# Patient Record
Sex: Male | Born: 1965 | Race: Black or African American | Hispanic: No | Marital: Married | Smoking: Former smoker
Health system: Southern US, Community
[De-identification: ages and names within clinical notes are randomized; demographics above are authoritative.]

## PROBLEM LIST (undated history)

## (undated) DIAGNOSIS — K219 Gastro-esophageal reflux disease without esophagitis: Secondary | ICD-10-CM

## (undated) DIAGNOSIS — Z5189 Encounter for other specified aftercare: Secondary | ICD-10-CM

## (undated) DIAGNOSIS — M1990 Unspecified osteoarthritis, unspecified site: Secondary | ICD-10-CM

## (undated) DIAGNOSIS — I10 Essential (primary) hypertension: Secondary | ICD-10-CM

## (undated) DIAGNOSIS — E119 Type 2 diabetes mellitus without complications: Secondary | ICD-10-CM

## (undated) DIAGNOSIS — K279 Peptic ulcer, site unspecified, unspecified as acute or chronic, without hemorrhage or perforation: Secondary | ICD-10-CM

## (undated) DIAGNOSIS — IMO0001 Reserved for inherently not codable concepts without codable children: Secondary | ICD-10-CM

## (undated) HISTORY — PX: CERVICAL FUSION: SHX112

## (undated) HISTORY — DX: Unspecified osteoarthritis, unspecified site: M19.90

## (undated) HISTORY — PX: HAMMER TOE SURGERY: SHX385

## (undated) HISTORY — PX: SPINE SURGERY: SHX786

## (undated) HISTORY — PX: ANTERIOR CERVICAL DECOMP/DISCECTOMY FUSION: SHX1161

## (undated) HISTORY — PX: BUNIONECTOMY: SHX129

## (undated) HISTORY — PX: CARPAL TUNNEL RELEASE: SHX101

## (undated) HISTORY — DX: Peptic ulcer, site unspecified, unspecified as acute or chronic, without hemorrhage or perforation: K27.9

---

## 2004-09-02 ENCOUNTER — Ambulatory Visit: Payer: Self-pay | Admitting: Endocrinology

## 2006-08-03 ENCOUNTER — Ambulatory Visit: Payer: Self-pay | Admitting: Physician Assistant

## 2006-10-13 ENCOUNTER — Ambulatory Visit: Payer: Self-pay | Admitting: Unknown Physician Specialty

## 2007-02-22 ENCOUNTER — Ambulatory Visit: Payer: Self-pay | Admitting: Physician Assistant

## 2007-12-27 ENCOUNTER — Ambulatory Visit: Payer: Self-pay | Admitting: Physician Assistant

## 2010-11-23 ENCOUNTER — Emergency Department: Payer: Self-pay | Admitting: Emergency Medicine

## 2010-11-26 ENCOUNTER — Emergency Department: Payer: Self-pay | Admitting: Internal Medicine

## 2011-11-18 ENCOUNTER — Emergency Department: Payer: Self-pay | Admitting: Emergency Medicine

## 2011-11-18 LAB — BASIC METABOLIC PANEL
BUN: 15 mg/dL (ref 7–18)
Calcium, Total: 8.5 mg/dL (ref 8.5–10.1)
EGFR (African American): >60
EGFR (Non-African Amer.): >60
Glucose: 105 mg/dL — ABNORMAL HIGH (ref 65–99)
Osmolality: 286 (ref 275–301)
Potassium: 3.8 mmol/L (ref 3.5–5.1)
Sodium: 143 mmol/L (ref 136–145)

## 2011-11-18 LAB — CBC
MCH: 32.9 pg (ref 26.0–34.0)
MCHC: 35.3 g/dL (ref 32.0–36.0)
Platelet: 254 10*3/uL (ref 150–440)
RDW: 12.8 % (ref 11.5–14.5)

## 2012-01-11 ENCOUNTER — Other Ambulatory Visit: Payer: Self-pay | Admitting: Neurological Surgery

## 2012-01-11 DIAGNOSIS — M25511 Pain in right shoulder: Secondary | ICD-10-CM

## 2012-01-11 DIAGNOSIS — M542 Cervicalgia: Secondary | ICD-10-CM

## 2012-01-17 ENCOUNTER — Other Ambulatory Visit: Payer: Self-pay

## 2012-01-17 ENCOUNTER — Inpatient Hospital Stay
Admission: RE | Admit: 2012-01-17 | Discharge: 2012-01-17 | Payer: Self-pay | Source: Ambulatory Visit | Attending: Neurological Surgery | Admitting: Neurological Surgery

## 2012-01-17 NOTE — Discharge Instructions (Signed)
Myelogram Discharge Instructions  1. Go home and rest quietly for the next 24 hours.  It is important to lie flat for the next 24 hours.  Get up only to go to the restroom.  You may lie in the bed or on a couch on your back, your stomach, your left side or your right side.  You may have one pillow under your head.  You may have pillows between your knees while you are on your side or under your knees while you are on your back.  2. DO NOT drive today.  Recline the seat as far back as it will go, while still wearing your seat belt, on the way home.  3. You may get up to go to the bathroom as needed.  You may sit up for 10 minutes to eat.  You may resume your normal diet and medications unless otherwise indicated.  Drink lots of extra fluids today and tomorrow.  4. The incidence of headache, nausea, or vomiting is about 5% (one in 20 patients).  If you develop a headache, lie flat and drink plenty of fluids until the headache goes away.  Caffeinated beverages may be helpful.  If you develop severe nausea and vomiting or a headache that does not go away with flat bed rest, call [phone].  5. You may resume normal activities after your 24 hours of bed rest is over; however, do not exert yourself strongly or do any heavy lifting tomorrow.  6. Call your physician for a follow-up appointment.  The results of your myelogram will be sent directly to your physician by the following day.  7. If you have any questions or if complications develop after you arrive home, please call [phone].  Discharge instructions have been explained to the patient.  The patient, or the person responsible for the patient, fully understands these instructions.

## 2012-01-28 ENCOUNTER — Ambulatory Visit
Admission: RE | Admit: 2012-01-28 | Discharge: 2012-01-28 | Disposition: A | Discharge status: Home / Self Care | Payer: BC Managed Care – PPO | Source: Ambulatory Visit | Attending: Neurological Surgery | Admitting: Neurological Surgery

## 2012-01-28 VITALS — BP 133/71 | HR 61 | Ht 71.0 in | Wt 190.0 lb

## 2012-01-28 DIAGNOSIS — M25511 Pain in right shoulder: Secondary | ICD-10-CM

## 2012-01-28 DIAGNOSIS — M542 Cervicalgia: Secondary | ICD-10-CM

## 2012-01-28 MED ORDER — MEPERIDINE HCL 100 MG/ML IJ SOLN
75.0000 mg | Freq: Once | INTRAMUSCULAR | Status: AC
  Administered 2012-01-28: 75 mg via INTRAMUSCULAR

## 2012-01-28 MED ORDER — ONDANSETRON HCL 4 MG/2ML IJ SOLN
4.0000 mg | Freq: Once | INTRAMUSCULAR | Status: AC
  Administered 2012-01-28: 4 mg via INTRAMUSCULAR

## 2012-01-28 MED ORDER — DIAZEPAM 5 MG PO TABS
10.0000 mg | ORAL_TABLET | Freq: Once | ORAL | Status: AC
  Administered 2012-01-28: 10 mg via ORAL

## 2012-01-28 MED ORDER — IOHEXOL 300 MG/ML  SOLN: 10.0000 mL | Freq: Once | INTRAMUSCULAR | Status: AC | PRN

## 2012-01-28 NOTE — Discharge Instructions (Signed)

## 2012-01-28 NOTE — Progress Notes (Signed)
Patient resting quietly without complaint with wife at bedside in nursing station after myelogram.  Tolerating snack well.  jkl

## 2012-02-11 ENCOUNTER — Other Ambulatory Visit: Payer: Self-pay | Admitting: Neurological Surgery

## 2012-03-02 ENCOUNTER — Encounter (HOSPITAL_COMMUNITY): Payer: Self-pay | Admitting: Pharmacy Technician

## 2012-03-08 ENCOUNTER — Inpatient Hospital Stay (HOSPITAL_COMMUNITY): Admission: RE | Admit: 2012-03-08 | Discharge: 2012-03-08 | Payer: BC Managed Care – PPO | Source: Ambulatory Visit

## 2012-03-08 ENCOUNTER — Encounter (HOSPITAL_COMMUNITY): Payer: Self-pay

## 2012-03-08 HISTORY — DX: Encounter for other specified aftercare: Z51.89

## 2012-03-08 HISTORY — DX: Gastro-esophageal reflux disease without esophagitis: K21.9

## 2012-03-08 HISTORY — DX: Reserved for inherently not codable concepts without codable children: IMO0001

## 2012-03-08 NOTE — Pre-Procedure Instructions (Signed)
20 Urias Sheek  03/08/2012   Your procedure is scheduled on: 03-16-2012 @7 :30 AM Report to [facility] at5:30 AM AM.  Call this number if you have problems the morning of surgery: [phone]   Remember:   Do not eat food:After Midnight.  May have clear liquids: up to 4 Hours before arrival.until 1:30 AM  Clear liquids include soda, tea, black coffee, apple or grape juice, broth.  Take these medicines the morning of surgery with A SIP OF WATER:   Do not wear jewelry, make-up or nail polish.  Do not wear lotions, powders, or perfumes. You may wear deodorant.  Do not shave 48 hours prior to surgery. Men may shave face and neck.  Do not bring valuables to the hospital.  Contacts, dentures or bridgework may not be worn into surgery.  Leave suitcase in the car. After surgery it may be brought to your room.  For patients admitted to the hospital, checkout time is 11:00 AM the day of discharge.      Special Instructions: CHG Shower Use Special Wash: 1/2 bottle night before surgery and 1/2 bottle morning of surgery.   Please read over the following fact sheets that you were given: Pain Booklet, Coughing and Deep Breathing, MRSA Information and Surgical Site Infection Prevention

## 2012-03-08 NOTE — Progress Notes (Signed)
EKG,CXR requested from [facility], Stress test,ECHO requested from Dr. Eric Form  Phone [phone]

## 2012-03-09 ENCOUNTER — Encounter (HOSPITAL_COMMUNITY)
Admission: RE | Admit: 2012-03-09 | Discharge: 2012-03-09 | Disposition: A | Discharge status: Home / Self Care | Payer: BC Managed Care – PPO | Source: Ambulatory Visit | Attending: Neurological Surgery | Admitting: Neurological Surgery

## 2012-03-09 LAB — BASIC METABOLIC PANEL
Calcium: 9.5 mg/dL (ref 8.4–10.5)
GFR calc Af Amer: >90 mL/min (ref >90)
GFR calc non Af Amer: >90 mL/min (ref >90)
Potassium: 4.2 mEq/L (ref 3.5–5.1)
Sodium: 141 mEq/L (ref 135–145)

## 2012-03-09 LAB — CBC
Hemoglobin: 15.2 g/dL (ref 13.0–17.0)
MCH: 32.2 pg (ref 26.0–34.0)
Platelets: 249 10*3/uL (ref 150–400)
RBC: 4.72 MIL/uL (ref 4.22–5.81)
WBC: 5.6 10*3/uL (ref 4.0–10.5)

## 2012-03-09 LAB — SURGICAL PCR SCREEN: Staphylococcus aureus: NEGATIVE

## 2012-03-09 LAB — PROTIME-INR
INR: 1 (ref 0.00–1.49)
Prothrombin Time: 13.4 seconds (ref 11.6–15.2)

## 2012-03-10 NOTE — Consult Note (Signed)
Anesthesia:  Patient is a 46 year old male scheduled for C6-7 posterior cervical fusion on 03/16/12.  History includes former smoker, GERD, blood transfusion, prior cervical fusion.    Labs noted.    EKG from 03/09/12 showed NSR, right BBB.  He has been evaluated by Cardiologist Dr. Corky Downs in [place] in the past, last visit 12/28/11.  According to 04/28/11 stress test comments, he had a known right BBB at that time.  He had a exercise stress test on 04/28/11 that was negative for angina, arrhythmia, and ischemic ST changes.  Echo on 04/14/11 showed LV/RV dimensions, wall thickness, and global function WNL, LA/RA normal, no significant valvular disease.  Plan to proceed.  Shonna Chock, PA-C

## 2012-03-15 MED ORDER — CEFAZOLIN SODIUM-DEXTROSE 2-3 GM-% IV SOLR
2.0000 g | INTRAVENOUS | Status: AC
  Administered 2012-03-16: 2 g via INTRAVENOUS
  Filled 2012-03-15: qty 50

## 2012-03-15 MED ORDER — DEXAMETHASONE SODIUM PHOSPHATE 10 MG/ML IJ SOLN
10.0000 mg | INTRAMUSCULAR | Status: AC
  Administered 2012-03-16: 10 mg via INTRAVENOUS
  Filled 2012-03-15: qty 1

## 2012-03-15 MED ORDER — CEFAZOLIN SODIUM 1-5 GM-% IV SOLN: 1.0000 g | INTRAVENOUS | Status: DC

## 2012-03-16 ENCOUNTER — Ambulatory Visit (HOSPITAL_COMMUNITY): Payer: BC Managed Care – PPO | Admitting: Vascular Surgery

## 2012-03-16 ENCOUNTER — Ambulatory Visit (HOSPITAL_COMMUNITY): Payer: BC Managed Care – PPO

## 2012-03-16 ENCOUNTER — Encounter (HOSPITAL_COMMUNITY)
Admission: RE | Disposition: A | Discharge status: Home / Self Care | Payer: Self-pay | Source: Ambulatory Visit | Attending: Neurological Surgery

## 2012-03-16 ENCOUNTER — Ambulatory Visit (HOSPITAL_COMMUNITY)
Admission: RE | Admit: 2012-03-16 | Discharge: 2012-03-17 | Disposition: A | Discharge status: Home / Self Care | Payer: BC Managed Care – PPO | Source: Ambulatory Visit | Attending: Neurological Surgery | Admitting: Neurological Surgery

## 2012-03-16 ENCOUNTER — Encounter (HOSPITAL_COMMUNITY): Payer: Self-pay | Admitting: *Deleted

## 2012-03-16 ENCOUNTER — Encounter (HOSPITAL_COMMUNITY): Payer: Self-pay | Admitting: Vascular Surgery

## 2012-03-16 DIAGNOSIS — Z01810 Encounter for preprocedural cardiovascular examination: Secondary | ICD-10-CM | POA: Insufficient documentation

## 2012-03-16 DIAGNOSIS — Z01818 Encounter for other preprocedural examination: Secondary | ICD-10-CM | POA: Insufficient documentation

## 2012-03-16 DIAGNOSIS — T84498A Other mechanical complication of other internal orthopedic devices, implants and grafts, initial encounter: Secondary | ICD-10-CM | POA: Insufficient documentation

## 2012-03-16 DIAGNOSIS — K219 Gastro-esophageal reflux disease without esophagitis: Secondary | ICD-10-CM | POA: Insufficient documentation

## 2012-03-16 DIAGNOSIS — Y838 Other surgical procedures as the cause of abnormal reaction of the patient, or of later complication, without mention of misadventure at the time of the procedure: Secondary | ICD-10-CM | POA: Insufficient documentation

## 2012-03-16 HISTORY — PX: POSTERIOR CERVICAL FUSION/FORAMINOTOMY: SHX5038

## 2012-03-16 SURGERY — POSTERIOR CERVICAL FUSION/FORAMINOTOMY LEVEL 1
Anesthesia: General | Site: Neck | Wound class: Clean

## 2012-03-16 MED ORDER — ROCURONIUM BROMIDE 100 MG/10ML IV SOLN
INTRAVENOUS | Status: DC | PRN
  Administered 2012-03-16: 50 mg via INTRAVENOUS
  Administered 2012-03-16: 10 mg via INTRAVENOUS

## 2012-03-16 MED ORDER — HYDROMORPHONE HCL PF 1 MG/ML IJ SOLN
INTRAMUSCULAR | Status: AC
  Filled 2012-03-16: qty 1

## 2012-03-16 MED ORDER — ACETAMINOPHEN 650 MG RE SUPP: 650.0000 mg | RECTAL | Status: DC | PRN

## 2012-03-16 MED ORDER — MENTHOL 3 MG MT LOZG: 1.0000 | LOZENGE | OROMUCOSAL | Status: DC | PRN

## 2012-03-16 MED ORDER — CEFAZOLIN SODIUM 1-5 GM-% IV SOLN
1.0000 g | Freq: Three times a day (TID) | INTRAVENOUS | Status: AC
  Administered 2012-03-16 (×2): 1 g via INTRAVENOUS
  Filled 2012-03-16 (×2): qty 50

## 2012-03-16 MED ORDER — CYCLOBENZAPRINE HCL 10 MG PO TABS
10.0000 mg | ORAL_TABLET | Freq: Three times a day (TID) | ORAL | Status: DC | PRN
  Administered 2012-03-16 – 2012-03-17 (×3): 10 mg via ORAL
  Filled 2012-03-16 (×5): qty 1

## 2012-03-16 MED ORDER — PROPOFOL 10 MG/ML IV EMUL
INTRAVENOUS | Status: DC | PRN
  Administered 2012-03-16: 150 mg via INTRAVENOUS
  Administered 2012-03-16: 50 mg via INTRAVENOUS

## 2012-03-16 MED ORDER — SODIUM CHLORIDE 0.9 % IJ SOLN
3.0000 mL | Freq: Two times a day (BID) | INTRAMUSCULAR | Status: DC
  Administered 2012-03-16 – 2012-03-17 (×2): 3 mL via INTRAVENOUS

## 2012-03-16 MED ORDER — SODIUM CHLORIDE 0.9 % IR SOLN
Status: DC | PRN
  Administered 2012-03-16: 09:00:00

## 2012-03-16 MED ORDER — MEPERIDINE HCL 25 MG/ML IJ SOLN: 6.2500 mg | INTRAMUSCULAR | Status: DC | PRN

## 2012-03-16 MED ORDER — PROMETHAZINE HCL 25 MG/ML IJ SOLN: 6.2500 mg | INTRAMUSCULAR | Status: DC | PRN

## 2012-03-16 MED ORDER — FENTANYL CITRATE 0.05 MG/ML IJ SOLN
INTRAMUSCULAR | Status: DC | PRN
  Administered 2012-03-16 (×2): 100 ug via INTRAVENOUS
  Administered 2012-03-16 (×3): 50 ug via INTRAVENOUS
  Administered 2012-03-16: 100 ug via INTRAVENOUS

## 2012-03-16 MED ORDER — MIDAZOLAM HCL 2 MG/2ML IJ SOLN
INTRAMUSCULAR | Status: AC
  Filled 2012-03-16: qty 2

## 2012-03-16 MED ORDER — SODIUM CHLORIDE 0.9 % IJ SOLN: 3.0000 mL | INTRAMUSCULAR | Status: DC | PRN

## 2012-03-16 MED ORDER — DEXAMETHASONE SODIUM PHOSPHATE 4 MG/ML IJ SOLN
4.0000 mg | Freq: Four times a day (QID) | INTRAMUSCULAR | Status: DC
  Administered 2012-03-16 – 2012-03-17 (×2): 4 mg via INTRAVENOUS
  Filled 2012-03-16 (×6): qty 1

## 2012-03-16 MED ORDER — HEMOSTATIC AGENTS (NO CHARGE) OPTIME
TOPICAL | Status: DC | PRN
  Administered 2012-03-16: 1 via TOPICAL

## 2012-03-16 MED ORDER — LACTATED RINGERS IV SOLN
INTRAVENOUS | Status: DC | PRN
  Administered 2012-03-16 (×2): via INTRAVENOUS

## 2012-03-16 MED ORDER — MIDAZOLAM HCL 2 MG/2ML IJ SOLN
0.5000 mg | Freq: Once | INTRAMUSCULAR | Status: AC | PRN
  Administered 2012-03-16: 1 mg via INTRAVENOUS

## 2012-03-16 MED ORDER — CELECOXIB 200 MG PO CAPS
200.0000 mg | ORAL_CAPSULE | Freq: Two times a day (BID) | ORAL | Status: DC
  Administered 2012-03-16 – 2012-03-17 (×3): 200 mg via ORAL
  Filled 2012-03-16 (×4): qty 1

## 2012-03-16 MED ORDER — NEOSTIGMINE METHYLSULFATE 1 MG/ML IJ SOLN
INTRAMUSCULAR | Status: DC | PRN
  Administered 2012-03-16: 4 mg via INTRAVENOUS

## 2012-03-16 MED ORDER — HYDROCODONE-ACETAMINOPHEN 5-325 MG PO TABS
1.0000 | ORAL_TABLET | ORAL | Status: DC | PRN
  Administered 2012-03-16 – 2012-03-17 (×4): 2 via ORAL
  Filled 2012-03-16 (×4): qty 2

## 2012-03-16 MED ORDER — ONDANSETRON HCL 4 MG/2ML IJ SOLN: 4.0000 mg | INTRAMUSCULAR | Status: DC | PRN

## 2012-03-16 MED ORDER — HYDROMORPHONE HCL PF 1 MG/ML IJ SOLN
0.5000 mg | INTRAMUSCULAR | Status: DC | PRN
  Administered 2012-03-16 (×3): 0.5 mg via INTRAVENOUS

## 2012-03-16 MED ORDER — ZOLPIDEM TARTRATE 5 MG PO TABS: 10.0000 mg | ORAL_TABLET | Freq: Every evening | ORAL | Status: DC | PRN

## 2012-03-16 MED ORDER — ACETAMINOPHEN 325 MG PO TABS
650.0000 mg | ORAL_TABLET | ORAL | Status: DC | PRN
  Filled 2012-03-16: qty 2

## 2012-03-16 MED ORDER — SODIUM CHLORIDE 0.9 % IV SOLN: 250.0000 mL | INTRAVENOUS | Status: DC

## 2012-03-16 MED ORDER — ONDANSETRON HCL 4 MG/2ML IJ SOLN
INTRAMUSCULAR | Status: DC | PRN
  Administered 2012-03-16: 4 mg via INTRAVENOUS

## 2012-03-16 MED ORDER — LIDOCAINE HCL (CARDIAC) 20 MG/ML IV SOLN
INTRAVENOUS | Status: DC | PRN
  Administered 2012-03-16: 40 mg via INTRAVENOUS

## 2012-03-16 MED ORDER — MIDAZOLAM HCL 5 MG/5ML IJ SOLN
INTRAMUSCULAR | Status: DC | PRN
  Administered 2012-03-16: 2 mg via INTRAVENOUS

## 2012-03-16 MED ORDER — HYDROMORPHONE HCL PF 1 MG/ML IJ SOLN
0.5000 mg | INTRAMUSCULAR | Status: DC | PRN
  Administered 2012-03-16 (×3): 1 mg via INTRAVENOUS
  Filled 2012-03-16 (×3): qty 1

## 2012-03-16 MED ORDER — BUPIVACAINE HCL (PF) 0.25 % IJ SOLN
INTRAMUSCULAR | Status: DC | PRN
  Administered 2012-03-16: 5 mL

## 2012-03-16 MED ORDER — GLYCOPYRROLATE 0.2 MG/ML IJ SOLN
INTRAMUSCULAR | Status: DC | PRN
  Administered 2012-03-16: .8 mg via INTRAVENOUS

## 2012-03-16 MED ORDER — POTASSIUM CHLORIDE IN NACL 20-0.9 MEQ/L-% IV SOLN
INTRAVENOUS | Status: DC
  Filled 2012-03-16 (×4): qty 1000

## 2012-03-16 MED ORDER — BACITRACIN 50000 UNITS IM SOLR
INTRAMUSCULAR | Status: AC
  Filled 2012-03-16: qty 1

## 2012-03-16 MED ORDER — DEXAMETHASONE 4 MG PO TABS
4.0000 mg | ORAL_TABLET | Freq: Four times a day (QID) | ORAL | Status: DC
  Administered 2012-03-16 – 2012-03-17 (×3): 4 mg via ORAL
  Filled 2012-03-16 (×5): qty 1

## 2012-03-16 MED ORDER — HYDROMORPHONE HCL PF 1 MG/ML IJ SOLN
0.2500 mg | INTRAMUSCULAR | Status: DC | PRN
  Administered 2012-03-16 (×4): 0.5 mg via INTRAVENOUS

## 2012-03-16 MED ORDER — THROMBIN 5000 UNITS EX KIT
PACK | CUTANEOUS | Status: DC | PRN
  Administered 2012-03-16 (×2): 5000 [IU] via TOPICAL

## 2012-03-16 MED ORDER — SENNA 8.6 MG PO TABS
1.0000 | ORAL_TABLET | Freq: Two times a day (BID) | ORAL | Status: DC
  Administered 2012-03-16 – 2012-03-17 (×3): 8.6 mg via ORAL
  Filled 2012-03-16 (×4): qty 1

## 2012-03-16 MED ORDER — PHENOL 1.4 % MT LIQD: 1.0000 | OROMUCOSAL | Status: DC | PRN

## 2012-03-16 MED ORDER — SODIUM CHLORIDE 0.9 % IV SOLN
INTRAVENOUS | Status: AC
  Filled 2012-03-16: qty 500

## 2012-03-16 MED ORDER — 0.9 % SODIUM CHLORIDE (POUR BTL) OPTIME
TOPICAL | Status: DC | PRN
  Administered 2012-03-16: 1000 mL

## 2012-03-16 SURGICAL SUPPLY — 55 items
BAG DECANTER FOR FLEXI CONT (MISCELLANEOUS) ×2 IMPLANT
BENZOIN TINCTURE PRP APPL 2/3 (GAUZE/BANDAGES/DRESSINGS) ×2 IMPLANT
BIT DRILL MOUNTAINER FXED 12MM (DRILL) ×1 IMPLANT
BLADE SURG ROTATE 9660 (MISCELLANEOUS) IMPLANT
BUR MATCHSTICK NEURO 3.0 LAGG (BURR) IMPLANT
CANISTER SUCTION 2500CC (MISCELLANEOUS) ×2 IMPLANT
CLOTH BEACON ORANGE TIMEOUT ST (SAFETY) ×2 IMPLANT
CONT SPEC 4OZ CLIKSEAL STRL BL (MISCELLANEOUS) ×2 IMPLANT
DRAPE C-ARM 42X72 X-RAY (DRAPES) ×4 IMPLANT
DRAPE LAPAROTOMY 100X72 PEDS (DRAPES) ×2 IMPLANT
DRAPE POUCH INSTRU U-SHP 10X18 (DRAPES) ×2 IMPLANT
DRESSING TELFA 8X3 (GAUZE/BANDAGES/DRESSINGS) ×2 IMPLANT
DRILL MOUNTAINEER FIXED 12MM (DRILL) ×2
DRSG OPSITE 4X5.5 SM (GAUZE/BANDAGES/DRESSINGS) ×2 IMPLANT
DURAPREP 26ML APPLICATOR (WOUND CARE) ×2 IMPLANT
ELECT REM PT RETURN 9FT ADLT (ELECTROSURGICAL) ×2
ELECTRODE REM PT RTRN 9FT ADLT (ELECTROSURGICAL) ×1 IMPLANT
EVACUATOR 1/8 PVC DRAIN (DRAIN) IMPLANT
GAUZE SPONGE 4X4 16PLY XRAY LF (GAUZE/BANDAGES/DRESSINGS) IMPLANT
GLOVE BIO SURGEON STRL SZ8 (GLOVE) ×2 IMPLANT
GLOVE BIOGEL PI IND STRL 7.0 (GLOVE) ×1 IMPLANT
GLOVE BIOGEL PI IND STRL 7.5 (GLOVE) ×1 IMPLANT
GLOVE BIOGEL PI INDICATOR 7.0 (GLOVE) ×1
GLOVE BIOGEL PI INDICATOR 7.5 (GLOVE) ×1
GLOVE SURG SS PI 7.0 STRL IVOR (GLOVE) ×4 IMPLANT
GLOVE SURG SS PI 7.5 STRL IVOR (GLOVE) ×4 IMPLANT
GOWN BRE IMP SLV AUR LG STRL (GOWN DISPOSABLE) IMPLANT
GOWN BRE IMP SLV AUR XL STRL (GOWN DISPOSABLE) IMPLANT
GOWN STRL REIN 2XL LVL4 (GOWN DISPOSABLE) IMPLANT
HEMOSTAT POWDER KIT SURGIFOAM (HEMOSTASIS) IMPLANT
KIT BASIN OR (CUSTOM PROCEDURE TRAY) ×2 IMPLANT
KIT ROOM TURNOVER OR (KITS) ×2 IMPLANT
MARKER SKIN DUAL TIP RULER LAB (MISCELLANEOUS) ×2 IMPLANT
NEEDLE HYPO 25X1 1.5 SAFETY (NEEDLE) ×2 IMPLANT
NEEDLE SPNL 20GX3.5 QUINCKE YW (NEEDLE) ×2 IMPLANT
NS IRRIG 1000ML POUR BTL (IV SOLUTION) ×2 IMPLANT
PACK LAMINECTOMY NEURO (CUSTOM PROCEDURE TRAY) ×2 IMPLANT
PAD ARMBOARD 7.5X6 YLW CONV (MISCELLANEOUS) ×2 IMPLANT
PIN MAYFIELD SKULL DISP (PIN) ×2 IMPLANT
PUTTY BONE DBX 5CC MIX (Putty) ×2 IMPLANT
ROD MOUNTAINEER 3.5X60 (Rod) ×2 IMPLANT
SCREW F A 3.5X14 (Screw) ×8 IMPLANT
SCREW INNER (Screw) ×8 IMPLANT
SPONGE GAUZE 4X4 12PLY (GAUZE/BANDAGES/DRESSINGS) IMPLANT
SPONGE LAP 4X18 X RAY DECT (DISPOSABLE) IMPLANT
SPONGE SURGIFOAM ABS GEL SZ50 (HEMOSTASIS) ×2 IMPLANT
STRIP CLOSURE SKIN 1/2X4 (GAUZE/BANDAGES/DRESSINGS) ×2 IMPLANT
SUT VIC AB 0 CT1 18XCR BRD8 (SUTURE) ×1 IMPLANT
SUT VIC AB 0 CT1 8-18 (SUTURE) ×1
SUT VIC AB 2-0 CP2 18 (SUTURE) ×2 IMPLANT
SUT VIC AB 3-0 SH 8-18 (SUTURE) ×2 IMPLANT
SYR 20ML ECCENTRIC (SYRINGE) ×2 IMPLANT
TOWEL OR 17X24 6PK STRL BLUE (TOWEL DISPOSABLE) IMPLANT
TOWEL OR 17X26 10 PK STRL BLUE (TOWEL DISPOSABLE) IMPLANT
WATER STERILE IRR 1000ML POUR (IV SOLUTION) ×2 IMPLANT

## 2012-03-16 NOTE — Anesthesia Preprocedure Evaluation (Addendum)
Anesthesia Evaluation  Patient identified by MRN, date of birth, ID band Patient awake    Reviewed: Allergy & Precautions, H&P , NPO status , Patient's Chart, lab work & pertinent test results  History of Anesthesia Complications Negative for: history of anesthetic complications  Airway Mallampati: II TM Distance: >3 FB Neck ROM: Limited    Dental  (+) Dental Advisory Given and Edentulous Upper   Pulmonary former smoker breath sounds clear to auscultation  Pulmonary exam normal       Cardiovascular negative cardio ROS  Rhythm:Regular Rate:Normal  Hx of RBBB- good EF, Hx of Neg stress   Neuro/Psych negative psych ROS   GI/Hepatic Neg liver ROS, GERD-  Controlled and Medicated,  Endo/Other  negative endocrine ROS  Renal/GU negative Renal ROS  negative genitourinary   Musculoskeletal negative musculoskeletal ROS (+)   Abdominal   Peds  Hematology negative hematology ROS (+)   Anesthesia Other Findings   Reproductive/Obstetrics                          Anesthesia Physical Anesthesia Plan  ASA: II  Anesthesia Plan: General   Post-op Pain Management:    Induction: Intravenous  Airway Management Planned: Oral ETT  Additional Equipment:   Intra-op Plan:   Post-operative Plan: Extubation in OR  Informed Consent:   Dental advisory given  Plan Discussed with: CRNA, Anesthesiologist and Surgeon  Anesthesia Plan Comments: (Plan routine monitors, GETA)       Anesthesia Quick Evaluation

## 2012-03-16 NOTE — H&P (Signed)
Subjective:   Patient is a 46 y.o. male admitted for PCF. The patient first presented to me with complaints of neck and arm pain. Onset of symptoms was many months ago. He is status post ACDF elsewhere. The pain is described as aching and occurs most of the time. The pain is rated moderate to severe, and is located at the base of te neck and shoulders and radiates to the arms. The symptoms have been progressive. Symptoms are exacerbated by neck movement, and are relieved by meds.  Previous work up includes CT which revealed a pseudoarthrosis at the previous ACDF site.  Past Medical History  Diagnosis Date  . Blood transfusion   . GERD (gastroesophageal reflux disease)     Past Surgical History  Procedure Date  . Hammer toe surgery   . Bunionectomy   . Cervical fusion   . Carpal tunnel release     right    Allergies  Allergen Reactions  . Aspirin Nausea Only    History  Substance Use Topics  . Smoking status: Former Smoker -- 0.4 packs/day for 18 years    Types: Cigarettes    Quit date: 01/27/2006  . Smokeless tobacco: Never Used  . Alcohol Use: Yes     occasionally    History reviewed. No pertinent family history. Prior to Admission medications   Medication Sig Start Date End Date Taking? Authorizing Provider  HYDROcodone-acetaminophen (NORCO) 5-325 MG per tablet Take 1 tablet by mouth every 6 (six) hours as needed. For pain   Yes Historical Provider, MD  ranitidine (ZANTAC) 150 MG tablet Take 150 mg by mouth daily.   Yes Historical Provider, MD     Review of Systems  Positive ROS: neg  All other systems have been reviewed and were otherwise negative with the exception of those mentioned in the HPI and as above.  Objective: Vital signs in last 24 hours: Temp:  [97.8 F (36.6 C)] 97.8 F (36.6 C) (05/30 0631) Pulse Rate:  [49] 49  (05/30 0631) Resp:  [20] 20  (05/30 0631) BP: (109)/(64) 109/64 mmHg (05/30 0631) SpO2:  [98 %] 98 % (05/30 0631)  General Appearance:  Alert, cooperative, no distress, appears stated age Head: Normocephalic, without obvious abnormality, atraumatic Eyes: PERRL, conjunctiva/corneas clear, EOM's intact, fundi benign, both eyes      Ears: Normal TM's and external ear canals, both ears Throat: Lips, mucosa, and tongue normal; teeth and gums normal Neck: Supple, symmetrical, trachea midline, no adenopathy; thyroid: No enlargement/tenderness/nodules; no carotid bruit or JVD Back: Symmetric, no curvature, ROM normal, no CVA tenderness Lungs: Clear to auscultation bilaterally, respirations unlabored Heart: Regular rate and rhythm, S1 and S2 normal, no murmur, rub or gallop Abdomen: Soft, non-tender, bowel sounds active all four quadrants, no masses, no organomegaly Extremities: Extremities normal, atraumatic, no cyanosis or edema Pulses: 2+ and symmetric all extremities Skin: Skin color, texture, turgor normal, no rashes or lesions  NEUROLOGIC:  Mental status: Alert and oriented x4, no aphasia, good attention span, fund of knowledge and memory  Motor Exam - grossly normal Sensory Exam - grossly normal Reflexes: 1+ Coordination - grossly normal Gait - grossly normal Balance - grossly normal Cranial Nerves: I: smell Not tested  II: visual acuity  OS: nl    OD: nl  II: visual fields Full to confrontation  II: pupils Equal, round, reactive to light  III,VII: ptosis None  III,IV,VI: extraocular muscles  Full ROM  V: mastication Normal  V: facial light touch sensation  Normal  V,VII: corneal reflex  Present  VII: facial muscle function - upper  Normal  VII: facial muscle function - lower Normal  VIII: hearing Not tested  IX: soft palate elevation  Normal  IX,X: gag reflex Present  XI: trapezius strength  5/5  XI: sternocleidomastoid strength 5/5  XI: neck flexion strength  5/5  XII: tongue strength  Normal    Data Review Lab Results  Component Value Date   WBC 5.6 03/09/2012   HGB 15.2 03/09/2012   HCT 42.8 03/09/2012    MCV 90.7 03/09/2012   PLT 249 03/09/2012   Lab Results  Component Value Date   NA 141 03/09/2012   K 4.2 03/09/2012   CL 105 03/09/2012   CO2 28 03/09/2012   BUN 8 03/09/2012   CREATININE 0.90 03/09/2012   GLUCOSE 127* 03/09/2012   Lab Results  Component Value Date   INR 1.00 03/09/2012    Assessment:   Cervical neck pain with 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 Patient has failed conservative therapy. Planned surgery : PCF  Plan:   I explained the condition and procedure to the patient and answered any questions.  Patient wishes to proceed with procedure as planned. Understands risks/ benefits/ and expected or typical outcomes.  Angelie Kram S 03/16/2012 6:57 AM

## 2012-03-16 NOTE — Preoperative (Signed)
Beta Blockers   Reason not to administer Beta Blockers:Not Applicable 

## 2012-03-16 NOTE — Op Note (Signed)
03/16/2012  9:03 AM  PATIENT:  Eric Owen  46 y.o. 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 He developed recurrent neck and arm pain. CT myelogram showed a pseudoarthrosis at C6-7. It was some mild adjacent level disease at C5-6 fell was noncompressive. Recommended a posterior cervical fusion at C6-7. Patient understood the risks, benefits, and alternatives and potential outcomes and wished to proceed.  PROCEDURE DETAILS: The patient was brought to the operating room. Generalized endotracheal anesthesia was induced. The patient was affixed a 3 point [place] headrest and rolled into the prone position on chest rolls. All pressure points were padded. The posterior cervical region was cleaned and prepped with DuraPrep and then draped in the usual sterile fashion. 7 cc of local anesthesia was injected and a dorsal midline incision made in the posterior cervical region and carried down to the cervical fascia. The fascia was opened and the paraspinous musculature was taken down to expose C6-7. Intraoperative fluoroscopy confirmed my level and then the dissection was carried out over the lateral facets. I localized the midpoint of each lateral mass and marked a region 1 mm medial to the midpoint of the lateral mass, and then drilled in an upper and outward direction into the safe zone of each lateral mass. I drilled to a depth of 12 mm and then checked my drill hole with a ball probe. I  then placed a 14 mm lateral mass screws into the safe zone of each lateral mass until they were 2 fingers tight.  I then decorticated the lateral masses and the facet joints and packed them with  morcellized allograft to perform arthrodesis from C6-7. I then placed rods into the multiaxial screw heads of the screws and locked these in position with the locking caps and anti-torque device. I then checked the final construct with fluoroscopy. I irrigated with saline solution containing bacitracin. After hemostasis was achieved a closed the muscle and the fascia with 0 Vicryl, subcutaneous tissue with 2-0 Vicryl, and the subcuticular tissue with 3-0 Vicryl. The skin was closed with benzoin and Steri-Strips. A sterile dressing was applied, the patient was turned to the supine physician and taken out of the headrest, awakened from general anesthesia and transferred to the recovery room in stable condition. At the end of the procedure all sponge, needle and instrument counts were correct.  PLAN OF CARE: Admit for overnight observation  PATIENT DISPOSITION:  PACU - hemodynamically stable.   Delay start of Pharmacological VTE agent (>24hrs) due to surgical blood loss or risk of bleeding:  yes

## 2012-03-16 NOTE — Anesthesia Postprocedure Evaluation (Signed)
  Anesthesia Post-op Note  Patient: Eric Owen  Procedure(s) Performed: Procedure(s) (LRB): POSTERIOR CERVICAL FUSION/FORAMINOTOMY LEVEL 1 (N/A)  Patient Location: PACU  Anesthesia Type: General  Level of Consciousness: sedated  Airway and Oxygen Therapy: Patient Spontanous Breathing and Patient connected to nasal cannula oxygen  Post-op Pain: mild  Post-op Assessment: Post-op Vital signs reviewed, Patient's Cardiovascular Status Stable, Respiratory Function Stable, Patent Airway, No signs of Nausea or vomiting and Pain level controlled  Post-op Vital Signs: Reviewed and stable  Complications: No apparent anesthesia complications

## 2012-03-16 NOTE — Transfer of Care (Signed)
Immediate Anesthesia Transfer of Care Note  Patient: Eric Owen  Procedure(s) Performed: Procedure(s) (LRB): POSTERIOR CERVICAL FUSION/FORAMINOTOMY LEVEL 1 (N/A)  Patient Location: PACU  Anesthesia Type: General  Level of Consciousness: awake and alert   Airway & Oxygen Therapy: Patient Spontanous Breathing and Patient connected to nasal cannula oxygen  Post-op Assessment: Report given to PACU RN and Post -op Vital signs reviewed and stable  Post vital signs: Reviewed  Complications: No apparent anesthesia complications and Patient re-intubated

## 2012-03-17 ENCOUNTER — Encounter (HOSPITAL_COMMUNITY): Payer: Self-pay | Admitting: Neurological Surgery

## 2012-03-17 MED ORDER — CYCLOBENZAPRINE HCL 10 MG PO TABS: 10.0000 mg | ORAL_TABLET | Freq: Three times a day (TID) | ORAL | Status: AC | PRN

## 2012-03-17 MED ORDER — HYDROCODONE-ACETAMINOPHEN 5-325 MG PO TABS: 1.0000 | ORAL_TABLET | Freq: Four times a day (QID) | ORAL | Status: AC | PRN

## 2012-03-17 NOTE — Plan of Care (Signed)
Problem: Consults Goal: Diagnosis - Spinal Surgery Cervical Spine Fusion     

## 2012-03-17 NOTE — Plan of Care (Signed)
Problem: Consults Goal: Diagnosis - Spinal Surgery Outcome: Completed/Met Date Met:  03/17/12 Cervical Spine Fusion     

## 2012-03-17 NOTE — Progress Notes (Signed)
Discharge instructions given and reviewed with patient and family member. Iv discontinued and patient discharged home with family. 

## 2012-03-17 NOTE — Discharge Instructions (Signed)
Wound Care Keep incision covered and dry for one week.  If you shower prior to then, cover incision with plastic wrap.  You may remove outer bandage after one week and shower.  Do not put any creams, lotions, or ointments on incision. Leave steri-strips on neck.  They will fall off by themselves. Activity Walk each and every day, increasing distance each day. No lifting greater than 8 lbs.  Avoid excessive neck motion. No driving for 2 weeks; may ride as a passenger locally.  Diet Resume your normal diet.  Return to Work Will be discussed at you follow up appointment. Call Your Doctor If Any of These Occur Redness, drainage, or swelling at the wound.  Temperature greater than 101 degrees. Severe pain not relieved by pain medication. Increased difficulty swallowing.  Incision starts to come apart. Follow Up Appt Call today for appointment in 1-2 weeks ([phone]) or for problems.  If you have any hardware placed in your spine, you will need an x-ray before your appointment.

## 2012-03-17 NOTE — Discharge Summary (Signed)
BP 149/68  Pulse 65  Temp(Src) 98 F (36.7 C) (Oral)  Resp 16  SpO2 96%  Admitting [id number] C6/7 Discharge dx: Psuedoarthrosis C6/7 PROCEDURE: Posterior cervical arthrodesis C6-7 utilizing morcellized allograft and nonsegmental fixation utilizing Mountaineer lateral mass screws Dest: Home Surgeon: Marikay Alar Complications: None Status: Alive and well Pt at discharge is alert, oriented x 4, with clear and fluent speech. Perrl, full eom 5/5 strength in upper and lower extremities Wound dressing intact. No signs of infection +void, walk, and tolerate a regular diet.  Given instructions for followup. Meds Norco

## 2013-05-22 DIAGNOSIS — G8929 Other chronic pain: Secondary | ICD-10-CM | POA: Insufficient documentation

## 2013-05-22 DIAGNOSIS — M5030 Other cervical disc degeneration, unspecified cervical region: Secondary | ICD-10-CM | POA: Insufficient documentation

## 2013-08-23 ENCOUNTER — Emergency Department: Payer: Self-pay | Admitting: Emergency Medicine

## 2013-08-23 LAB — BASIC METABOLIC PANEL
Chloride: 107 mmol/L (ref 98–107)
Osmolality: 277 (ref 275–301)
Potassium: 3.9 mmol/L (ref 3.5–5.1)

## 2013-09-20 DIAGNOSIS — G8929 Other chronic pain: Secondary | ICD-10-CM | POA: Insufficient documentation

## 2013-09-20 DIAGNOSIS — M545 Low back pain, unspecified: Secondary | ICD-10-CM | POA: Insufficient documentation

## 2013-10-29 ENCOUNTER — Ambulatory Visit: Payer: Self-pay | Admitting: Internal Medicine

## 2014-01-02 DIAGNOSIS — M546 Pain in thoracic spine: Secondary | ICD-10-CM | POA: Insufficient documentation

## 2014-02-20 DIAGNOSIS — M5412 Radiculopathy, cervical region: Secondary | ICD-10-CM | POA: Insufficient documentation

## 2014-07-10 DIAGNOSIS — M5412 Radiculopathy, cervical region: Secondary | ICD-10-CM | POA: Insufficient documentation

## 2014-07-10 DIAGNOSIS — M546 Pain in thoracic spine: Secondary | ICD-10-CM | POA: Insufficient documentation

## 2014-07-10 DIAGNOSIS — M545 Low back pain, unspecified: Secondary | ICD-10-CM | POA: Insufficient documentation

## 2014-07-10 DIAGNOSIS — G8929 Other chronic pain: Secondary | ICD-10-CM | POA: Insufficient documentation

## 2015-01-22 ENCOUNTER — Encounter: Payer: Self-pay | Admitting: General Surgery

## 2015-01-22 ENCOUNTER — Ambulatory Visit (INDEPENDENT_AMBULATORY_CARE_PROVIDER_SITE_OTHER): Payer: BLUE CROSS/BLUE SHIELD | Admitting: General Surgery

## 2015-01-22 VITALS — BP 100/58 | HR 60 | Resp 12 | Ht 71.5 in | Wt 195.0 lb

## 2015-01-22 DIAGNOSIS — K5900 Constipation, unspecified: Secondary | ICD-10-CM

## 2015-01-22 DIAGNOSIS — K625 Hemorrhage of anus and rectum: Secondary | ICD-10-CM

## 2015-01-22 MED ORDER — HYDROCORTISONE 1 % EX CREA: 1.0000 "application " | TOPICAL_CREAM | Freq: Two times a day (BID) | CUTANEOUS | Status: DC

## 2015-01-22 NOTE — Progress Notes (Signed)
Patient ID: Eric Quaker., male   DOB: Sep 07, 1966, 49 y.o.   MRN: [id number]  Chief Complaint  Patient presents with  . Rectal Bleeding    HPI Eric Owen. is a 49 y.o. male.  Here today for evaluation of rectal bleeding. He states about 2 months ago he had a constipated BM that triggered some rectal bleeding. He does have a BM daily and takes stool softeners. He has not noticed any bleeding since the stool softeners for about 2 weeks. He denies any pain. He does state that he has hard BM and it is hard for his bowels to move. No family history of cancer.  HPI  Past Medical History  Diagnosis Date  . Blood transfusion   . GERD (gastroesophageal reflux disease)   . Arthritis   . Peptic ulcer     Past Surgical History  Procedure Laterality Date  . Hammer toe surgery    . Bunionectomy    . Cervical fusion    . Carpal tunnel release      right  . Posterior cervical fusion/foraminotomy  03/16/2012    Procedure: POSTERIOR CERVICAL FUSION/FORAMINOTOMY LEVEL 1;  Surgeon: Tia Alert, MD;  Location: [facility];  Service: Neurosurgery;  Laterality: N/A;  Cervical six-seven posterior cervical fusion with lateral mass screws    History reviewed. No pertinent family history.  Social History History  Substance Use Topics  . Smoking status: Former Smoker -- 0.40 packs/day for 18 years    Types: Cigarettes    Quit date: 01/27/2006  . Smokeless tobacco: Never Used  . Alcohol Use: Yes     Comment: occasionally    Allergies  Allergen Reactions  . Aspirin Nausea Only    Current Outpatient Prescriptions  Medication Sig Dispense Refill  . fenofibrate 160 MG tablet Take 160 mg by mouth daily.   0  . lisinopril (PRINIVIL,ZESTRIL) 20 MG tablet Take 10 mg by mouth daily.   0  . RA VITAMIN D-3 1000 UNITS tablet Take 1,000 Units by mouth daily.   0  . HYDROcodone-acetaminophen (NORCO) 5-325 MG per tablet Take 1 tablet by mouth every 6 (six) hours as needed. For pain    .  hydrocortisone cream 1 % Apply 1 application topically 2 (two) times daily. 30 g 0  . ranitidine (ZANTAC) 150 MG tablet Take 150 mg by mouth daily.     No current facility-administered medications for this visit.    Review of Systems Review of Systems  Constitutional: Negative.   Respiratory: Negative.   Cardiovascular: Negative.   Gastrointestinal: Positive for constipation, anal bleeding and rectal pain. Negative for diarrhea.    Blood pressure 100/58, pulse 60, resp. rate 12, height 5' 11.5" (1.816 m), weight 195 lb (88.451 kg).  Physical Exam Physical Exam  Constitutional: He is oriented to person, place, and time. He appears well-developed and well-nourished.  Eyes: Conjunctivae are normal. No scleral icterus.  Neck: Neck supple.  Cardiovascular: Normal rate, regular rhythm and normal heart sounds.   Pulmonary/Chest: Effort normal and breath sounds normal.  Abdominal: Soft. Normal appearance and bowel sounds are normal. There is no tenderness. No hernia.  Genitourinary: Rectal exam shows anal tone abnormal.  Increased sphincterl tone   Lymphadenopathy:    He has no cervical adenopathy.  Neurological: He is alert and oriented to person, place, and time.  Skin: Skin is warm and dry.   Anoscopy was performed and showed a small internal hemorrhoid at 6 o'cl location. No bleeding noted.  Data Reviewed none  Assessment    Internal hemorrhoid.Pt states he has trouble with evacuation often. This may be from the very tight sphincter tone.    Plan   Pt advised.  Nupericane ointment BID. If no improvement will recommend colonoscopy Follow up in 2 weeks.     PCP/Ref:  Ave FilterMasoud, Javed  SANKAR,SEEPLAPUTHUR G 01/22/2015, 6:39 PM

## 2015-01-22 NOTE — Patient Instructions (Signed)
The patient is aware to call back for any questions or concerns.  

## 2015-02-05 ENCOUNTER — Encounter: Payer: Self-pay | Admitting: General Surgery

## 2015-02-05 ENCOUNTER — Ambulatory Visit (INDEPENDENT_AMBULATORY_CARE_PROVIDER_SITE_OTHER): Payer: BLUE CROSS/BLUE SHIELD | Admitting: General Surgery

## 2015-02-05 VITALS — BP 112/58 | HR 68 | Resp 12 | Ht 71.0 in | Wt 194.0 lb

## 2015-02-05 DIAGNOSIS — Z1211 Encounter for screening for malignant neoplasm of colon: Secondary | ICD-10-CM

## 2015-02-05 DIAGNOSIS — K625 Hemorrhage of anus and rectum: Secondary | ICD-10-CM

## 2015-02-05 MED ORDER — POLYETHYLENE GLYCOL 3350 17 GM/SCOOP PO POWD: ORAL | Status: DC

## 2015-02-05 NOTE — Patient Instructions (Addendum)
The patient is aware to call back for any questions or concerns.  Colonoscopy A colonoscopy is an exam to look at the entire large intestine (colon). This exam can help find problems such as tumors, polyps, inflammation, and areas of bleeding. The exam takes about 1 hour.  LET [facility] CARE PROVIDER KNOW ABOUT:   Any allergies you have.  All medicines you are taking, including vitamins, herbs, eye drops, creams, and over-the-counter medicines.  Previous problems you or members of your family have had with the use of anesthetics.  Any blood disorders you have.  Previous surgeries you have had.  Medical conditions you have. RISKS AND COMPLICATIONS  Generally, this is a safe procedure. However, as with any procedure, complications can occur. Possible complications include:  Bleeding.  Tearing or rupture of the colon wall.  Reaction to medicines given during the exam.  Infection (rare). BEFORE THE PROCEDURE   Ask your health care provider about changing or stopping your regular medicines.  You may be prescribed an oral bowel prep. This involves drinking a large amount of medicated liquid, starting the day before your procedure. The liquid will cause you to have multiple loose stools until your stool is almost clear or light green. This cleans out your colon in preparation for the procedure.  Do not eat or drink anything else once you have started the bowel prep, unless your health care provider tells you it is safe to do so.  Arrange for someone to drive you home after the procedure. PROCEDURE   You will be given medicine to help you relax (sedative).  You will lie on your side with your knees bent.  A long, flexible tube with a light and camera on the end (colonoscope) will be inserted through the rectum and into the colon. The camera sends video back to a computer screen as it moves through the colon. The colonoscope also releases carbon dioxide gas to inflate the colon.  This helps your health care provider see the area better.  During the exam, your health care provider may take a small tissue sample (biopsy) to be examined under a microscope if any abnormalities are found.  The exam is finished when the entire colon has been viewed. AFTER THE PROCEDURE   Do not drive for 24 hours after the exam.  You may have a small amount of blood in your stool.  You may pass moderate amounts of gas and have mild abdominal cramping or bloating. This is caused by the gas used to inflate your colon during the exam.  Ask when your test results will be ready and how you will get your results. Make sure you get your test results. Document Released: 10/01/2000 Document Revised: 07/25/2013 Document Reviewed: 06/11/2013 [facility] Patient Information 2015 [place], [place]. This information is not intended to replace advice given to you by your health care provider. Make sure you discuss any questions you have with your health care provider.  [facility]

## 2015-02-05 NOTE — Progress Notes (Signed)
This is a 48 year old male here today for his two week follow up from rectal bleeding. Patient states he much better. Only one episode of bleeding with BM since his last viisit. He does state that his bowels are moving more easily after the use of the cream.  Rectal exam normal. Neck supple, no masses/nodes Heart normal. Lungs clear. Abdomen soft and non tender.   Impression: Rectal symptoms improved. Discussed screening colonoscopy and pt is agreeable.  Colonoscopy with possible biopsy/polypectomy prn: Information regarding the procedure, including its potential risks and complications (including but not limited to perforation of the bowel, which may require emergency surgery to repair, and bleeding) was verbally given to the patient. Educational information regarding lower instestinal endoscopy was given to the patient. Written instructions for how to complete the bowel prep using Miralax were provided. The importance of drinking ample fluids to avoid dehydration as a result of the prep emphasized.  Patient has been scheduled for a colonoscopy on 03-04-15 at [facility].   PCP:  Masoud, Javed 

## 2015-02-09 NOTE — Consult Note (Signed)
PATIENT NAME:  Eric Owen, MINSHALL MR#:  [id number] DATE OF BIRTH:  01/03/1966  DATE OF CONSULTATION:  11/19/2011  REFERRING PHYSICIAN:  Dr. Manson Passey CONSULTING PHYSICIAN:  Darrick Meigs, MD  PRIMARY CARE PHYSICIAN: Corky Downs, MD  REASON FOR CONSULTATION: Evaluate for admission.  CHIEF COMPLAINT: Chest pain.   HISTORY OF PRESENT ILLNESS: The patient is a 49 year old male with past medical history of vitamin D deficiency, hypertriglyceridemia, occasional indigestion/gastroesophageal reflux disease for which he takes as needed Zantac who was at work yesterday afternoon when he developed sudden onset of left lower chest pain which radiated into his upper chest. It was described as sharp with no radiation. The patient reports this is the first time he has had chest pain like this. He denies any trauma or heavy moving, lifting, or pushing. He does have history of acid reflux and had eaten some spaghetti earlier in the day; however, he felt that the pain was not like his usual acid reflux pain. On examination, the patient has tenderness to palpation on his lower rib cage. He denies any nausea, vomiting, or diaphoresis. It does hurt him to take a deep breath. The patient's d-dimer and chest x-ray were completely normal. The patient has been seeing Dr. Corky Downs for the past six months. He had a stress test about 6 months ago with Dr. Corky Downs, which was completely normal.  ALLERGIES: Aspirin. The patient reports that he gets bad gastritis from continued use of aspirin; however, he does not have a true allergy.   MEDICATIONS:  1. Fenofibrate 145 mg daily.  2. Vitamin D monthly. 3. Zantac as needed.   PAST MEDICAL HISTORY:  1. Hypertriglyceridemia.  2. Vitamin D deficiency. 3. Occasional heartburn/acid reflux.  PAST SURGICAL HISTORY: Cervical fusion.   FAMILY HISTORY: Father is deceased. He does not know if his father had any medical problems. Mother has a pacemaker and defibrillator, has  end-stage renal disease and is on dialysis. She also has hypertension. There is no family history of premature cardiac disease/coronary artery disease/myocardial infarction.   SOCIAL HISTORY: The patient quit smoking in 2007. He used to smoke one pack over a week. Occasional alcohol use, maybe once a month. Denies any drug abuse. He works as a Armed forces training and education officer. He is married and lives with his wife.   REVIEW OF SYSTEMS: CONSTITUTIONAL: The patient denies any fever, fatigue, or weakness. EYES: Denies any blurred or double vision. ENT: Denies any tinnitus or ear pain. RESPIRATORY: Reports painful respiration. Denies any cough, wheezing. CARDIOVASCULAR: Denies any palpitations or tachycardia. GI: Denies any nausea, vomiting, diarrhea, or abdominal pain. GU: Denies any polyuria or nocturia. HEME/LYMPH: Denies anemia or easy bruisability. MUSCULOSKELETAL: Denies any gout or swelling. NEUROLOGICAL: Denies any fainting spells, seizures, OR weakness. SKIN: Denies any acne or rashes. PSYCH: Denies any anxiety or depression.   PHYSICAL EXAMINATION:   GENERAL: The patient is a young 49 year old African American male lying comfortably in bed, not in acute distress pain.   HEAD: Atraumatic, normocephalic.   EYES: No pallor, icterus, or cyanosis. Pupils are equally round and reactive to light and accommodation. Extraocular movements intact.   ENT: Wet mucous membranes. No oropharyngeal erythema or thrush.   NECK: Supple. No masses. No JVD. No thyromegaly or lymphadenopathy.   CHEST WALL: There is tenderness to palpation on his left lower rib cage. Not using accessory muscles of respiration. No intercostal muscle retractions.   LUNGS: Bilaterally clear to auscultation. No wheezing, rales, or rhonchi.   CARDIOVASCULAR: S1 and S2 regular.  No murmur, rubs, or gallops.   ABDOMEN: Soft, nontender, and nondistended. No guarding or rigidity. No organomegaly.   SKIN: No rashes or lesions.   PERIPHERIES:  No pedal edema. 2+ pedal pulses.   MUSCULOSKELETAL: No cyanosis or clubbing.   NEUROLOGICAL: Awake, alert, and oriented x3. Nonfocal neurological exam. Cranial nerves grossly intact.   PSYCH: Normal mood and affect.   RESULTS: EKG shows sinus bradycardia with right bundle branch block.   White count 6.0, hemoglobin 13.7, hematocrit 38.7, platelets 254. CK 307. Cardiac enzymes x2 are negative. Glucose 105, BUN 15, creatinine 1.06, sodium 143, potassium 3.8, chloride 105, CO2 26, anion gap 2.   ASSESSMENT: 1. Possible musculoskeletal pain.  2. Hypertriglyceridemia.   PLAN: The patient is presenting with signs and symptoms of musculoskeletal pain. He reports pain and tenderness to palpation in his left lower rib cage with taking a deep breath. His d-dimer is normal. His chest x-ray shows no evidence of any infiltrate. Clinically he does not appear to have any infection. The patient also had a stress test about 6 months ago by Dr. Corky DownsJaved Masoud, which was reported as normal. The patient denies any history of premature coronary artery disease. The patient was offered admission to the hospital versus discharge and followup with his primary care physician. The patient wishes to be discharged home and follow-up with his  physician. The patient has been recommended to follow-up with Dr. Juel BurrowMasoud right away. He will be prescribed ibuprofen, soma, and omeprazole. He has been advised to avoid any heavy lifting and pushing.   TIME SPENT IN CONSULTATION: 75 minutes.  ____________________________ Darrick MeigsSangeeta Bradford Cazier, MD sp:slb D: 11/19/2011 09:40:03 ET T: 11/19/2011 13:29:59 ET JOB#: [id number]  cc: Darrick MeigsSangeeta Rahn Lacuesta, MD, <Dictator> Corky DownsJaved Masoud, MD Darrick MeigsSANGEETA Edgard Debord MD ELECTRONICALLY SIGNED 11/19/2011 14:35

## 2015-02-27 ENCOUNTER — Other Ambulatory Visit: Payer: Self-pay | Admitting: General Surgery

## 2015-02-27 DIAGNOSIS — K625 Hemorrhage of anus and rectum: Secondary | ICD-10-CM

## 2015-02-27 DIAGNOSIS — Z1211 Encounter for screening for malignant neoplasm of colon: Secondary | ICD-10-CM

## 2015-03-04 ENCOUNTER — Ambulatory Visit
Admission: RE | Admit: 2015-03-04 | Discharge: 2015-03-04 | Disposition: A | Discharge status: Home / Self Care | Payer: BLUE CROSS/BLUE SHIELD | Source: Ambulatory Visit | Attending: General Surgery | Admitting: General Surgery

## 2015-03-04 ENCOUNTER — Ambulatory Visit: Payer: BLUE CROSS/BLUE SHIELD | Admitting: Anesthesiology

## 2015-03-04 ENCOUNTER — Encounter
Admission: RE | Disposition: A | Discharge status: Home / Self Care | Payer: Self-pay | Source: Ambulatory Visit | Attending: General Surgery

## 2015-03-04 DIAGNOSIS — K625 Hemorrhage of anus and rectum: Secondary | ICD-10-CM | POA: Diagnosis present

## 2015-03-04 DIAGNOSIS — K219 Gastro-esophageal reflux disease without esophagitis: Secondary | ICD-10-CM | POA: Insufficient documentation

## 2015-03-04 DIAGNOSIS — Z87891 Personal history of nicotine dependence: Secondary | ICD-10-CM | POA: Diagnosis not present

## 2015-03-04 DIAGNOSIS — Z79899 Other long term (current) drug therapy: Secondary | ICD-10-CM | POA: Diagnosis not present

## 2015-03-04 DIAGNOSIS — I10 Essential (primary) hypertension: Secondary | ICD-10-CM | POA: Diagnosis not present

## 2015-03-04 DIAGNOSIS — Z1211 Encounter for screening for malignant neoplasm of colon: Secondary | ICD-10-CM | POA: Diagnosis not present

## 2015-03-04 DIAGNOSIS — K5730 Diverticulosis of large intestine without perforation or abscess without bleeding: Secondary | ICD-10-CM

## 2015-03-04 HISTORY — PX: COLONOSCOPY: SHX5424

## 2015-03-04 SURGERY — COLONOSCOPY
Anesthesia: General

## 2015-03-04 MED ORDER — LACTATED RINGERS IV SOLN
INTRAVENOUS | Status: DC
  Administered 2015-03-04: 1000 mL via INTRAVENOUS

## 2015-03-04 MED ORDER — LACTATED RINGERS IV SOLN: INTRAVENOUS | Status: DC

## 2015-03-04 MED ORDER — LIDOCAINE HCL (PF) 2 % IJ SOLN
INTRAMUSCULAR | Status: DC | PRN
  Administered 2015-03-04: 60 mg via INTRADERMAL

## 2015-03-04 MED ORDER — PROPOFOL INFUSION 10 MG/ML OPTIME
INTRAVENOUS | Status: DC | PRN
  Administered 2015-03-04: 140 ug/kg/min via INTRAVENOUS

## 2015-03-04 MED ORDER — MIDAZOLAM HCL 2 MG/2ML IJ SOLN
INTRAMUSCULAR | Status: DC | PRN
  Administered 2015-03-04 (×2): 1 mg via INTRAVENOUS

## 2015-03-04 MED ORDER — FENTANYL CITRATE (PF) 100 MCG/2ML IJ SOLN
INTRAMUSCULAR | Status: DC | PRN
  Administered 2015-03-04: 50 ug via INTRAVENOUS

## 2015-03-04 MED ORDER — PROPOFOL 10 MG/ML IV BOLUS
INTRAVENOUS | Status: DC | PRN
  Administered 2015-03-04: 70 mg via INTRAVENOUS

## 2015-03-04 NOTE — Anesthesia Preprocedure Evaluation (Signed)
Anesthesia Evaluation  Patient identified by MRN, date of birth, ID band Patient awake    Reviewed: Allergy & Precautions, NPO status , Patient's Chart, lab work & pertinent test results  History of Anesthesia Complications Negative for: history of anesthetic complications  Airway Mallampati: II  TM Distance: >3 FB Neck ROM: Full    Dental  (+) Upper Dentures, Edentulous Upper   Pulmonary former smoker (quit x 9 yrs),          Cardiovascular hypertension, Pt. on medications     Neuro/Psych    GI/Hepatic PUD, GERD-  Medicated and Controlled,  Endo/Other    Renal/GU      Musculoskeletal   Abdominal   Peds  Hematology   Anesthesia Other Findings   Reproductive/Obstetrics                             Anesthesia Physical Anesthesia Plan  ASA: II  Anesthesia Plan: General   Post-op Pain Management:    Induction: Intravenous  Airway Management Planned: Nasal Cannula  Additional Equipment:   Intra-op Plan:   Post-operative Plan:   Informed Consent: I have reviewed the patients History and Physical, chart, labs and discussed the procedure including the risks, benefits and alternatives for the proposed anesthesia with the patient or authorized representative who has indicated his/her understanding and acceptance.     Plan Discussed with:   Anesthesia Plan Comments:         Anesthesia Quick Evaluation

## 2015-03-04 NOTE — Op Note (Signed)
[facility] Gastroenterology Patient Name: Eric CapriceRobert Owen Procedure Date: 03/04/2015 2:20 PM MRN: [id number] Account #: [id number] Date of Birth: 05-Jun-1966 Admit Type: Outpatient Age: 49 [facility] ROOM 1 Gender: Male Note Status: Finalized Procedure:         Colonoscopy Indications:       Screening for colorectal malignant neoplasm Providers:         Clovia CuffSeeplaputhr G. Sankar, MD Referring MD:      Corky DownsJaved Masoud, MD (Referring MD) Medicines:         General Anesthesia Complications:     No immediate complications. Procedure:         Pre-Anesthesia Assessment:                    - General anesthesia under the supervision of an                     anesthesiologist was determined to be medically necessary                     for this procedure based on review of the patient's                     medical history, medications, and prior anesthesia history.                    After obtaining informed consent, the colonoscope was                     passed under direct vision. Throughout the procedure, the                     patient's blood pressure, pulse, and oxygen saturations                     were monitored continuously. The Olympus PCF-160AL                     colonoscope (S#. [id number]) was introduced through the anus                     and advanced to the the cecum, identified by the ileocecal                     valve. The colonoscopy was performed without difficulty.                     The patient tolerated the procedure well. The quality of                     the bowel preparation was adequate to identify polyps 6 mm                     and larger in size. Findings:      The perianal and digital rectal examinations were normal.      A few small-mouthed diverticula were found in the proximal sigmoid colon.      The exam was otherwise without abnormality. Impression:        - Diverticulosis in the proximal sigmoid colon.                    - The  examination was otherwise normal.                    -  No specimens collected. Recommendation:    - Use fiber, for example Citrucel, Fibercon, Konsyl or                     Metamucil. Procedure Code(s): --- Professional ---                    [phone], Colonoscopy, flexible; diagnostic, including                     collection of specimen(s) by brushing or washing, when                     performed (separate procedure) Diagnosis Code(s): --- Professional ---                    Z12.11, Encounter for screening for malignant neoplasm of                     colon                    K57.30, Diverticulosis of large intestine without                     perforation or abscess without bleeding CPT copyright 2014 American Medical Association. All rights reserved. The codes documented in this report are preliminary and upon coder review may  be revised to meet current compliance requirements. Clovia CuffSeeplaputhr G Sankar, MD 03/04/2015 2:57:16 PM This report has been signed electronically. Number of Addenda: 0 Note Initiated On: 03/04/2015 2:20 PM Scope Withdrawal Time: 0 hours 5 minutes 2 seconds  Total Procedure Duration: 0 hours 22 minutes 54 seconds       [facility]

## 2015-03-04 NOTE — Transfer of Care (Signed)
Immediate Anesthesia Transfer of Care Note  Patient: Eric QuakerRobert Kanady Jr.  Procedure(s) Performed: Procedure(s): COLONOSCOPY (N/A)  Patient Location: PACU  Anesthesia Type:General  Level of Consciousness: sedated  Airway & Oxygen Therapy: Patient Spontanous Breathing and Patient connected to nasal cannula oxygen  Post-op Assessment: Report given to RN  Post vital signs: Reviewed and stable  Last Vitals:  Filed Vitals:   03/04/15 1500  BP: 104/65  Pulse: 72  Temp: 36.2 C  Resp: 17    Complications: No apparent anesthesia complications

## 2015-03-04 NOTE — Interval H&P Note (Signed)
History and Physical Interval Note:  03/04/2015 2:13 PM  Eric Quakerobert Bord Jr.  has presented today for surgery, with the diagnosis of screening  The various methods of treatment have been discussed with the patient and family. After consideration of risks, benefits and other options for treatment, the patient has consented to  Procedure(s): COLONOSCOPY (N/A) as a surgical intervention .  The patient's history has been reviewed, patient examined, no change in status, stable for surgery.  I have reviewed the patient's chart and labs.  Questions were answered to the patient's satisfaction.     [name]

## 2015-03-04 NOTE — H&P (View-Only) (Signed)
This is a 49 year old male here today for his two week follow up from rectal bleeding. Patient states he much better. Only one episode of bleeding with BM since his last viisit. He does state that his bowels are moving more easily after the use of the cream.  Rectal exam normal. Neck supple, no masses/nodes Heart normal. Lungs clear. Abdomen soft and non tender.   Impression: Rectal symptoms improved. Discussed screening colonoscopy and pt is agreeable.  Colonoscopy with possible biopsy/polypectomy prn: Information regarding the procedure, including its potential risks and complications (including but not limited to perforation of the bowel, which may require emergency surgery to repair, and bleeding) was verbally given to the patient. Educational information regarding lower instestinal endoscopy was given to the patient. Written instructions for how to complete the bowel prep using Miralax were provided. The importance of drinking ample fluids to avoid dehydration as a result of the prep emphasized.  Patient has been [facility].   PCP:  Corky DownsMasoud, Javed

## 2015-03-05 NOTE — Anesthesia Postprocedure Evaluation (Signed)
  Anesthesia Post-op Note  Patient: Eric QuakerRobert Cannady Jr.  Procedure(s) Performed: Procedure(s): COLONOSCOPY (N/A)  Anesthesia type:General  Patient location: PACU  Post pain: Pain level controlled  Post assessment: Post-op Vital signs reviewed, Patient's Cardiovascular Status Stable, Respiratory Function Stable, Patent Airway and No signs of Nausea or vomiting  Post vital signs: Reviewed and stable  Last Vitals:  Filed Vitals:   03/04/15 1540  BP: 122/79  Pulse: 60  Temp:   Resp: 18    Level of consciousness: awake, alert  and patient cooperative  Complications: No apparent anesthesia complications

## 2015-03-11 ENCOUNTER — Encounter: Payer: Self-pay | Admitting: General Surgery

## 2015-03-14 NOTE — Addendum Note (Signed)
Addendum  created 03/14/15 1035 by Yevette EdwardsJames G Adams, MD   Modules edited: Anesthesia Responsible Staff

## 2015-09-02 ENCOUNTER — Ambulatory Visit (INDEPENDENT_AMBULATORY_CARE_PROVIDER_SITE_OTHER): Payer: BLUE CROSS/BLUE SHIELD | Admitting: General Surgery

## 2015-09-02 ENCOUNTER — Encounter: Payer: Self-pay | Admitting: General Surgery

## 2015-09-02 VITALS — BP 122/68 | HR 72 | Resp 14 | Ht 71.5 in | Wt 200.0 lb

## 2015-09-02 DIAGNOSIS — K420 Umbilical hernia with obstruction, without gangrene: Secondary | ICD-10-CM | POA: Diagnosis not present

## 2015-09-02 NOTE — Progress Notes (Signed)
Patient ID: Eric Quakerobert Pau Jr., male   DOB: 04/14/1966, 49 y.o.   MRN: [id number]  Chief Complaint  Patient presents with  . Hernia    HPI Eric QuakerRobert Tupou Jr. is a 49 y.o. male.  Here today for evaluation of possible umbilical hernia. He states he noticed it late summer. No particular incident prior to noticing the bulge. He does states it is a little tender to touch. Bowels are regular and daily. I have reviewed the history of present illness with the patient. HPI  Past Medical History  Diagnosis Date  . Blood transfusion   . GERD (gastroesophageal reflux disease)   . Arthritis   . Peptic ulcer     Past Surgical History  Procedure Laterality Date  . Hammer toe surgery    . Bunionectomy    . Cervical fusion    . Carpal tunnel release      right  . Posterior cervical fusion/foraminotomy  03/16/2012    Procedure: POSTERIOR CERVICAL FUSION/FORAMINOTOMY LEVEL 1;  Surgeon: Tia Alertavid S Jones, MD;  Location: [facility] NEURO ORS;  Service: Neurosurgery;  Laterality: N/A;  Cervical six-seven posterior cervical fusion with lateral mass screws  . Colonoscopy N/A 03/04/2015    Procedure: COLONOSCOPY;  Surgeon: Kieth BrightlySeeplaputhur G Sankar, MD;  Location: [facility] ENDOSCOPY;  Service: Endoscopy;  Laterality: N/A;    History reviewed. No pertinent family history.  Social History Social History  Substance Use Topics  . Smoking status: Former Smoker -- 0.40 packs/day for 18 years    Types: Cigarettes    Quit date: 01/27/2006  . Smokeless tobacco: Never Used  . Alcohol Use: Yes     Comment: occasionally    Allergies  Allergen Reactions  . Aspirin Nausea Only    Current Outpatient Prescriptions  Medication Sig Dispense Refill  . docusate sodium (COLACE) 100 MG capsule Take 100 mg by mouth as needed for mild constipation.     . fenofibrate 160 MG tablet Take 160 mg by mouth daily.   0  . HYDROcodone-acetaminophen (NORCO) 5-325 MG per tablet Take 1 tablet by mouth every 6 (six) hours as needed. For pain    .  hydrocortisone cream 1 % Apply 1 application topically 2 (two) times daily. 30 g 0  . lisinopril (PRINIVIL,ZESTRIL) 20 MG tablet Take 20 mg by mouth daily.   0  . loratadine (CLARITIN) 10 MG tablet Take 10 mg by mouth daily as needed.  0  . RA VITAMIN D-3 1000 UNITS tablet Take 1,000 Units by mouth daily.   0  . ranitidine (ZANTAC) 150 MG tablet Take 150 mg by mouth daily.    Marland Kitchen. VIAGRA 50 MG tablet Take 1 tablet by mouth as needed.  0   No current facility-administered medications for this visit.    Review of Systems Review of Systems  Constitutional: Negative.   Respiratory: Negative.   Cardiovascular: Negative.     Blood pressure 122/68, pulse 72, resp. rate 14, height 5' 11.5" (1.816 m), weight 200 lb (90.719 kg).  Physical Exam Physical Exam  Constitutional: He is oriented to person, place, and time. He appears well-developed and well-nourished.  HENT:  Mouth/Throat: Oropharynx is clear and moist.  Eyes: Conjunctivae are normal. No scleral icterus.  Neck: Neck supple.  Cardiovascular: Normal rate, regular rhythm and normal heart sounds.   Pulmonary/Chest: Effort normal and breath sounds normal.  Abdominal: Soft. Normal appearance. There is tenderness. A hernia ( umbilical) is present. Hernia confirmed negative in the right inguinal area and confirmed negative in  the left inguinal area.  Small non-reducible umbilical hernia, moderately tender to palpation.    Lymphadenopathy:    He has no cervical adenopathy.  Neurological: He is alert and oriented to person, place, and time.  Skin: Skin is warm and dry.  Psychiatric: His behavior is normal.    Data Reviewed Progress notes reviewed  Assessment    Umbilical hernia, non-reducible-likely has a small part of omentum or preperitoneal fat incarcerated.. Patient is moderately symptomatic and at  risk for strangulation at this site. Repair is recommended    Plan    Hernia precautions and incarceration were discussed with the  patient. If they develop symptoms of an incarcerated hernia, they were encouraged to seek prompt medical attention.  I have recommended repair of the hernia using mesh on an outpatient basis in the near future. The risk of infection was reviewed. The role of prosthetic mesh to minimize the risk of recurrence was reviewed.     Patient is scheduled for surgery at [facility] on 10/15/15. He will pre admit by phone. He will be seen here for a short pre op visit with Dr Evette Cristal on 10/02/15. Patient is aware of dates, time, and instructions.    PCP:  Ave Filter 09/03/2015, 11:51 AM

## 2015-09-02 NOTE — Patient Instructions (Addendum)
The patient is aware to call back for any questions or concerns.  Hernia, Adult A hernia is the bulging of an organ or tissue through a weak spot in the muscles of the abdomen (abdominal wall). Hernias develop most often near the navel or groin. There are many kinds of hernias. Common kinds include:  Femoral hernia. This kind of hernia develops under the groin in the upper thigh area.  Inguinal hernia. This kind of hernia develops in the groin or scrotum.  Umbilical hernia. This kind of hernia develops near the navel.  Hiatal hernia. This kind of hernia causes part of the stomach to be pushed up into the chest.  Incisional hernia. This kind of hernia bulges through a scar from an abdominal surgery. CAUSES This condition may be caused by:  Heavy lifting.  Coughing over a long period of time.  Straining to have a bowel movement.  An incision made during an abdominal surgery.  A birth defect (congenital defect).  Excess weight or obesity.  Smoking.  Poor nutrition.  Cystic fibrosis.  Excess fluid in the abdomen.  Undescended testicles. SYMPTOMS Symptoms of a hernia include:  A lump on the abdomen. This is the first sign of a hernia. The lump may become more obvious with standing, straining, or coughing. It may get bigger over time if it is not treated or if the condition causing it is not treated.  Pain. A hernia is usually painless, but it may become painful over time if treatment is delayed. The pain is usually dull and may get worse with standing or lifting heavy objects. Sometimes a hernia gets tightly squeezed in the weak spot (strangulated) or stuck there (incarcerated) and causes additional symptoms. These symptoms may include:  Vomiting.  Nausea.  Constipation.  Irritability. DIAGNOSIS A hernia may be diagnosed with:  A physical exam. During the exam your health care provider may ask you to cough or to make a specific movement, because a hernia is usually  more visible when you move.  Imaging tests. These can include:  X-rays.  Ultrasound.  CT scan. TREATMENT A hernia that is small and painless may not need to be treated. A hernia that is large or painful may be treated with surgery. Inguinal hernias may be treated with surgery to prevent incarceration or strangulation. Strangulated hernias are always treated with surgery, because lack of blood to the trapped organ or tissue can cause it to die. Surgery to treat a hernia involves pushing the bulge back into place and repairing the weak part of the abdomen. HOME CARE INSTRUCTIONS  Avoid straining.  Do not lift anything heavier than 10 lb (4.5 kg).  Lift with your leg muscles, not your back muscles. This helps avoid strain.  When coughing, try to cough gently.  Prevent constipation. Constipation leads to straining with bowel movements, which can make a hernia worse or cause a hernia repair to break down. You can prevent constipation by:  Eating a high-fiber diet that includes plenty of fruits and vegetables.  Drinking enough fluids to keep your urine clear or pale yellow. Aim to drink 6-8 glasses of water per day.  Using a stool softener as directed by your health care provider.  Lose weight, if you are overweight.  Do not use any tobacco products, including cigarettes, chewing tobacco, or electronic cigarettes. If you need help quitting, ask your health care provider.  Keep all follow-up visits as directed by your health care provider. This is important. Your health care provider  may need to monitor your condition. SEEK MEDICAL CARE IF:  You have swelling, redness, and pain in the affected area.  Your bowel habits change. SEEK IMMEDIATE MEDICAL CARE IF:  You have a fever.  You have abdominal pain that is getting worse.  You feel nauseous or you vomit.  You cannot push the hernia back in place by gently pressing on it while you are lying down.  The hernia:  Changes in  shape or size.  Is stuck outside the abdomen.  Becomes discolored.  Feels hard or tender.   This information is not intended to replace advice given to you by your health care provider. Make sure you discuss any questions you have with your health care provider.   Document Released: 10/04/2005 Document Revised: 10/25/2014 Document Reviewed: 08/14/2014 Elsevier Interactive Patient Education [employer]2016 [employer]  [facility] He will pre admit by phone. He will be seen here for a short pre op visit with Dr Evette CristalSankar on 10/02/15. Patient is aware of dates, time, and instructions.

## 2015-09-03 ENCOUNTER — Encounter: Payer: Self-pay | Admitting: General Surgery

## 2015-10-02 ENCOUNTER — Ambulatory Visit (INDEPENDENT_AMBULATORY_CARE_PROVIDER_SITE_OTHER): Payer: BLUE CROSS/BLUE SHIELD | Admitting: General Surgery

## 2015-10-02 ENCOUNTER — Encounter: Payer: Self-pay | Admitting: General Surgery

## 2015-10-02 VITALS — BP 122/64 | HR 74 | Resp 12 | Ht 71.5 in | Wt 202.0 lb

## 2015-10-02 DIAGNOSIS — K420 Umbilical hernia with obstruction, without gangrene: Secondary | ICD-10-CM | POA: Diagnosis not present

## 2015-10-02 NOTE — Patient Instructions (Addendum)
The patient is aware to call back for any questions or concerns.  Patient's surgery has been scheduled for 10-29-15 at [facility].

## 2015-10-02 NOTE — Progress Notes (Signed)
Patient ID: Eric Owen., male   DOB: Jul 06, 1966, 49 y.o.   MRN: [id number]  Chief Complaint  Patient presents with  . Pre-op Exam    HPI Eric Owen. is a 49 y.o. male here today for his pre op umbilical hernia on 10-29-15. No new complaints. The hernia still bothers him from time to time. I have reviewed the history of present illness with the patient.  HPI  Past Medical History  Diagnosis Date  . Blood transfusion   . GERD (gastroesophageal reflux disease)   . Arthritis   . Peptic ulcer     Past Surgical History  Procedure Laterality Date  . Hammer toe surgery    . Bunionectomy    . Cervical fusion    . Carpal tunnel release      right  . Posterior cervical fusion/foraminotomy  03/16/2012    Procedure: POSTERIOR CERVICAL FUSION/FORAMINOTOMY LEVEL 1;  Surgeon: Eric Alert, MD;  Location: [facility];  Service: Neurosurgery;  Laterality: N/A;  Cervical six-seven posterior cervical fusion with lateral mass screws  . Colonoscopy N/A 03/04/2015    Procedure: COLONOSCOPY;  Surgeon: Eric Brightly, MD;  Location: [facility] ENDOSCOPY;  Service: Endoscopy;  Laterality: N/A;    History reviewed. No pertinent family history.  Social History Social History  Substance Use Topics  . Smoking status: Former Smoker -- 0.40 packs/day for 18 years    Types: Cigarettes    Quit date: 01/27/2006  . Smokeless tobacco: Never Used  . Alcohol Use: Yes     Comment: occasionally    Allergies  Allergen Reactions  . Aspirin Nausea Only    Current Outpatient Prescriptions  Medication Sig Dispense Refill  . docusate sodium (COLACE) 100 MG capsule Take 100 mg by mouth as needed for mild constipation.     . fenofibrate 160 MG tablet Take 160 mg by mouth daily.   0  . HYDROcodone-acetaminophen (NORCO) 5-325 MG per tablet Take 1 tablet by mouth every 6 (six) hours as needed. For pain    . hydrocortisone cream 1 % Apply 1 application topically 2 (two) times daily. 30 Owen 0  . lisinopril  (PRINIVIL,ZESTRIL) 20 MG tablet Take 20 mg by mouth daily.   0  . loratadine (CLARITIN) 10 MG tablet Take 10 mg by mouth daily as needed.  0  . RA VITAMIN D-3 1000 UNITS tablet Take 1,000 Units by mouth daily.   0  . ranitidine (ZANTAC) 150 MG tablet Take 150 mg by mouth daily.    Marland Kitchen VIAGRA 50 MG tablet Take 1 tablet by mouth as needed.  0   No current facility-administered medications for this visit.    Review of Systems Review of Systems  Constitutional: Negative.   Respiratory: Negative.   Cardiovascular: Negative.     Blood pressure 122/64, pulse 74, resp. rate 12, height 5' 11.5" (1.816 m), weight 202 lb (91.627 kg).  Physical Exam Physical Exam  Constitutional: He is oriented to person, place, and time. He appears well-developed and well-nourished.  HENT:  Mouth/Throat: Oropharynx is clear and moist.  Eyes: Conjunctivae are normal. No scleral icterus.  Neck: Neck supple.  Cardiovascular: Normal rate, regular rhythm and normal heart sounds.   Pulmonary/Chest: Effort normal and breath sounds normal.  Abdominal: Soft. Normal appearance. A hernia is present. Hernia confirmed negative in the right inguinal area and confirmed negative in the left inguinal area.  Partially reducible tender umbilical hernia.  Lymphadenopathy:    He has no cervical adenopathy.  Neurological: He is Owen and oriented to person, place, and time.  Skin: Skin is warm and dry.  Psychiatric: His behavior is normal.    Data Reviewed Progress notes.  Assessment    Umbilical hernia, partially reducuble, small, symptomatic. Proceed with repair as previously discussed.    Plan    Hernia precautions and incarceration were discussed with the patient. If they develop symptoms of an incarcerated hernia, they were encouraged to seek prompt medical attention.  I have recommended repair of the hernia possibly using mesh on an outpatient basis in the near future. The risk of infection was reviewed. The role  of prosthetic mesh to minimize the risk of recurrence was reviewed.   This information has been scribed by Eric Owen RNBC.   PCP:  Eric Owen, Eric  [name] Owen 10/02/2015, 9:43 AM

## 2015-10-07 ENCOUNTER — Other Ambulatory Visit: Payer: BLUE CROSS/BLUE SHIELD

## 2015-10-15 ENCOUNTER — Encounter: Admission: RE | Payer: Self-pay | Source: Ambulatory Visit

## 2015-10-15 ENCOUNTER — Ambulatory Visit
Admission: RE | Admit: 2015-10-15 | Payer: BLUE CROSS/BLUE SHIELD | Source: Ambulatory Visit | Admitting: General Surgery

## 2015-10-15 SURGERY — REPAIR, HERNIA, UMBILICAL, ADULT
Anesthesia: Choice

## 2015-10-22 ENCOUNTER — Other Ambulatory Visit: Payer: BLUE CROSS/BLUE SHIELD

## 2015-10-22 ENCOUNTER — Encounter: Payer: Self-pay | Admitting: *Deleted

## 2015-10-22 NOTE — Patient Instructions (Signed)
  Your procedure is scheduled on: 10-29-15 [facility](WEDNESDAY) Report to [facility] 2ND FLOOR To find out your arrival time please call [phone] between 1PM - 3PM on 10-28-15 (TUESDAY)  Remember: Instructions that are not followed completely may result in serious medical risk, up to and including death, or upon the discretion of your surgeon and anesthesiologist your surgery may need to be rescheduled.    _X___ 1. Do not eat food or drink liquids after midnight. No gum chewing or hard candies.     _X___ 2. No Alcohol for 24 hours before or after surgery.   ____ 3. Bring all medications with you on the day of surgery if instructed.    _X___ 4. Notify your doctor if there is any change in your medical condition     (cold, fever, infections).     Do not wear jewelry, make-up, hairpins, clips or nail polish.  Do not wear lotions, powders, or perfumes. You may wear deodorant.  Do not shave 48 hours prior to surgery. Men may shave face and neck.  Do not bring valuables to the hospital.    [facility] is not responsible for any belongings or valuables.               Contacts, dentures or bridgework may not be worn into surgery.  Leave your suitcase in the car. After surgery it may be brought to your room.  For patients admitted to the hospital, discharge time is determined by your treatment team.   Patients discharged the day of surgery will not be allowed to drive home.   Please read over the following fact sheets that you were given:    _X___ Take these medicines the morning of surgery with A SIP OF WATER:    1. LISINOPRIL  2. ZANTAC  3. TAKE AN EXTRA ZANTAC ON Tuesday NIGHT  4.  5.  6.  ____ Fleet Enema (as directed)   _X___ Use CHG Soap as directed  ____ Use inhalers on the day of surgery  ____ Stop metformin 2 days prior to surgery    ____ Take 1/2 of usual insulin dose the night before surgery and none on the morning of surgery.   ____ Stop  Coumadin/Plavix/aspirin-N/A  ____ Stop Anti-inflammatories-NO NSAIDS OR ASPIRIN PRODUCTS-HYDROCODONE OK/TYLENOL OK TO TAKE   ____ Stop supplements until after surgery.    ____ Bring C-Pap to the hospital.

## 2015-10-23 ENCOUNTER — Encounter
Admission: RE | Admit: 2015-10-23 | Discharge: 2015-10-23 | Disposition: A | Discharge status: Home / Self Care | Payer: BLUE CROSS/BLUE SHIELD | Source: Ambulatory Visit | Attending: General Surgery | Admitting: General Surgery

## 2015-10-23 DIAGNOSIS — I10 Essential (primary) hypertension: Secondary | ICD-10-CM | POA: Diagnosis not present

## 2015-10-23 DIAGNOSIS — Z01810 Encounter for preprocedural cardiovascular examination: Secondary | ICD-10-CM | POA: Diagnosis not present

## 2015-10-28 ENCOUNTER — Telehealth: Payer: Self-pay

## 2015-10-28 NOTE — Telephone Encounter (Signed)
Heather with Pre Admit [facility] at [facility] called and said that the patient had some changes on his EKG done on 10/23/15. Due to the weather the EKG was not read until Monday. She contacted the patient's PCP Dr Corky DownsJaved Masoud and due to changes he wants to see the patient for Medical Clearance. The patient is scheduled to see him on 10/31/15 for clearance. His surgery scheduled for 10/29/15 has been canceled until he is cleared. Herbert SetaHeather will contact the patient to let him know of the cancellation and his appointment with Dr Juel BurrowMasoud.

## 2015-10-29 ENCOUNTER — Ambulatory Visit
Admission: RE | Admit: 2015-10-29 | Payer: BLUE CROSS/BLUE SHIELD | Source: Ambulatory Visit | Admitting: General Surgery

## 2015-10-29 HISTORY — DX: Essential (primary) hypertension: I10

## 2015-10-29 SURGERY — REPAIR, HERNIA, UMBILICAL, ADULT
Anesthesia: Choice

## 2015-10-30 NOTE — Pre-Procedure Instructions (Signed)
[facility]N [facility] AND NOTIFIED HER THAT PT HAD AN ABNORMAL EKG WHICH I FAXED OVER TO DR Tilda FrancoMASOUDS OFFICE Tuesday MORNING TO LET HIM REVIEW SINCE EKG HAD CHANGED FROM PREVIOUS EKG (T WAVE INVERSION NOW EVIDENT).  DR Juel BurrowMASOUD DID NOT FEEL COMFORTABLE WITH PT HAVING HERNIA REPAIR WITH RECENT EKG CHANGES AND DR. MASOUD WANTS PT TO COME IN TO OFFICE TO BE CLEARED FOR SURGERY.  DR MASOUDS OFFICE WAS CLOSED ON Monday DUE TO THE SNOW AND THEY DID NOT OPEN UNTIL LATER MORNING Tuesday SO THAT WAS THE DELAY IN GETTING THE EKG TO HIS OFFICE.  PT HAS APPT WITH DR Juel BurrowMASOUD ON 10-31-15 @10  AM. PT NOTIFIED OF THIS AND THAT HIS SURGERY IS CANCELLED UNTIL CLEARANCE CAN BE OBTAINED.  PT VERBALIZED HE WOULD BE AT DR MASOUDS OFFICE ON Friday FOR HIS APPT.

## 2015-11-13 ENCOUNTER — Telehealth: Payer: Self-pay | Admitting: *Deleted

## 2015-11-13 NOTE — Telephone Encounter (Signed)
Message left on home and cell numbers for patient to call the office.  Patient's wife was contacted on 11-03-15 and made aware that we had received medical clearance from Dr. Juel Burrow. She was instructed to have patient call back to schedule pre-op visit with Dr. Evette Cristal and also reschedule patient's umbilical hernia repair. (Since that phone conversation we have not heard back from the patient.)

## 2015-12-04 NOTE — Telephone Encounter (Signed)
Another message has been left for patient to call the office.   We need to see if he is wanting to reschedule his surgery or put on hold for now.

## 2016-01-28 DIAGNOSIS — M542 Cervicalgia: Secondary | ICD-10-CM | POA: Diagnosis not present

## 2016-01-28 DIAGNOSIS — M5412 Radiculopathy, cervical region: Secondary | ICD-10-CM | POA: Diagnosis not present

## 2016-01-29 DIAGNOSIS — R7301 Impaired fasting glucose: Secondary | ICD-10-CM | POA: Diagnosis not present

## 2016-01-29 DIAGNOSIS — N529 Male erectile dysfunction, unspecified: Secondary | ICD-10-CM | POA: Diagnosis not present

## 2016-01-29 DIAGNOSIS — K219 Gastro-esophageal reflux disease without esophagitis: Secondary | ICD-10-CM | POA: Diagnosis not present

## 2016-01-29 DIAGNOSIS — Z125 Encounter for screening for malignant neoplasm of prostate: Secondary | ICD-10-CM | POA: Diagnosis not present

## 2016-01-29 DIAGNOSIS — R5381 Other malaise: Secondary | ICD-10-CM | POA: Diagnosis not present

## 2016-01-29 DIAGNOSIS — E784 Other hyperlipidemia: Secondary | ICD-10-CM | POA: Diagnosis not present

## 2016-01-29 DIAGNOSIS — E782 Mixed hyperlipidemia: Secondary | ICD-10-CM | POA: Diagnosis not present

## 2016-01-29 DIAGNOSIS — I10 Essential (primary) hypertension: Secondary | ICD-10-CM | POA: Diagnosis not present

## 2016-02-05 DIAGNOSIS — E782 Mixed hyperlipidemia: Secondary | ICD-10-CM | POA: Diagnosis not present

## 2016-02-05 DIAGNOSIS — R079 Chest pain, unspecified: Secondary | ICD-10-CM | POA: Diagnosis not present

## 2016-02-05 DIAGNOSIS — K219 Gastro-esophageal reflux disease without esophagitis: Secondary | ICD-10-CM | POA: Diagnosis not present

## 2016-02-10 DIAGNOSIS — E782 Mixed hyperlipidemia: Secondary | ICD-10-CM | POA: Diagnosis not present

## 2016-02-10 DIAGNOSIS — R079 Chest pain, unspecified: Secondary | ICD-10-CM | POA: Diagnosis not present

## 2016-02-10 DIAGNOSIS — K219 Gastro-esophageal reflux disease without esophagitis: Secondary | ICD-10-CM | POA: Diagnosis not present

## 2016-03-10 DIAGNOSIS — H1089 Other conjunctivitis: Secondary | ICD-10-CM | POA: Diagnosis not present

## 2016-03-10 DIAGNOSIS — H18832 Recurrent erosion of cornea, left eye: Secondary | ICD-10-CM | POA: Diagnosis not present

## 2016-03-10 DIAGNOSIS — S0502XA Injury of conjunctiva and corneal abrasion without foreign body, left eye, initial encounter: Secondary | ICD-10-CM | POA: Diagnosis not present

## 2016-03-11 DIAGNOSIS — S0502XD Injury of conjunctiva and corneal abrasion without foreign body, left eye, subsequent encounter: Secondary | ICD-10-CM | POA: Diagnosis not present

## 2016-03-12 DIAGNOSIS — S0502XD Injury of conjunctiva and corneal abrasion without foreign body, left eye, subsequent encounter: Secondary | ICD-10-CM | POA: Diagnosis not present

## 2016-03-12 DIAGNOSIS — H18832 Recurrent erosion of cornea, left eye: Secondary | ICD-10-CM | POA: Diagnosis not present

## 2016-03-16 DIAGNOSIS — S0502XS Injury of conjunctiva and corneal abrasion without foreign body, left eye, sequela: Secondary | ICD-10-CM | POA: Diagnosis not present

## 2016-03-25 DIAGNOSIS — Z23 Encounter for immunization: Secondary | ICD-10-CM | POA: Diagnosis not present

## 2016-04-12 DIAGNOSIS — N529 Male erectile dysfunction, unspecified: Secondary | ICD-10-CM | POA: Diagnosis not present

## 2016-04-12 DIAGNOSIS — E782 Mixed hyperlipidemia: Secondary | ICD-10-CM | POA: Diagnosis not present

## 2016-04-12 DIAGNOSIS — K219 Gastro-esophageal reflux disease without esophagitis: Secondary | ICD-10-CM | POA: Diagnosis not present

## 2016-04-12 DIAGNOSIS — Q7649 Other congenital malformations of spine, not associated with scoliosis: Secondary | ICD-10-CM | POA: Diagnosis not present

## 2016-04-19 DIAGNOSIS — M542 Cervicalgia: Secondary | ICD-10-CM | POA: Diagnosis not present

## 2016-04-19 DIAGNOSIS — M5412 Radiculopathy, cervical region: Secondary | ICD-10-CM | POA: Diagnosis not present

## 2016-05-11 DIAGNOSIS — M961 Postlaminectomy syndrome, not elsewhere classified: Secondary | ICD-10-CM | POA: Diagnosis not present

## 2016-05-11 DIAGNOSIS — M542 Cervicalgia: Secondary | ICD-10-CM | POA: Diagnosis not present

## 2016-05-11 DIAGNOSIS — M5412 Radiculopathy, cervical region: Secondary | ICD-10-CM | POA: Diagnosis not present

## 2016-06-14 DIAGNOSIS — M961 Postlaminectomy syndrome, not elsewhere classified: Secondary | ICD-10-CM | POA: Diagnosis not present

## 2016-06-14 DIAGNOSIS — Z6828 Body mass index (BMI) 28.0-28.9, adult: Secondary | ICD-10-CM | POA: Diagnosis not present

## 2016-06-14 DIAGNOSIS — M5412 Radiculopathy, cervical region: Secondary | ICD-10-CM | POA: Diagnosis not present

## 2016-06-14 DIAGNOSIS — M542 Cervicalgia: Secondary | ICD-10-CM | POA: Diagnosis not present

## 2016-09-02 DIAGNOSIS — M542 Cervicalgia: Secondary | ICD-10-CM | POA: Diagnosis not present

## 2016-09-02 DIAGNOSIS — Z6826 Body mass index (BMI) 26.0-26.9, adult: Secondary | ICD-10-CM | POA: Diagnosis not present

## 2016-09-02 DIAGNOSIS — M961 Postlaminectomy syndrome, not elsewhere classified: Secondary | ICD-10-CM | POA: Diagnosis not present

## 2016-09-13 ENCOUNTER — Other Ambulatory Visit
Admission: RE | Admit: 2016-09-13 | Discharge: 2016-09-13 | Disposition: A | Discharge status: Home / Self Care | Payer: BLUE CROSS/BLUE SHIELD | Source: Ambulatory Visit | Attending: Internal Medicine | Admitting: Internal Medicine

## 2016-09-13 DIAGNOSIS — K219 Gastro-esophageal reflux disease without esophagitis: Secondary | ICD-10-CM | POA: Diagnosis not present

## 2016-09-13 DIAGNOSIS — Q7649 Other congenital malformations of spine, not associated with scoliosis: Secondary | ICD-10-CM | POA: Diagnosis not present

## 2016-09-13 DIAGNOSIS — R7301 Impaired fasting glucose: Secondary | ICD-10-CM | POA: Diagnosis not present

## 2016-09-13 DIAGNOSIS — E782 Mixed hyperlipidemia: Secondary | ICD-10-CM | POA: Diagnosis not present

## 2016-09-13 DIAGNOSIS — R972 Elevated prostate specific antigen [PSA]: Secondary | ICD-10-CM | POA: Diagnosis not present

## 2016-09-13 LAB — CBC
HCT: 44.2 % (ref 40.0–52.0)
Hemoglobin: 15.6 g/dL (ref 13.0–18.0)
MCH: 32.3 pg (ref 26.0–34.0)
MCHC: 35.3 g/dL (ref 32.0–36.0)
MCV: 91.5 fL (ref 80.0–100.0)
Platelets: 272 10*3/uL (ref 150–440)
RBC: 4.82 MIL/uL (ref 4.40–5.90)
RDW: 12.9 % (ref 11.5–14.5)
WBC: 6 10*3/uL (ref 3.8–10.6)

## 2016-09-13 LAB — BASIC METABOLIC PANEL
Anion gap: 7 (ref 5–15)
BUN: 14 mg/dL (ref 6–20)
CO2: 26 mmol/L (ref 22–32)
Calcium: 9.2 mg/dL (ref 8.9–10.3)
Chloride: 106 mmol/L (ref 101–111)
Creatinine, Ser: 1.17 mg/dL (ref 0.61–1.24)
GFR calc Af Amer: >60 mL/min (ref >60)
GFR calc non Af Amer: >60 mL/min (ref >60)
Glucose, Bld: 118 mg/dL — ABNORMAL HIGH (ref 65–99)
Potassium: 4.1 mmol/L (ref 3.5–5.1)
Sodium: 139 mmol/L (ref 135–145)

## 2016-09-13 LAB — LIPID PANEL
Cholesterol: 164 mg/dL (ref 0–200)
HDL: 34 mg/dL — ABNORMAL LOW (ref >40)
LDL Cholesterol: 99 mg/dL (ref 0–99)
Total CHOL/HDL Ratio: 4.8 RATIO
Triglycerides: 156 mg/dL — ABNORMAL HIGH (ref <150)
VLDL: 31 mg/dL (ref 0–40)

## 2016-09-13 LAB — PSA: PSA: 0.39 ng/mL (ref 0.00–4.00)

## 2016-09-20 DIAGNOSIS — N529 Male erectile dysfunction, unspecified: Secondary | ICD-10-CM | POA: Diagnosis not present

## 2016-09-20 DIAGNOSIS — R011 Cardiac murmur, unspecified: Secondary | ICD-10-CM | POA: Diagnosis not present

## 2016-09-20 DIAGNOSIS — R079 Chest pain, unspecified: Secondary | ICD-10-CM | POA: Diagnosis not present

## 2016-09-20 DIAGNOSIS — E782 Mixed hyperlipidemia: Secondary | ICD-10-CM | POA: Diagnosis not present

## 2016-09-27 DIAGNOSIS — M5412 Radiculopathy, cervical region: Secondary | ICD-10-CM | POA: Diagnosis not present

## 2016-09-27 DIAGNOSIS — M542 Cervicalgia: Secondary | ICD-10-CM | POA: Diagnosis not present

## 2016-10-29 DIAGNOSIS — M542 Cervicalgia: Secondary | ICD-10-CM | POA: Diagnosis not present

## 2016-10-29 DIAGNOSIS — Z6827 Body mass index (BMI) 27.0-27.9, adult: Secondary | ICD-10-CM | POA: Diagnosis not present

## 2016-10-29 DIAGNOSIS — M5412 Radiculopathy, cervical region: Secondary | ICD-10-CM | POA: Diagnosis not present

## 2016-12-20 DIAGNOSIS — M25519 Pain in unspecified shoulder: Secondary | ICD-10-CM | POA: Diagnosis not present

## 2016-12-20 DIAGNOSIS — R011 Cardiac murmur, unspecified: Secondary | ICD-10-CM | POA: Diagnosis not present

## 2016-12-20 DIAGNOSIS — M2550 Pain in unspecified joint: Secondary | ICD-10-CM | POA: Diagnosis not present

## 2016-12-20 DIAGNOSIS — E782 Mixed hyperlipidemia: Secondary | ICD-10-CM | POA: Diagnosis not present

## 2016-12-20 DIAGNOSIS — N529 Male erectile dysfunction, unspecified: Secondary | ICD-10-CM | POA: Diagnosis not present

## 2016-12-20 DIAGNOSIS — M130 Polyarthritis, unspecified: Secondary | ICD-10-CM | POA: Diagnosis not present

## 2017-01-26 DIAGNOSIS — M542 Cervicalgia: Secondary | ICD-10-CM | POA: Diagnosis not present

## 2017-01-26 DIAGNOSIS — M961 Postlaminectomy syndrome, not elsewhere classified: Secondary | ICD-10-CM | POA: Diagnosis not present

## 2017-01-26 DIAGNOSIS — M5412 Radiculopathy, cervical region: Secondary | ICD-10-CM | POA: Diagnosis not present

## 2017-01-26 DIAGNOSIS — I10 Essential (primary) hypertension: Secondary | ICD-10-CM | POA: Diagnosis not present

## 2017-02-16 DIAGNOSIS — R011 Cardiac murmur, unspecified: Secondary | ICD-10-CM | POA: Diagnosis not present

## 2017-02-16 DIAGNOSIS — R079 Chest pain, unspecified: Secondary | ICD-10-CM | POA: Diagnosis not present

## 2017-02-16 DIAGNOSIS — E782 Mixed hyperlipidemia: Secondary | ICD-10-CM | POA: Diagnosis not present

## 2017-02-16 DIAGNOSIS — N529 Male erectile dysfunction, unspecified: Secondary | ICD-10-CM | POA: Diagnosis not present

## 2017-02-17 DIAGNOSIS — M5412 Radiculopathy, cervical region: Secondary | ICD-10-CM | POA: Diagnosis not present

## 2017-02-17 DIAGNOSIS — M961 Postlaminectomy syndrome, not elsewhere classified: Secondary | ICD-10-CM | POA: Diagnosis not present

## 2017-03-23 DIAGNOSIS — M961 Postlaminectomy syndrome, not elsewhere classified: Secondary | ICD-10-CM | POA: Diagnosis not present

## 2017-03-23 DIAGNOSIS — M5412 Radiculopathy, cervical region: Secondary | ICD-10-CM | POA: Diagnosis not present

## 2017-03-23 DIAGNOSIS — M542 Cervicalgia: Secondary | ICD-10-CM | POA: Diagnosis not present

## 2017-03-23 DIAGNOSIS — I10 Essential (primary) hypertension: Secondary | ICD-10-CM | POA: Diagnosis not present

## 2017-05-18 DIAGNOSIS — K219 Gastro-esophageal reflux disease without esophagitis: Secondary | ICD-10-CM | POA: Diagnosis not present

## 2017-05-18 DIAGNOSIS — N529 Male erectile dysfunction, unspecified: Secondary | ICD-10-CM | POA: Diagnosis not present

## 2017-05-18 DIAGNOSIS — R079 Chest pain, unspecified: Secondary | ICD-10-CM | POA: Diagnosis not present

## 2017-05-18 DIAGNOSIS — R7301 Impaired fasting glucose: Secondary | ICD-10-CM | POA: Diagnosis not present

## 2017-06-09 DIAGNOSIS — R079 Chest pain, unspecified: Secondary | ICD-10-CM | POA: Diagnosis not present

## 2017-06-09 DIAGNOSIS — R7301 Impaired fasting glucose: Secondary | ICD-10-CM | POA: Diagnosis not present

## 2017-06-09 DIAGNOSIS — K219 Gastro-esophageal reflux disease without esophagitis: Secondary | ICD-10-CM | POA: Diagnosis not present

## 2017-06-09 DIAGNOSIS — N529 Male erectile dysfunction, unspecified: Secondary | ICD-10-CM | POA: Diagnosis not present

## 2017-06-09 DIAGNOSIS — E782 Mixed hyperlipidemia: Secondary | ICD-10-CM | POA: Diagnosis not present

## 2017-06-14 DIAGNOSIS — R7301 Impaired fasting glucose: Secondary | ICD-10-CM | POA: Diagnosis not present

## 2017-06-14 DIAGNOSIS — K219 Gastro-esophageal reflux disease without esophagitis: Secondary | ICD-10-CM | POA: Diagnosis not present

## 2017-06-14 DIAGNOSIS — Q7649 Other congenital malformations of spine, not associated with scoliosis: Secondary | ICD-10-CM | POA: Diagnosis not present

## 2017-06-14 DIAGNOSIS — R079 Chest pain, unspecified: Secondary | ICD-10-CM | POA: Diagnosis not present

## 2017-06-14 DIAGNOSIS — N529 Male erectile dysfunction, unspecified: Secondary | ICD-10-CM | POA: Diagnosis not present

## 2017-07-06 DIAGNOSIS — M961 Postlaminectomy syndrome, not elsewhere classified: Secondary | ICD-10-CM | POA: Diagnosis not present

## 2017-07-06 DIAGNOSIS — M542 Cervicalgia: Secondary | ICD-10-CM | POA: Diagnosis not present

## 2017-07-06 DIAGNOSIS — M5412 Radiculopathy, cervical region: Secondary | ICD-10-CM | POA: Diagnosis not present

## 2017-07-06 DIAGNOSIS — Z6828 Body mass index (BMI) 28.0-28.9, adult: Secondary | ICD-10-CM | POA: Diagnosis not present

## 2017-07-12 DIAGNOSIS — M5412 Radiculopathy, cervical region: Secondary | ICD-10-CM | POA: Diagnosis not present

## 2017-07-12 DIAGNOSIS — M961 Postlaminectomy syndrome, not elsewhere classified: Secondary | ICD-10-CM | POA: Diagnosis not present

## 2017-09-05 DIAGNOSIS — M542 Cervicalgia: Secondary | ICD-10-CM | POA: Diagnosis not present

## 2017-09-05 DIAGNOSIS — M5412 Radiculopathy, cervical region: Secondary | ICD-10-CM | POA: Diagnosis not present

## 2017-09-05 DIAGNOSIS — I10 Essential (primary) hypertension: Secondary | ICD-10-CM | POA: Diagnosis not present

## 2017-09-05 DIAGNOSIS — M961 Postlaminectomy syndrome, not elsewhere classified: Secondary | ICD-10-CM | POA: Diagnosis not present

## 2017-09-13 ENCOUNTER — Encounter: Payer: Self-pay | Admitting: Emergency Medicine

## 2017-09-13 ENCOUNTER — Emergency Department
Admission: EM | Admit: 2017-09-13 | Discharge: 2017-09-13 | Disposition: A | Discharge status: Home / Self Care | Payer: BLUE CROSS/BLUE SHIELD | Attending: Emergency Medicine | Admitting: Emergency Medicine

## 2017-09-13 ENCOUNTER — Other Ambulatory Visit: Payer: Self-pay

## 2017-09-13 ENCOUNTER — Emergency Department: Payer: BLUE CROSS/BLUE SHIELD

## 2017-09-13 DIAGNOSIS — I4510 Unspecified right bundle-branch block: Secondary | ICD-10-CM | POA: Diagnosis not present

## 2017-09-13 DIAGNOSIS — I493 Ventricular premature depolarization: Secondary | ICD-10-CM | POA: Insufficient documentation

## 2017-09-13 DIAGNOSIS — I10 Essential (primary) hypertension: Secondary | ICD-10-CM | POA: Insufficient documentation

## 2017-09-13 DIAGNOSIS — R002 Palpitations: Secondary | ICD-10-CM | POA: Diagnosis not present

## 2017-09-13 DIAGNOSIS — Z87891 Personal history of nicotine dependence: Secondary | ICD-10-CM | POA: Diagnosis not present

## 2017-09-13 DIAGNOSIS — R0689 Other abnormalities of breathing: Secondary | ICD-10-CM | POA: Diagnosis not present

## 2017-09-13 DIAGNOSIS — I498 Other specified cardiac arrhythmias: Secondary | ICD-10-CM | POA: Diagnosis not present

## 2017-09-13 DIAGNOSIS — R0602 Shortness of breath: Secondary | ICD-10-CM | POA: Diagnosis not present

## 2017-09-13 DIAGNOSIS — Z79899 Other long term (current) drug therapy: Secondary | ICD-10-CM | POA: Diagnosis not present

## 2017-09-13 DIAGNOSIS — R9431 Abnormal electrocardiogram [ECG] [EKG]: Secondary | ICD-10-CM | POA: Diagnosis not present

## 2017-09-13 LAB — BASIC METABOLIC PANEL
Anion gap: 7 (ref 5–15)
BUN: 15 mg/dL (ref 6–20)
CHLORIDE: 105 mmol/L (ref 101–111)
CO2: 27 mmol/L (ref 22–32)
CREATININE: 1.16 mg/dL (ref 0.61–1.24)
Calcium: 9.6 mg/dL (ref 8.9–10.3)
GFR calc Af Amer: >60 mL/min (ref >60)
GFR calc non Af Amer: >60 mL/min (ref >60)
GLUCOSE: 113 mg/dL — AB (ref 65–99)
Potassium: 4.1 mmol/L (ref 3.5–5.1)
Sodium: 139 mmol/L (ref 135–145)

## 2017-09-13 LAB — CBC
HCT: 43.4 % (ref 40.0–52.0)
HEMOGLOBIN: 15.2 g/dL (ref 13.0–18.0)
MCH: 32.4 pg (ref 26.0–34.0)
MCHC: 35 g/dL (ref 32.0–36.0)
MCV: 92.5 fL (ref 80.0–100.0)
Platelets: 255 10*3/uL (ref 150–440)
RBC: 4.69 MIL/uL (ref 4.40–5.90)
RDW: 13.2 % (ref 11.5–14.5)
WBC: 6.4 10*3/uL (ref 3.8–10.6)

## 2017-09-13 LAB — TROPONIN I: Troponin I: <0.03 ng/mL (ref <0.03)

## 2017-09-13 MED ORDER — IPRATROPIUM-ALBUTEROL 0.5-2.5 (3) MG/3ML IN SOLN
3.0000 mL | Freq: Once | RESPIRATORY_TRACT | Status: AC
  Administered 2017-09-13: 3 mL via RESPIRATORY_TRACT
  Filled 2017-09-13: qty 3

## 2017-09-13 NOTE — ED Triage Notes (Signed)
Pt c/o SHOB and palpitations X 3 days.  Denies any pain at this time.  Ambulatory without noted dyspnea. No fevers or cough.  Unlabored. VSS.

## 2017-09-13 NOTE — ED Provider Notes (Signed)
[facility] Emergency Department Provider Note _______________________________________   I have reviewed the triage vital signs and the nursing notes.   HISTORY  Chief Complaint Shortness of Breath   History limited by: Not Limited   HPI Eric QuakerRobert Barrington Jr. is a 51 y.o. male who presents to the emergency department today because of primary care physician's concern for abnormal EKG findings, patient's primary complaint is palpitations and shortness of breath.  DURATION:3 days TIMING: waxing and waning SEVERITY: severe QUALITY: feels like heart is skipping a beat CONTEXT: patient states he went to his doctors office today because of concern for palpiations and shortness of breath for three days. The patient states that he had an EKG done which concerned his PCP. He is not exactly clear what about the EKG was concerning but does recall that it mentioned a RBBB. MODIFYING FACTORS: symptoms worse with exertion ASSOCIATED SYMPTOMS: denies any chest pain. No fevers. No swelling.   Per medical record review patient has a history of RBBB noted on previous EKGs.   Past Medical History:  Diagnosis Date  . Arthritis   . Blood transfusion   . GERD (gastroesophageal reflux disease)   . Hypertension   . Peptic ulcer     There are no active problems to display for this patient.   Past Surgical History:  Procedure Laterality Date  . BUNIONECTOMY    . CARPAL TUNNEL RELEASE     right  . CERVICAL FUSION    . COLONOSCOPY N/A 03/04/2015   Procedure: COLONOSCOPY;  Surgeon: Kieth BrightlySeeplaputhur G Sankar, MD;  Location: [facility] ENDOSCOPY;  Service: Endoscopy;  Laterality: N/A;  . HAMMER TOE SURGERY    . POSTERIOR CERVICAL FUSION/FORAMINOTOMY  03/16/2012   Procedure: POSTERIOR CERVICAL FUSION/FORAMINOTOMY LEVEL 1;  Surgeon: Tia Alertavid S Jones, MD;  Location: [facility];  Service: Neurosurgery;  Laterality: N/A;  Cervical six-seven posterior cervical fusion with lateral mass screws     Prior to Admission medications   Medication Sig Start Date End Date Taking? Authorizing Provider  docusate sodium (COLACE) 100 MG capsule Take 100 mg by mouth as needed for mild constipation.     [provider]  HYDROcodone-acetaminophen (NORCO) 5-325 MG per tablet Take 1 tablet by mouth every 6 (six) hours as needed. For pain    [provider]  hydrocortisone cream 1 % Apply 1 application topically 2 (two) times daily. 01/22/15   Sankar, Janet BerlinSeeplaputhur G, MD  lisinopril (PRINIVIL,ZESTRIL) 20 MG tablet Take 20 mg by mouth every morning.  01/15/15   [provider]  loratadine (CLARITIN) 10 MG tablet Take 10 mg by mouth daily as needed. 12/19/14   [provider]  RA VITAMIN D-3 1000 UNITS tablet Take 1,000 Units by mouth daily.  01/19/15   [provider]  ranitidine (ZANTAC) 150 MG tablet Take 150 mg by mouth every morning.     [provider]  VIAGRA 50 MG tablet Take 1 tablet by mouth as needed. 01/09/15   [provider]    Allergies Aspirin  History reviewed. No pertinent family history.  Social History Social History   Tobacco Use  . Smoking status: Former Smoker    Packs/day: 0.40    Years: 18.00    Pack years: 7.20    Types: Cigarettes    Last attempt to quit: 01/27/2006    Years since quitting: 11.6  . Smokeless tobacco: Never Used  Substance Use Topics  . Alcohol use: Yes    Comment: occasionally  .  Drug use: No    Review of Systems Constitutional: No fever/chills Eyes: No visual changes. ENT: No sore throat. Cardiovascular: Positive for palpitations Respiratory: Positive for shortness of breath. Gastrointestinal: No abdominal pain.  No nausea, no vomiting.  No diarrhea.   Genitourinary: Negative for dysuria. Musculoskeletal: Negative for back pain. Skin: Negative for rash. Neurological: Negative for headaches, focal weakness or numbness.  ____________________________________________   PHYSICAL  EXAM:  VITAL SIGNS: ED Triage Vitals  Enc Vitals Group     BP 09/13/17 1527 140/72     Pulse Rate 09/13/17 1527 62     Resp 09/13/17 1527 16     Temp 09/13/17 1527 98.7 F (37.1 C)     Temp Source 09/13/17 1527 Oral     SpO2 09/13/17 1527 97 %     Weight 09/13/17 1525 206 lb (93.4 kg)     Height 09/13/17 1525 5\' 11"  (1.803 m)    Constitutional: Alert and oriented. Well appearing and in no distress. Eyes: Conjunctivae are normal.  ENT   Head: Normocephalic and atraumatic.   Nose: No congestion/rhinnorhea.   Mouth/Throat: Mucous membranes are moist.   Neck: No stridor. Hematological/Lymphatic/Immunilogical: No cervical lymphadenopathy. Cardiovascular: Normal rate, regular rhythm.  No murmurs, rubs, or gallops.  Respiratory: Normal respiratory effort without tachypnea nor retractions. Breath sounds are clear and equal bilaterally. No wheezes/rales/rhonchi. Gastrointestinal: Soft and non tender. No rebound. No guarding.  Genitourinary: Deferred Musculoskeletal: Normal range of motion in all extremities. No lower extremity edema. Neurologic:  Normal speech and language. No gross focal neurologic deficits are appreciated.  Skin:  Skin is warm, dry and intact. No rash noted. Psychiatric: Mood and affect are normal. Speech and behavior are normal. Patient exhibits appropriate insight and judgment.  ____________________________________________    LABS (pertinent positives/negatives)  Trop <0.03 CBC wnl BMP wnl except glu 113  ____________________________________________   EKG  I, Phineas SemenGraydon Kaelani Kendrick, attending physician, personally viewed and interpreted this EKG  EKG Time: 1523 Rate: 64 Rhythm: normal sinus rhythm Axis: normal Intervals: qtc 408 QRS: RBBB ST changes: st elevation V5 Impression: abnormal ekg  No significant change when compared to 10/23/2015 ____________________________________________    RADIOLOGY  CXR No acute  disease  ____________________________________________   PROCEDURES  Procedures  ____________________________________________   INITIAL IMPRESSION / ASSESSMENT AND PLAN / ED COURSE  Pertinent labs & imaging results that were available during my care of the patient were reviewed by me and considered in my medical decision making (see chart for details).  Patient presents with concern for palpitations and sob. Ddx includes pvcs, afib/aflutter, pneumonia, pneumothorax, PE, electrolyte abnormality, anemia amongst other etiologies. Work up here without concerning findings. He was found to have PVCs on monitor which correspond to his symptoms. In terms of the EKG being abnormal, no significant change when compared to EKG performed over 1 year ago. Discussed findings and results with patient. Discussed importance of follow up.  ____________________________________________   FINAL CLINICAL IMPRESSION(S) / ED DIAGNOSES  Final diagnoses:  Palpitation  PVC (premature ventricular contraction)  Right bundle branch block     Note: This dictation was prepared with Dragon dictation. Any transcriptional errors that result from this process are unintentional     Phineas SemenGoodman, Bennett Ram, MD 09/13/17 [phone]

## 2017-09-13 NOTE — ED Notes (Signed)
Pt ambulatory to wheel chair upon discharge. Pt and significant other verbalized understanding of discharge instructions and follow-up care. VSS. A&O x4. Skin warm and dry. 

## 2017-09-13 NOTE — ED Notes (Signed)
ED Provider at bedside. 

## 2017-09-13 NOTE — Discharge Instructions (Signed)
Please seek medical attention for any high fevers, chest pain, shortness of breath, change in behavior, persistent vomiting, bloody stool or any other new or concerning symptoms.  

## 2017-11-07 DIAGNOSIS — M961 Postlaminectomy syndrome, not elsewhere classified: Secondary | ICD-10-CM | POA: Diagnosis not present

## 2017-11-30 DIAGNOSIS — M961 Postlaminectomy syndrome, not elsewhere classified: Secondary | ICD-10-CM | POA: Diagnosis not present

## 2017-11-30 DIAGNOSIS — M5412 Radiculopathy, cervical region: Secondary | ICD-10-CM | POA: Diagnosis not present

## 2017-11-30 DIAGNOSIS — M25512 Pain in left shoulder: Secondary | ICD-10-CM | POA: Diagnosis not present

## 2017-11-30 DIAGNOSIS — M542 Cervicalgia: Secondary | ICD-10-CM | POA: Diagnosis not present

## 2017-12-05 DIAGNOSIS — S46012D Strain of muscle(s) and tendon(s) of the rotator cuff of left shoulder, subsequent encounter: Secondary | ICD-10-CM | POA: Diagnosis not present

## 2017-12-05 DIAGNOSIS — M25512 Pain in left shoulder: Secondary | ICD-10-CM | POA: Diagnosis not present

## 2017-12-05 DIAGNOSIS — M7542 Impingement syndrome of left shoulder: Secondary | ICD-10-CM | POA: Diagnosis not present

## 2017-12-08 DIAGNOSIS — M7542 Impingement syndrome of left shoulder: Secondary | ICD-10-CM | POA: Diagnosis not present

## 2017-12-08 DIAGNOSIS — S46012D Strain of muscle(s) and tendon(s) of the rotator cuff of left shoulder, subsequent encounter: Secondary | ICD-10-CM | POA: Diagnosis not present

## 2017-12-08 DIAGNOSIS — M25512 Pain in left shoulder: Secondary | ICD-10-CM | POA: Diagnosis not present

## 2017-12-13 DIAGNOSIS — M7542 Impingement syndrome of left shoulder: Secondary | ICD-10-CM | POA: Diagnosis not present

## 2017-12-13 DIAGNOSIS — M25512 Pain in left shoulder: Secondary | ICD-10-CM | POA: Diagnosis not present

## 2017-12-13 DIAGNOSIS — S46012D Strain of muscle(s) and tendon(s) of the rotator cuff of left shoulder, subsequent encounter: Secondary | ICD-10-CM | POA: Diagnosis not present

## 2018-02-06 DIAGNOSIS — M961 Postlaminectomy syndrome, not elsewhere classified: Secondary | ICD-10-CM | POA: Diagnosis not present

## 2018-03-08 DIAGNOSIS — R079 Chest pain, unspecified: Secondary | ICD-10-CM | POA: Diagnosis not present

## 2018-03-08 DIAGNOSIS — R011 Cardiac murmur, unspecified: Secondary | ICD-10-CM | POA: Diagnosis not present

## 2018-03-08 DIAGNOSIS — M5412 Radiculopathy, cervical region: Secondary | ICD-10-CM | POA: Diagnosis not present

## 2018-03-08 DIAGNOSIS — Q7649 Other congenital malformations of spine, not associated with scoliosis: Secondary | ICD-10-CM | POA: Diagnosis not present

## 2018-03-08 DIAGNOSIS — K219 Gastro-esophageal reflux disease without esophagitis: Secondary | ICD-10-CM | POA: Diagnosis not present

## 2018-03-08 DIAGNOSIS — M961 Postlaminectomy syndrome, not elsewhere classified: Secondary | ICD-10-CM | POA: Diagnosis not present

## 2018-03-15 DIAGNOSIS — Z0000 Encounter for general adult medical examination without abnormal findings: Secondary | ICD-10-CM | POA: Diagnosis not present

## 2018-05-09 DIAGNOSIS — M5412 Radiculopathy, cervical region: Secondary | ICD-10-CM | POA: Diagnosis not present

## 2018-06-08 DIAGNOSIS — I10 Essential (primary) hypertension: Secondary | ICD-10-CM | POA: Diagnosis not present

## 2018-06-08 DIAGNOSIS — M5412 Radiculopathy, cervical region: Secondary | ICD-10-CM | POA: Diagnosis not present

## 2018-06-08 DIAGNOSIS — M961 Postlaminectomy syndrome, not elsewhere classified: Secondary | ICD-10-CM | POA: Diagnosis not present

## 2018-06-08 DIAGNOSIS — Z6828 Body mass index (BMI) 28.0-28.9, adult: Secondary | ICD-10-CM | POA: Diagnosis not present

## 2018-09-04 DIAGNOSIS — Q7649 Other congenital malformations of spine, not associated with scoliosis: Secondary | ICD-10-CM | POA: Diagnosis not present

## 2018-09-04 DIAGNOSIS — S32009A Unspecified fracture of unspecified lumbar vertebra, initial encounter for closed fracture: Secondary | ICD-10-CM | POA: Diagnosis not present

## 2018-09-04 DIAGNOSIS — K219 Gastro-esophageal reflux disease without esophagitis: Secondary | ICD-10-CM | POA: Diagnosis not present

## 2018-09-04 DIAGNOSIS — N529 Male erectile dysfunction, unspecified: Secondary | ICD-10-CM | POA: Diagnosis not present

## 2018-09-05 DIAGNOSIS — M5412 Radiculopathy, cervical region: Secondary | ICD-10-CM | POA: Diagnosis not present

## 2018-11-23 DIAGNOSIS — Z6828 Body mass index (BMI) 28.0-28.9, adult: Secondary | ICD-10-CM | POA: Diagnosis not present

## 2018-11-23 DIAGNOSIS — I10 Essential (primary) hypertension: Secondary | ICD-10-CM | POA: Diagnosis not present

## 2018-11-23 DIAGNOSIS — M961 Postlaminectomy syndrome, not elsewhere classified: Secondary | ICD-10-CM | POA: Diagnosis not present

## 2018-11-23 DIAGNOSIS — M5412 Radiculopathy, cervical region: Secondary | ICD-10-CM | POA: Diagnosis not present

## 2018-11-30 ENCOUNTER — Other Ambulatory Visit: Payer: Self-pay | Admitting: Neurosurgery

## 2018-11-30 DIAGNOSIS — T1590XA Foreign body on external eye, part unspecified, unspecified eye, initial encounter: Secondary | ICD-10-CM

## 2018-11-30 DIAGNOSIS — M961 Postlaminectomy syndrome, not elsewhere classified: Secondary | ICD-10-CM

## 2018-12-11 ENCOUNTER — Ambulatory Visit
Admission: RE | Admit: 2018-12-11 | Discharge: 2018-12-11 | Disposition: A | Discharge status: Home / Self Care | Payer: BLUE CROSS/BLUE SHIELD | Source: Ambulatory Visit | Attending: Neurosurgery | Admitting: Neurosurgery

## 2018-12-11 DIAGNOSIS — Z77018 Contact with and (suspected) exposure to other hazardous metals: Secondary | ICD-10-CM | POA: Diagnosis not present

## 2018-12-11 DIAGNOSIS — T1590XA Foreign body on external eye, part unspecified, unspecified eye, initial encounter: Secondary | ICD-10-CM

## 2018-12-11 DIAGNOSIS — M961 Postlaminectomy syndrome, not elsewhere classified: Secondary | ICD-10-CM

## 2018-12-11 DIAGNOSIS — M4802 Spinal stenosis, cervical region: Secondary | ICD-10-CM | POA: Diagnosis not present

## 2019-01-04 DIAGNOSIS — M5412 Radiculopathy, cervical region: Secondary | ICD-10-CM | POA: Diagnosis not present

## 2019-01-09 DIAGNOSIS — M542 Cervicalgia: Secondary | ICD-10-CM | POA: Diagnosis not present

## 2019-01-18 DIAGNOSIS — M542 Cervicalgia: Secondary | ICD-10-CM | POA: Diagnosis not present

## 2019-02-20 DIAGNOSIS — M5412 Radiculopathy, cervical region: Secondary | ICD-10-CM | POA: Diagnosis not present

## 2019-02-20 DIAGNOSIS — M961 Postlaminectomy syndrome, not elsewhere classified: Secondary | ICD-10-CM | POA: Diagnosis not present

## 2019-03-22 DIAGNOSIS — M5412 Radiculopathy, cervical region: Secondary | ICD-10-CM | POA: Diagnosis not present

## 2019-04-24 DIAGNOSIS — Z6828 Body mass index (BMI) 28.0-28.9, adult: Secondary | ICD-10-CM | POA: Diagnosis not present

## 2019-04-24 DIAGNOSIS — R030 Elevated blood-pressure reading, without diagnosis of hypertension: Secondary | ICD-10-CM | POA: Diagnosis not present

## 2019-04-24 DIAGNOSIS — M542 Cervicalgia: Secondary | ICD-10-CM | POA: Diagnosis not present

## 2019-04-24 DIAGNOSIS — M961 Postlaminectomy syndrome, not elsewhere classified: Secondary | ICD-10-CM | POA: Diagnosis not present

## 2019-05-16 DIAGNOSIS — Z0000 Encounter for general adult medical examination without abnormal findings: Secondary | ICD-10-CM | POA: Diagnosis not present

## 2019-05-17 DIAGNOSIS — Z125 Encounter for screening for malignant neoplasm of prostate: Secondary | ICD-10-CM | POA: Diagnosis not present

## 2019-05-17 DIAGNOSIS — R5381 Other malaise: Secondary | ICD-10-CM | POA: Diagnosis not present

## 2019-05-17 DIAGNOSIS — E7849 Other hyperlipidemia: Secondary | ICD-10-CM | POA: Diagnosis not present

## 2019-05-17 DIAGNOSIS — I10 Essential (primary) hypertension: Secondary | ICD-10-CM | POA: Diagnosis not present

## 2019-05-18 DIAGNOSIS — R740 Nonspecific elevation of levels of transaminase and lactic acid dehydrogenase [LDH]: Secondary | ICD-10-CM | POA: Diagnosis not present

## 2019-05-18 DIAGNOSIS — E782 Mixed hyperlipidemia: Secondary | ICD-10-CM | POA: Diagnosis not present

## 2019-05-18 DIAGNOSIS — R7309 Other abnormal glucose: Secondary | ICD-10-CM | POA: Diagnosis not present

## 2019-05-25 DIAGNOSIS — R011 Cardiac murmur, unspecified: Secondary | ICD-10-CM | POA: Diagnosis not present

## 2019-05-25 DIAGNOSIS — Q7649 Other congenital malformations of spine, not associated with scoliosis: Secondary | ICD-10-CM | POA: Diagnosis not present

## 2019-05-25 DIAGNOSIS — S32009A Unspecified fracture of unspecified lumbar vertebra, initial encounter for closed fracture: Secondary | ICD-10-CM | POA: Diagnosis not present

## 2019-05-25 DIAGNOSIS — K219 Gastro-esophageal reflux disease without esophagitis: Secondary | ICD-10-CM | POA: Diagnosis not present

## 2019-06-05 DIAGNOSIS — E782 Mixed hyperlipidemia: Secondary | ICD-10-CM | POA: Diagnosis not present

## 2019-06-05 DIAGNOSIS — S32009A Unspecified fracture of unspecified lumbar vertebra, initial encounter for closed fracture: Secondary | ICD-10-CM | POA: Diagnosis not present

## 2019-06-05 DIAGNOSIS — R011 Cardiac murmur, unspecified: Secondary | ICD-10-CM | POA: Diagnosis not present

## 2019-06-05 DIAGNOSIS — Q7649 Other congenital malformations of spine, not associated with scoliosis: Secondary | ICD-10-CM | POA: Diagnosis not present

## 2019-06-05 DIAGNOSIS — K219 Gastro-esophageal reflux disease without esophagitis: Secondary | ICD-10-CM | POA: Diagnosis not present

## 2019-06-26 DIAGNOSIS — M5413 Radiculopathy, cervicothoracic region: Secondary | ICD-10-CM | POA: Diagnosis not present

## 2019-07-27 DIAGNOSIS — I10 Essential (primary) hypertension: Secondary | ICD-10-CM | POA: Diagnosis not present

## 2019-07-27 DIAGNOSIS — M961 Postlaminectomy syndrome, not elsewhere classified: Secondary | ICD-10-CM | POA: Diagnosis not present

## 2019-07-27 DIAGNOSIS — Z6827 Body mass index (BMI) 27.0-27.9, adult: Secondary | ICD-10-CM | POA: Diagnosis not present

## 2019-09-21 ENCOUNTER — Other Ambulatory Visit: Payer: Self-pay

## 2019-09-21 DIAGNOSIS — Z20822 Contact with and (suspected) exposure to covid-19: Secondary | ICD-10-CM

## 2019-09-23 LAB — NOVEL CORONAVIRUS, NAA: SARS-CoV-2, NAA: NOT DETECTED

## 2020-05-07 ENCOUNTER — Encounter: Payer: Self-pay | Admitting: Internal Medicine

## 2020-05-07 ENCOUNTER — Ambulatory Visit: Payer: BLUE CROSS/BLUE SHIELD | Admitting: Internal Medicine

## 2020-05-07 ENCOUNTER — Other Ambulatory Visit: Payer: Self-pay

## 2020-05-07 VITALS — BP 134/79 | HR 59 | Ht 71.0 in | Wt 199.3 lb

## 2020-05-07 DIAGNOSIS — M1990 Unspecified osteoarthritis, unspecified site: Secondary | ICD-10-CM | POA: Diagnosis not present

## 2020-05-07 DIAGNOSIS — Z0000 Encounter for general adult medical examination without abnormal findings: Secondary | ICD-10-CM | POA: Diagnosis not present

## 2020-05-07 DIAGNOSIS — I10 Essential (primary) hypertension: Secondary | ICD-10-CM | POA: Insufficient documentation

## 2020-05-07 DIAGNOSIS — Z716 Tobacco abuse counseling: Secondary | ICD-10-CM

## 2020-05-07 DIAGNOSIS — K219 Gastro-esophageal reflux disease without esophagitis: Secondary | ICD-10-CM

## 2020-05-07 NOTE — Assessment & Plan Note (Signed)
Arthritis of the neck is under control he does not have any pain in the lower back.

## 2020-05-07 NOTE — Assessment & Plan Note (Signed)
Blood pressure is under control at the present time medicine for that.

## 2020-05-07 NOTE — Assessment & Plan Note (Signed)
Tobacco use discussed with the patient.  He was told that it can make his arthritis worse and may relate to COPD and coronary artery disease.  And he seemed to understand the risk.

## 2020-05-07 NOTE — Assessment & Plan Note (Signed)
Reflux is under control. 

## 2020-05-07 NOTE — Assessment & Plan Note (Signed)
Patient blood pressure is under good control he was advised to quit smoking completely.  Taking antacid or Prilosec for the reflux now.  He has neck surgery and take vitamin D daily for that.  He denies any history of pain going down the right arm or left arm.  Denies any lower back pain.

## 2020-05-07 NOTE — Progress Notes (Signed)
Established Patient Office Visit  Subjective:  Patient ID: Eric Owen., male    DOB: 09/19/1966  Age: 54 y.o. MRN: [id number]  CC:  Chief Complaint  Patient presents with  . Follow-up    General 6 month follow up, no complaints          [name] presents for general checkup is a history of chest pain nausea vomiting or shortness of breath.  He is wearing a mask and has not been exposed to Covid.  He does not smoke does not drink.  He has a reflux problem hypertension and has a history of ulcers in the past.  But has a history of neck surgery in the past.  Denies any abdominal pain.  Blood pressure is under control 134/79 on the present medication.will complete lipid test metabolic panel PSA and TSH in the blood will be drawn today as he is fasting.  Past Medical History:  Diagnosis Date  . Arthritis   . Blood transfusion   . GERD (gastroesophageal reflux disease)   . Hypertension   . Peptic ulcer     Past Surgical History:  Procedure Laterality Date  . BUNIONECTOMY    . CARPAL TUNNEL RELEASE     right  . CERVICAL FUSION    . COLONOSCOPY N/A 03/04/2015   Procedure: COLONOSCOPY;  Surgeon: Kieth Brightly, MD;  Location: [facility];  Service: Endoscopy;  Laterality: N/A;  . HAMMER TOE SURGERY    . POSTERIOR CERVICAL FUSION/FORAMINOTOMY  03/16/2012   Procedure: POSTERIOR CERVICAL FUSION/FORAMINOTOMY LEVEL 1;  Surgeon: Tia Alert, MD;  Location: [facility];  Service: Neurosurgery;  Laterality: N/A;  Cervical six-seven posterior cervical fusion with lateral mass screws    History reviewed. No pertinent family history.  Social History   Socioeconomic History  . Marital status: Married    Spouse name: Not on file  . Number of children: Not on file  . Years of education: Not on file  . Highest education level: Not on file  Occupational History  . Not on file  Tobacco Use  . Smoking status: Former Smoker    Packs/day: 0.40    Years: 18.00     Pack years: 7.20    Types: Cigarettes    Quit date: 01/27/2006    Years since quitting: 14.2  . Smokeless tobacco: Never Used  Substance and Sexual Activity  . Alcohol use: Yes    Comment: occasionally  . Drug use: No  . Sexual activity: Not on file  Other Topics Concern  . Not on file  Social History Narrative  . Not on file   Social Determinants of Health   Financial Resource Strain:   . Difficulty of Paying Living Expenses:   Food Insecurity:   . Worried About Programme researcher, broadcasting/film/video in the Last Year:   . Barista in the Last Year:   Transportation Needs:   . Freight forwarder (Medical):   Marland Kitchen Lack of Transportation (Non-Medical):   Physical Activity:   . Days of Exercise per Week:   . Minutes of Exercise per Session:   Stress:   . Feeling of Stress :   Social Connections:   . Frequency of Communication with Friends and Family:   . Frequency of Social Gatherings with Friends and Family:   . Attends Religious Services:   . Active Member of Clubs or Organizations:   . Attends Banker Meetings:   Marland Kitchen Marital Status:  Intimate Partner Violence:   . Fear of Current or Ex-Partner:   . Emotionally Abused:   Marland Kitchen Physically Abused:   . Sexually Abused:      Current Outpatient Medications:  .  HYDROcodone-acetaminophen (NORCO) 5-325 MG per tablet, Take 1 tablet by mouth every 6 (six) hours as needed. For pain, Disp: , Rfl:  .  RA VITAMIN D-3 1000 UNITS tablet, Take 1,000 Units by mouth daily. , Disp: , Rfl: 0 .  ranitidine (ZANTAC) 150 MG tablet, Take 150 mg by mouth every morning. , Disp: , Rfl:    Allergies  Allergen Reactions  . Aspirin Nausea Only    ROS Review of Systems  Constitutional: Negative.   HENT: Negative.  Negative for nosebleeds.   Eyes: Negative.  Negative for pain.  Respiratory: Negative.  Negative for choking and shortness of breath.   Cardiovascular: Negative.  Negative for chest pain, palpitations and leg swelling.    Gastrointestinal: Negative.  Negative for blood in stool.  Endocrine: Negative.  Negative for polydipsia.  Genitourinary: Negative.  Negative for hematuria.  Musculoskeletal: Negative.  Negative for joint swelling.  Allergic/Immunologic: Negative for food allergies.  Neurological: Negative.  Negative for speech difficulty.  Hematological: Negative.   Psychiatric/Behavioral: Negative.  Negative for dysphoric mood. The patient is not nervous/anxious and is not hyperactive.       Objective:    Physical Exam Constitutional:      Appearance: Normal appearance.  HENT:     Head: Normocephalic.     Right Ear: Tympanic membrane normal.     Left Ear: Tympanic membrane normal.     Nose: Nose normal.     Mouth/Throat:     Mouth: Mucous membranes are moist.  Eyes:     Conjunctiva/sclera: Conjunctivae normal.  Cardiovascular:     Rate and Rhythm: Normal rate.     Pulses: Normal pulses.     Heart sounds: Normal heart sounds. No murmur heard.  No friction rub. No gallop.   Pulmonary:     Effort: No respiratory distress.     Breath sounds: No wheezing, rhonchi or rales.  Chest:     Chest wall: No tenderness.  Abdominal:     General: There is no distension.     Tenderness: There is no abdominal tenderness. There is no guarding.     Hernia: No hernia is present.  Musculoskeletal:        General: No tenderness.     Cervical back: Normal range of motion and neck supple.     Left lower leg: No edema.  Skin:    General: Skin is warm.     Findings: No erythema or rash.  Neurological:     General: No focal deficit present.     Mental Status: He is alert and oriented to person, place, and time.     Sensory: No sensory deficit.     BP 134/79   Pulse (!) 59   Ht 5\' 11"  (1.803 m)   Wt 199 lb 4.8 oz (90.4 kg)   BMI 27.80 kg/m  Wt Readings from Last 3 Encounters:  05/07/20 199 lb 4.8 oz (90.4 kg)  09/13/17 206 lb (93.4 kg)  10/02/15 202 lb (91.6 kg)     Health Maintenance Due   Topic Date Due  . Hepatitis C Screening  Never done  . COVID-19 Vaccine (1) Never done  . HIV Screening  Never done  . TETANUS/TDAP  Never done    There are no preventive care  reminders to display for this patient.  No results found for: TSH Lab Results  Component Value Date   WBC 6.4 09/13/2017   HGB 15.2 09/13/2017   HCT 43.4 09/13/2017   MCV 92.5 09/13/2017   PLT 255 09/13/2017   Lab Results  Component Value Date   NA 139 09/13/2017   K 4.1 09/13/2017   CO2 27 09/13/2017   GLUCOSE 113 (H) 09/13/2017   BUN 15 09/13/2017   CREATININE 1.16 09/13/2017   CALCIUM 9.6 09/13/2017   ANIONGAP 7 09/13/2017   Lab Results  Component Value Date   CHOL 164 09/13/2016   Lab Results  Component Value Date   HDL 34 (L) 09/13/2016   Lab Results  Component Value Date   LDLCALC 99 09/13/2016   Lab Results  Component Value Date   TRIG 156 (H) 09/13/2016   Lab Results  Component Value Date   CHOLHDL 4.8 09/13/2016   No results found for: HGBA1C    Assessment & Plan:   Problem List Items Addressed This Visit      Cardiovascular and Mediastinum   Hypertension    Blood pressure is under control at the present time medicine for that.        Digestive   GERD (gastroesophageal reflux disease)    Reflux is under control        Musculoskeletal and Integument   Arthritis    Arthritis of the neck is under control he does not have any pain in the lower back.        Other   Annual physical exam - Primary    Patient blood pressure is under good control he was advised to quit smoking completely.  Taking antacid or Prilosec for the reflux now.  He has neck surgery and take vitamin D daily for that.  He denies any history of pain going down the right arm or left arm.  Denies any lower back pain.      Relevant Orders   CBC with Differential/Platelet   COMPLETE METABOLIC PANEL WITH GFR   Lipid panel   TSH   PSA   Tobacco abuse counseling    Tobacco use discussed with  the patient.  He was told that it can make his arthritis worse and may relate to COPD and coronary artery disease.  And he seemed to understand the risk.         No orders of the defined types were placed in this encounter.   Follow-up: No follow-ups on file.    Corky Downs, MD

## 2020-05-13 ENCOUNTER — Other Ambulatory Visit: Payer: Self-pay

## 2020-05-13 ENCOUNTER — Ambulatory Visit (INDEPENDENT_AMBULATORY_CARE_PROVIDER_SITE_OTHER): Payer: 59 | Admitting: Internal Medicine

## 2020-05-13 ENCOUNTER — Encounter: Payer: Self-pay | Admitting: Internal Medicine

## 2020-05-13 VITALS — BP 119/74 | HR 94 | Ht 71.0 in | Wt 199.0 lb

## 2020-05-13 DIAGNOSIS — K219 Gastro-esophageal reflux disease without esophagitis: Secondary | ICD-10-CM | POA: Diagnosis not present

## 2020-05-13 DIAGNOSIS — E139 Other specified diabetes mellitus without complications: Secondary | ICD-10-CM | POA: Diagnosis not present

## 2020-05-13 DIAGNOSIS — E782 Mixed hyperlipidemia: Secondary | ICD-10-CM | POA: Diagnosis not present

## 2020-05-13 DIAGNOSIS — M1990 Unspecified osteoarthritis, unspecified site: Secondary | ICD-10-CM

## 2020-05-13 DIAGNOSIS — I10 Essential (primary) hypertension: Secondary | ICD-10-CM

## 2020-05-13 MED ORDER — METFORMIN HCL 500 MG PO TABS: 500.0000 mg | ORAL_TABLET | Freq: Two times a day (BID) | ORAL | 3 refills | Status: DC

## 2020-05-13 NOTE — Assessment & Plan Note (Signed)
-   The patient's GERD is not stable on medication.  - Instructed the patient to avoid eating spicy and acidic foods, as well as foods high in fat. - Instructed the patient to avoid eating large meals or meals 2-3 hours prior to sleeping.  

## 2020-05-13 NOTE — Assessment & Plan Note (Signed)
Blood pressure is stable 

## 2020-05-13 NOTE — Progress Notes (Signed)
Established Patient Office Visit  SUBJECTIVE:  Subjective  Patient ID: Eric QuakerRobert Keeler Jr., male    DOB: 1966-03-28  Age: 54 y.o. MRN: [id number]  CC:  Chief Complaint  Patient presents with  . Lab results    HPI Eric QuakerRobert Szumski Jr. is a 54 y.o. male presenting today to discuss lab work that was done on 05/07/2020. He had a CBC with differential/platelet, CMP, lipid panel, TSH, and PSA. All results were within normal limits with the exception of elevated glucose at 134, elevated cholesterol at 203, elevated triglycerides at 240, and elevated LDL cholesterol at 130. He takes Fenofibrate as prescribed. However, he does not take any medications to control blood sugar. He is no longer taking any medications for his HTN. GERD is controlled with diet.  Today, the patient reports joint pain in fingers secondary to arthritis. He also reports polydipsia and feeling anxious.  He quit smoking cigarettes 14 years ago. Does not use chewing tobacco.  He does have a family history of DM.  Past Medical History:  Diagnosis Date  . Arthritis   . Blood transfusion   . GERD (gastroesophageal reflux disease)   . Hypertension   . Peptic ulcer     Past Surgical History:  Procedure Laterality Date  . BUNIONECTOMY    . CARPAL TUNNEL RELEASE     right  . CERVICAL FUSION    . COLONOSCOPY N/A 03/04/2015   Procedure: COLONOSCOPY;  Surgeon: Kieth BrightlySeeplaputhur G Sankar, MD;  Location: [facility];  Service: Endoscopy;  Laterality: N/A;  . HAMMER TOE SURGERY    . POSTERIOR CERVICAL FUSION/FORAMINOTOMY  03/16/2012   Procedure: POSTERIOR CERVICAL FUSION/FORAMINOTOMY LEVEL 1;  Surgeon: Tia Alertavid S Jones, MD;  Location: [facility];  Service: Neurosurgery;  Laterality: N/A;  Cervical six-seven posterior cervical fusion with lateral mass screws    History reviewed. No pertinent family history.  Social History   Socioeconomic History  . Marital status: Married    Spouse name: Not on file  . Number of children: Not on  file  . Years of education: Not on file  . Highest education level: Not on file  Occupational History  . Not on file  Tobacco Use  . Smoking status: Former Smoker    Packs/day: 0.40    Years: 18.00    Pack years: 7.20    Types: Cigarettes    Quit date: 01/27/2006    Years since quitting: 14.3  . Smokeless tobacco: Never Used  Substance and Sexual Activity  . Alcohol use: Yes    Comment: occasionally  . Drug use: No  . Sexual activity: Not on file  Other Topics Concern  . Not on file  Social History Narrative  . Not on file   Social Determinants of Health   Financial Resource Strain:   . Difficulty of Paying Living Expenses:   Food Insecurity:   . Worried About Programme researcher, broadcasting/film/videounning Out of Food in the Last Year:   . Baristaan Out of Food in the Last Year:   Transportation Needs:   . Freight forwarderLack of Transportation (Medical):   Marland Kitchen. Lack of Transportation (Non-Medical):   Physical Activity:   . Days of Exercise per Week:   . Minutes of Exercise per Session:   Stress:   . Feeling of Stress :   Social Connections:   . Frequency of Communication with Friends and Family:   . Frequency of Social Gatherings with Friends and Family:   . Attends Religious Services:   . Active Member of  Clubs or Organizations:   . Attends Banker Meetings:   Marland Kitchen Marital Status:   Intimate Partner Violence:   . Fear of Current or Ex-Partner:   . Emotionally Abused:   Marland Kitchen Physically Abused:   . Sexually Abused:      Current Outpatient Medications:  .  HYDROcodone-acetaminophen (NORCO) 5-325 MG per tablet, Take 1 tablet by mouth every 6 (six) hours as needed. For pain, Disp: , Rfl:  .  RA VITAMIN D-3 1000 UNITS tablet, Take 1,000 Units by mouth daily. , Disp: , Rfl: 0 .  ranitidine (ZANTAC) 150 MG tablet, Take 150 mg by mouth every morning. , Disp: , Rfl:  .  metFORMIN (GLUCOPHAGE) 500 MG tablet, Take 1 tablet (500 mg total) by mouth 2 (two) times daily with a meal., Disp: 180 tablet, Rfl: 3   Allergies    Allergen Reactions  . Aspirin Nausea Only    ROS Review of Systems  Constitutional: Negative.   HENT: Negative.   Eyes: Negative.   Respiratory: Negative.   Cardiovascular: Negative.   Gastrointestinal: Negative.   Endocrine: Positive for polydipsia.  Genitourinary: Negative.   Musculoskeletal:       Reports joint pain in fingers secondary to arthritis  Skin: Negative.   Allergic/Immunologic: Negative.   Neurological: Negative.   Hematological: Negative.   Psychiatric/Behavioral: The patient is nervous/anxious.   All other systems reviewed and are negative.    OBJECTIVE:    Physical Exam Vitals reviewed.  Constitutional:      Appearance: Normal appearance.  HENT:     Mouth/Throat:     Mouth: Mucous membranes are moist.  Eyes:     Pupils: Pupils are equal, round, and reactive to light.  Neck:     Vascular: No carotid bruit.  Cardiovascular:     Rate and Rhythm: Normal rate and regular rhythm.     Pulses: Normal pulses.     Heart sounds: Normal heart sounds.  Pulmonary:     Effort: Pulmonary effort is normal.     Breath sounds: Normal breath sounds.  Abdominal:     General: Bowel sounds are normal.     Palpations: Abdomen is soft. There is no hepatomegaly, splenomegaly or mass.     Tenderness: There is no abdominal tenderness.     Hernia: No hernia is present.  Musculoskeletal:     Cervical back: Neck supple.     Right lower leg: No edema.     Left lower leg: No edema.  Skin:    Findings: No rash.  Neurological:     Mental Status: He is alert and oriented to person, place, and time.     Motor: No weakness.  Psychiatric:        Mood and Affect: Mood normal.        Behavior: Behavior normal.     BP 119/74   Pulse 94   Ht 5\' 11"  (1.803 m)   Wt 199 lb (90.3 kg)   BMI 27.75 kg/m  Wt Readings from Last 3 Encounters:  05/13/20 199 lb (90.3 kg)  05/07/20 199 lb 4.8 oz (90.4 kg)  09/13/17 206 lb (93.4 kg)    Health Maintenance Due  Topic Date Due   . Hepatitis C Screening  Never done  . URINE MICROALBUMIN  Never done  . COVID-19 Vaccine (1) Never done  . HIV Screening  Never done  . TETANUS/TDAP  Never done    There are no preventive care reminders to display for this  patient.  CBC Latest Ref Rng & Units 05/07/2020 09/13/2017 09/13/2016  WBC 3.8 - 10.8 Thousand/uL 6.3 6.4 6.0  Hemoglobin 13.2 - 17.1 g/dL 54.0 08.6 76.1  Hematocrit 38 - 50 % 44.7 43.4 44.2  Platelets 140 - 400 Thousand/uL 247 255 272   CMP Latest Ref Rng & Units 05/07/2020 09/13/2017 09/13/2016  Glucose 65 - 99 mg/dL 950(D) 326(Z) 124(P)  BUN 7 - 25 mg/dL 11 15 14   Creatinine 0.70 - 1.33 mg/dL 8.09 9.83  Sodium 135 - 146 mmol/L 140 139 139  Potassium 3.5 - 5.3 mmol/L 4.2 4.1 4.1  Chloride 98 - 110 mmol/L 104 105 106  CO2 20 - 32 mmol/L 21 27 26   Calcium 8.6 - 10.3 mg/dL 9.6 9.6 9.2  Total Protein 6.1 - 8.1 g/dL 7.1 - -  Total Bilirubin 0.2 - 1.2 mg/dL 0.7 - -  AST 10 - 35 U/L 34 - -  ALT 9 - 46 U/L 41 - -    Lab Results  Component Value Date   TSH 2.18 05/07/2020   Lab Results  Component Value Date   ANIONGAP 7 09/13/2017   Lab Results  Component Value Date   CHOL 203 (H) 05/07/2020   CHOL 164 09/13/2016   HDL 34 (L) 05/07/2020   HDL 34 (L) 09/13/2016   LDLCALC 130 (H) 05/07/2020   LDLCALC 99 09/13/2016   CHOLHDL 6.0 (H) 05/07/2020   CHOLHDL 4.8 09/13/2016   Lab Results  Component Value Date   TRIG 240 (H) 05/07/2020   No results found for: HGBA1C    ASSESSMENT & PLAN:   Problem List Items Addressed This Visit      Cardiovascular and Mediastinum   Hypertension    Blood pressure is stable.        Digestive   GERD (gastroesophageal reflux disease)    - The patient's GERD is not stable on medication.  - Instructed the patient to avoid eating spicy and acidic foods, as well as foods high in fat. - Instructed the patient to avoid eating large meals or meals 2-3 hours prior to sleeping.         Musculoskeletal and  Integument   Arthritis    He has arthritis of fingers on bilateral hands with most stiffness in the morning. There is no evidence of RA.         Other   Mixed dyslipidemia    The patient is taking Fenofibrate, but his triglycerides are still elevated. He was advised to take two capsules of fish oil twice daily. His blood sugar is close to 135. I started him on Glucophage 500 mg to start twice daily. Will also check Hemoglobin A1C and check his urine.       Other Visit Diagnoses    Diabetes 1.5, managed as type 2 (HCC)    -  Primary   Relevant Medications   metFORMIN (GLUCOPHAGE) 500 MG tablet      Meds ordered this encounter  Medications  . metFORMIN (GLUCOPHAGE) 500 MG tablet    Sig: Take 1 tablet (500 mg total) by mouth 2 (two) times daily with a meal.    Dispense:  180 tablet    Refill:  3    Follow-up: Return in about 3 months (around 08/13/2020).    Dr. 05/09/2020 [facility] [address], [place], [address] [place]   By signing my name below, I, [place], attest that this documentation has been prepared under the direction  and in the presence of Corky Downs, MD. Electronically Signed: Corky Downs, MD 05/13/20, 10:29 AM   I personally performed the services described in this documentation, which was SCRIBED in my presence. The recorded information has been reviewed and considered accurate. It has been edited as necessary during review. Corky Downs, MD

## 2020-05-13 NOTE — Assessment & Plan Note (Signed)
He has arthritis of fingers on bilateral hands with most stiffness in the morning. There is no evidence of RA.

## 2020-05-13 NOTE — Addendum Note (Signed)
Addended by: Jobie Quaker on: 05/13/2020 04:24 PM   Modules accepted: Orders

## 2020-05-13 NOTE — Assessment & Plan Note (Signed)
The patient is taking Fenofibrate, but his triglycerides are still elevated. He was advised to take two capsules of fish oil twice daily. His blood sugar is close to 135. I started him on Glucophage 500 mg to start twice daily. Will also check Hemoglobin A1C and check his urine.

## 2020-05-14 LAB — COMPLETE METABOLIC PANEL WITH GFR
AG Ratio: 1.8 (calc) (ref 1.0–2.5)
ALT: 41 U/L (ref 9–46)
AST: 34 U/L (ref 10–35)
Albumin: 4.6 g/dL (ref 3.6–5.1)
Alkaline phosphatase (APISO): 70 U/L (ref 35–144)
BUN: 11 mg/dL (ref 7–25)
CO2: 21 mmol/L (ref 20–32)
Calcium: 9.6 mg/dL (ref 8.6–10.3)
Chloride: 104 mmol/L (ref 98–110)
Creat: 1.08 mg/dL (ref 0.70–1.33)
GFR, Est African American: 90 mL/min/{1.73_m2} (ref >=60)
GFR, Est Non African American: 78 mL/min/{1.73_m2} (ref >=60)
Globulin: 2.5 g/dL (calc) (ref 1.9–3.7)
Glucose, Bld: 134 mg/dL — ABNORMAL HIGH (ref 65–99)
Potassium: 4.2 mmol/L (ref 3.5–5.3)
Sodium: 140 mmol/L (ref 135–146)
Total Bilirubin: 0.7 mg/dL (ref 0.2–1.2)
Total Protein: 7.1 g/dL (ref 6.1–8.1)

## 2020-05-14 LAB — CBC WITH DIFFERENTIAL/PLATELET
Absolute Monocytes: 561 cells/uL (ref 200–950)
Basophils Absolute: 69 cells/uL (ref 0–200)
Basophils Relative: 1.1 %
Eosinophils Absolute: 151 cells/uL (ref 15–500)
Eosinophils Relative: 2.4 %
HCT: 44.7 % (ref 38.5–50.0)
Hemoglobin: 15.1 g/dL (ref 13.2–17.1)
Lymphs Abs: 2627 cells/uL (ref 850–3900)
MCH: 32.1 pg (ref 27.0–33.0)
MCHC: 33.8 g/dL (ref 32.0–36.0)
MCV: 95.1 fL (ref 80.0–100.0)
MPV: 10.3 fL (ref 7.5–12.5)
Monocytes Relative: 8.9 %
Neutro Abs: 2892 cells/uL (ref 1500–7800)
Neutrophils Relative %: 45.9 %
Platelets: 247 10*3/uL (ref 140–400)
RBC: 4.7 10*6/uL (ref 4.20–5.80)
RDW: 12.6 % (ref 11.0–15.0)
Total Lymphocyte: 41.7 %
WBC: 6.3 10*3/uL (ref 3.8–10.8)

## 2020-05-14 LAB — TEST AUTHORIZATION

## 2020-05-14 LAB — LIPID PANEL
Cholesterol: 203 mg/dL — ABNORMAL HIGH (ref <200)
HDL: 34 mg/dL — ABNORMAL LOW (ref >=40)
LDL Cholesterol (Calc): 130 mg/dL (calc) — ABNORMAL HIGH
Non-HDL Cholesterol (Calc): 169 mg/dL (calc) — ABNORMAL HIGH (ref <130)
Total CHOL/HDL Ratio: 6 (calc) — ABNORMAL HIGH (ref <5.0)
Triglycerides: 240 mg/dL — ABNORMAL HIGH (ref <150)

## 2020-05-14 LAB — HEMOGLOBIN A1C W/OUT EAG: Hgb A1c MFr Bld: 6.9 % of total Hgb — ABNORMAL HIGH (ref <5.7)

## 2020-05-14 LAB — MICROALBUMIN, URINE: Microalb, Ur: 0.3 mg/dL

## 2020-05-14 LAB — PSA: PSA: 0.3 ng/mL (ref <=4.0)

## 2020-05-14 LAB — TSH: TSH: 2.18 mIU/L (ref 0.40–4.50)

## 2020-05-20 ENCOUNTER — Other Ambulatory Visit: Payer: Self-pay | Admitting: Internal Medicine

## 2020-05-23 ENCOUNTER — Other Ambulatory Visit: Payer: Self-pay | Admitting: *Deleted

## 2020-05-23 MED ORDER — METFORMIN HCL ER 500 MG PO TB24: 500.0000 mg | ORAL_TABLET | Freq: Every day | ORAL | 6 refills | Status: DC

## 2020-06-02 ENCOUNTER — Other Ambulatory Visit: Payer: Self-pay

## 2020-06-02 MED ORDER — RA VITAMIN D-3 25 MCG (1000 UT) PO TABS: 1000.0000 [IU] | ORAL_TABLET | Freq: Every day | ORAL | 3 refills | Status: DC

## 2020-07-15 IMAGING — MR MR CERVICAL SPINE W/O CM
4 of 5 series · 26 of 48 positions shown · non-contrast
Comparison: MRI cervical spine dated February 26, 2014.

CLINICAL DATA: Chronic neck and bilateral shoulder pain with
numbness and weakness of both arms and hands.

EXAM:
MRI CERVICAL SPINE WITHOUT CONTRAST
TECHNIQUE: Multiplanar, multisequence MR imaging of the cervical spine was
performed. No intravenous contrast was administered.

[Series 3: T1 · sagittal · 3.0mm · 0.41mm/px · 6 of 17 slices shown]
[im 1/17]
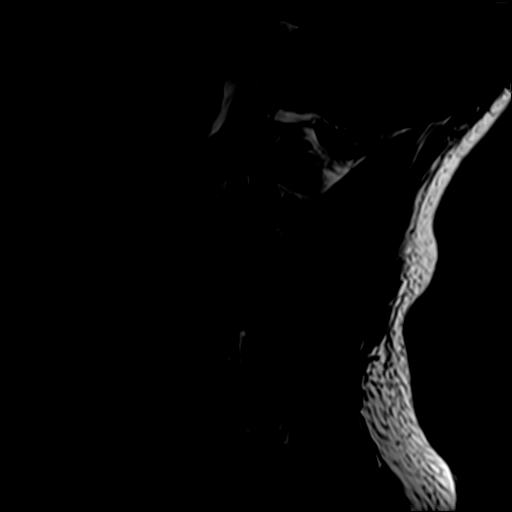
[im 3/17]
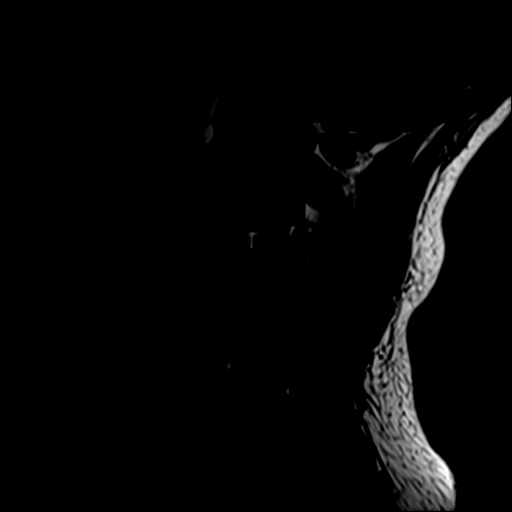
[im 5/17]
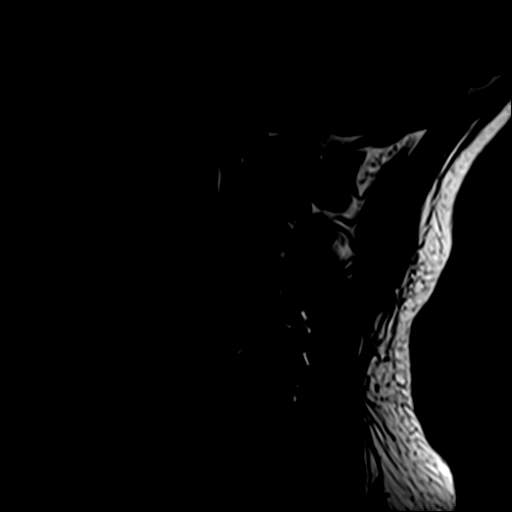
[im 7/17]
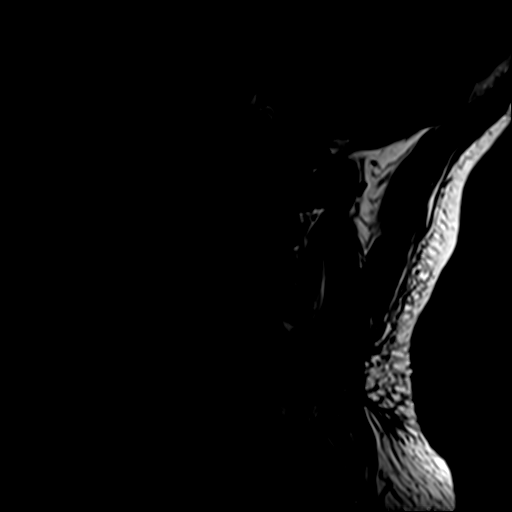
[im 10/17]
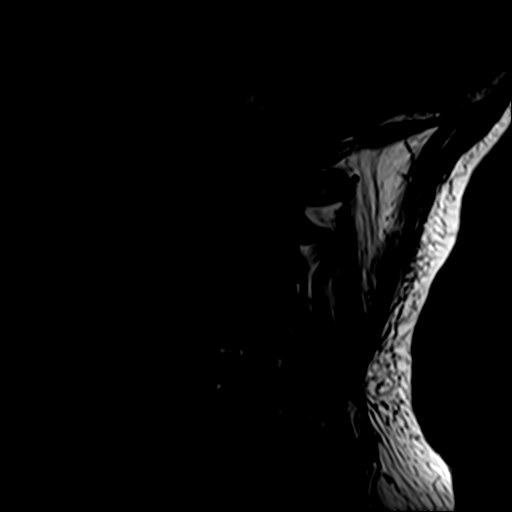
[im 14/17]
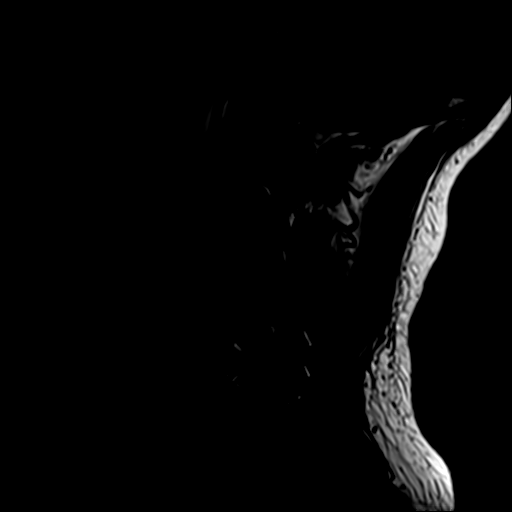

[Series 4: T2 · sagittal · 3.0mm · 0.41mm/px · 8 of 17 slices shown (1 of 2)]
[im 1/17]
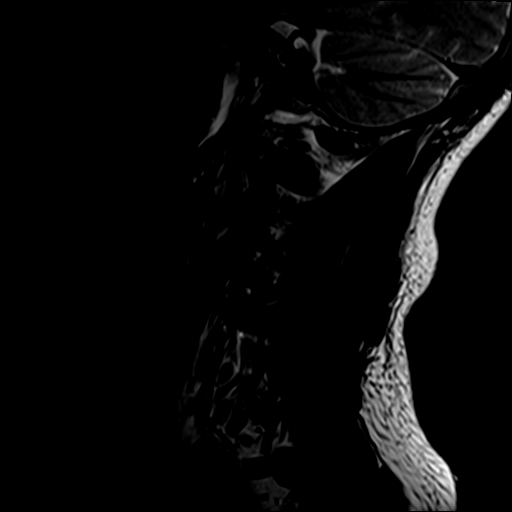
[im 3/17]
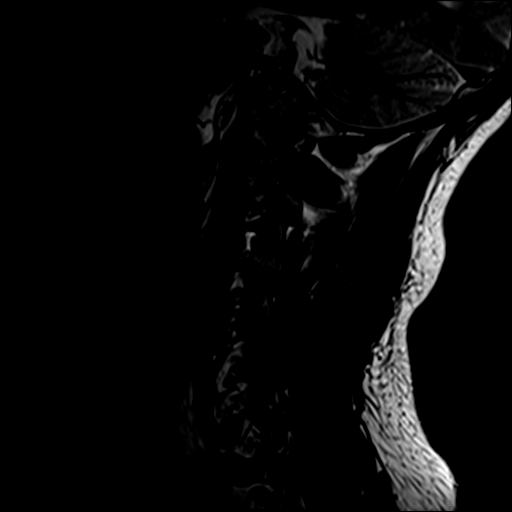
[im 5/17]
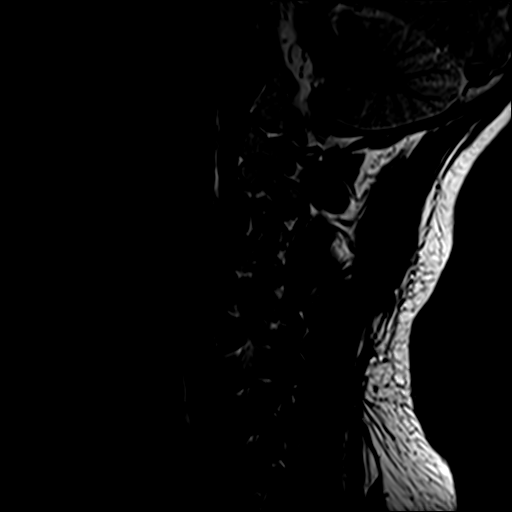
[im 7/17]
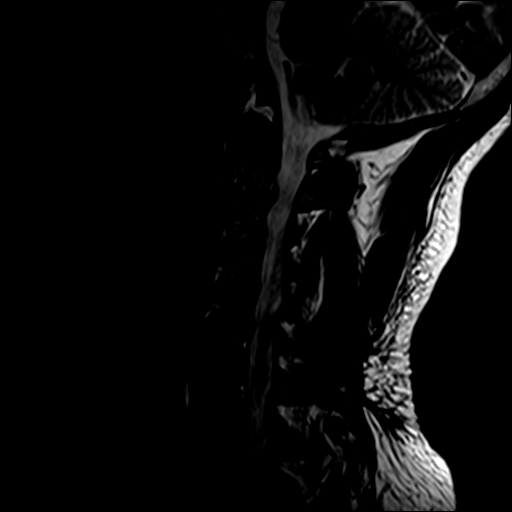
[im 10/17]
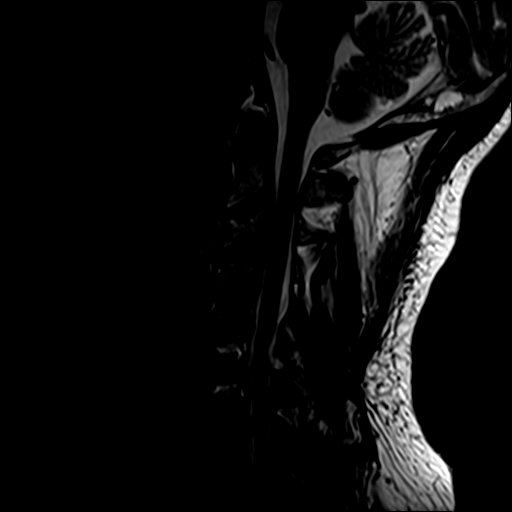
[im 12/17]
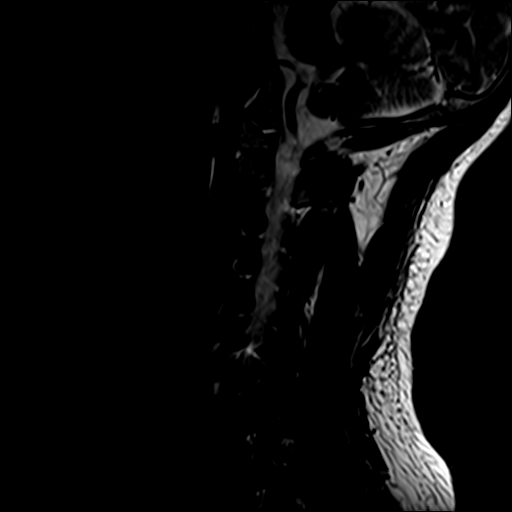
[im 14/17]
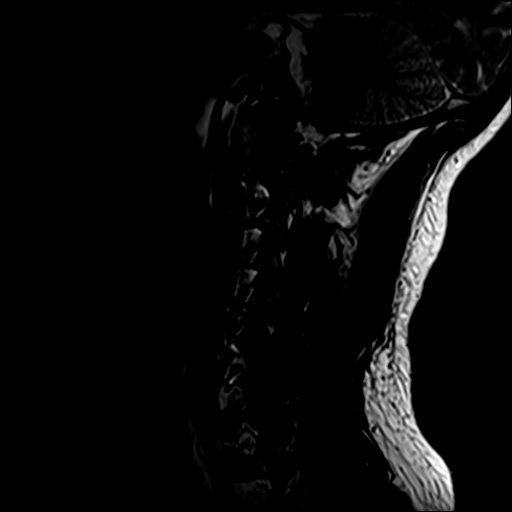
[im 17/17]
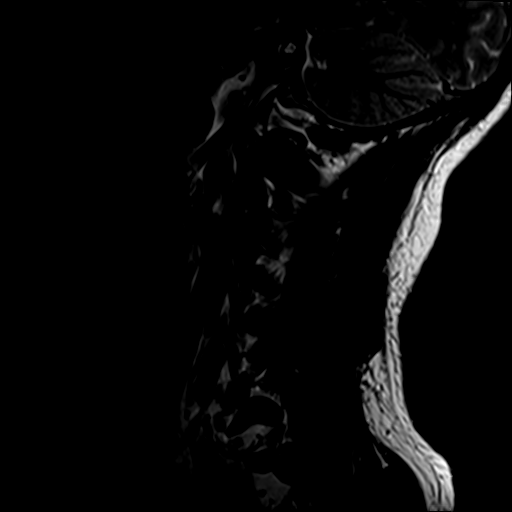

[Series 5: STIR · sagittal · 3.0mm · 0.82mm/px · 3 of 17 slices shown]
[im 3/17]
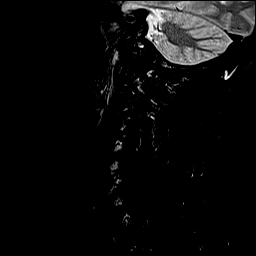
[im 10/17]
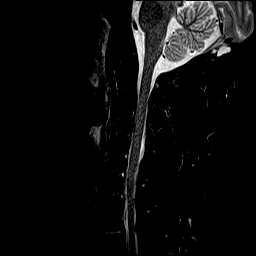
[im 14/17]
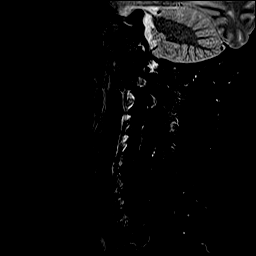

[Series 7: T2 · axial · 3.0mm · 0.70mm/px · z∈[-65,+24]mm · 9 of 26 slices shown (2 of 2)]
[im 1/26]
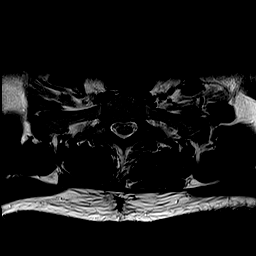
[im 5/26]
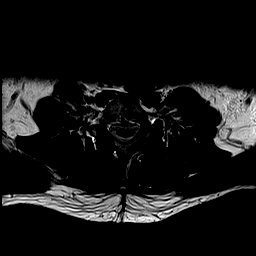
[im 7/26]
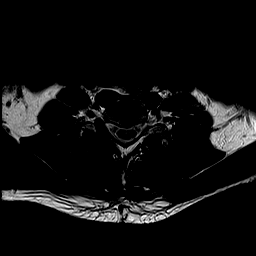
[im 12/26]
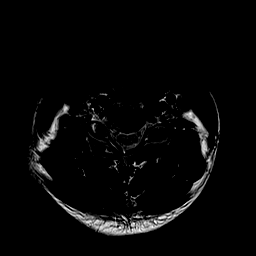
[im 14/26]
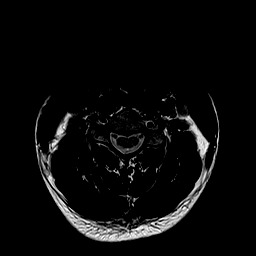
[im 19/26]
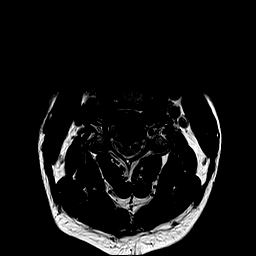
[im 21/26]
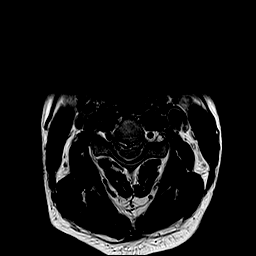
[im 23/26]
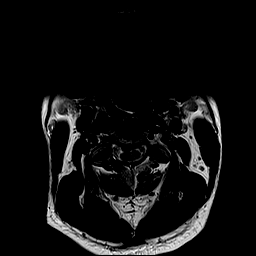
[im 26/26]
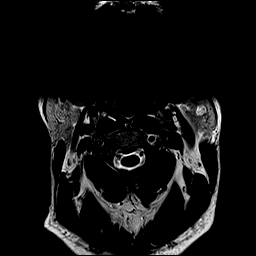

[26 of 48 positions shown; findings below may reference images not displayed]

FINDINGS: Alignment: Unchanged straightening and slight reversal of the normal
cervical lordosis. Unchanged trace retrolisthesis at C5-C6.

Vertebrae: Prior C6-C7 interbody and posterior fusion. No acute
fracture, evidence of discitis, or focal bone lesion.

Cord: Normal signal.

Posterior Fossa, vertebral arteries, paraspinal tissues: Negative.

Disc levels:

C2-C3: Interval enlargement of the right paracentral disc protrusion
with increased mass effect and flattening of the right cord. New
mild central spinal canal stenosis. Unchanged mild right
uncovertebral hypertrophy. No neuroforaminal stenosis.

C3-C4: Interval enlargement of the right paracentral disc protrusion
with increased mass effect and flattening of the right cord. No
spinal canal or neuroforaminal stenosis.

C4-C5: Slightly increased small shallow right paracentral disc
protrusion with new minimal flattening of the right ventral cord.
Unchanged mild right neuroforaminal stenosis. No spinal canal or
left neuroforaminal stenosis.

C5-C6: Unchanged tiny shallow central disc protrusion and mild right
uncovertebral hypertrophy. Unchanged mild right neuroforaminal
stenosis. No spinal canal or left neuroforaminal stenosis.

C6-C7: Prior interbody and posterior fusion. No spinal canal or
neuroforaminal stenosis.

C7-T1: New minimal disc bulging eccentric to the left. Unchanged
mild left facet uncovertebral hypertrophy and mild left
neuroforaminal stenosis. No spinal canal or right neuroforaminal
stenosis.
IMPRESSION: 1. Enlarging right paracentral disc protrusions at C2-C3 and C3-C4
with increased mass effect and flattening of the right cord. New
mild central spinal canal stenosis at C2-C3.
2. Slightly increased shallow right paracentral disc protrusion at
C4-C5 with new minimal flattening of the right ventral cord.
Unchanged mild right neuroforaminal stenosis.
3. Unchanged mild neuroforaminal stenosis on the right at C5-C6 and
on the left at C7-T1.

## 2020-08-12 ENCOUNTER — Ambulatory Visit: Payer: 59 | Admitting: Internal Medicine

## 2020-08-12 ENCOUNTER — Other Ambulatory Visit: Payer: Self-pay

## 2020-08-12 ENCOUNTER — Encounter: Payer: Self-pay | Admitting: Internal Medicine

## 2020-08-12 VITALS — BP 130/73 | HR 68 | Ht 72.0 in | Wt 198.6 lb

## 2020-08-12 DIAGNOSIS — K219 Gastro-esophageal reflux disease without esophagitis: Secondary | ICD-10-CM

## 2020-08-12 DIAGNOSIS — I10 Essential (primary) hypertension: Secondary | ICD-10-CM | POA: Diagnosis not present

## 2020-08-12 DIAGNOSIS — E782 Mixed hyperlipidemia: Secondary | ICD-10-CM | POA: Diagnosis not present

## 2020-08-12 DIAGNOSIS — E119 Type 2 diabetes mellitus without complications: Secondary | ICD-10-CM | POA: Diagnosis not present

## 2020-08-12 DIAGNOSIS — E1169 Type 2 diabetes mellitus with other specified complication: Secondary | ICD-10-CM | POA: Insufficient documentation

## 2020-08-12 LAB — GLUCOSE, POCT (MANUAL RESULT ENTRY): POC Glucose: 101 mg/dl — AB (ref 70–99)

## 2020-08-12 NOTE — Assessment & Plan Note (Signed)
-   The patient's blood sugar is under control on metformin. - The patient will continue the current treatment regimen.  - I encouraged the patient to regularly check blood sugar.  - I encouraged the patient to monitor diet. I encouraged the patient to eat low-carb and low-sugar to help prevent blood sugar spikes.  - I encouraged the patient to continue following their prescribed treatment plan for diabetes - I informed the patient to get help if blood sugar drops below 54mg/dL, or if suddenly have trouble thinking clearly or breathing.     

## 2020-08-12 NOTE — Assessment & Plan Note (Signed)
-   The patient's GERD is stable on medication.  - Instructed the patient to avoid eating spicy and acidic foods, as well as foods high in fat. - Instructed the patient to avoid eating large meals or meals 2-3 hours prior to sleeping. 

## 2020-08-12 NOTE — Progress Notes (Addendum)
Established Patient Office Visit  Subjective:  Patient ID: Eric Owen., male    DOB: 1966-02-07  Age: 54 y.o. MRN: [id number]  CC:  Chief Complaint  Patient presents with  . Diabetes    Diabetes He has type 2 diabetes mellitus. His disease course has been improving. Pertinent negatives for hypoglycemia include no confusion, dizziness, headaches, hunger, mood changes, nervousness/anxiousness, pallor, seizures, sleepiness, speech difficulty, sweats or tremors. Pertinent negatives for diabetes include no blurred vision, no chest pain, no fatigue, no foot paresthesias, no foot ulcerations, no polydipsia, no polyphagia, no polyuria, no visual change, no weakness and no weight loss.    [name] presents for diabetes  Past Medical History:  Diagnosis Date  . Arthritis   . Blood transfusion   . GERD (gastroesophageal reflux disease)   . Hypertension   . Peptic ulcer     Past Surgical History:  Procedure Laterality Date  . BUNIONECTOMY    . CARPAL TUNNEL RELEASE     right  . CERVICAL FUSION    . COLONOSCOPY N/A 03/04/2015   Procedure: COLONOSCOPY;  Surgeon: Kieth Brightly, MD;  Location: [facility];  Service: Endoscopy;  Laterality: N/A;  . HAMMER TOE SURGERY    . POSTERIOR CERVICAL FUSION/FORAMINOTOMY  03/16/2012   Procedure: POSTERIOR CERVICAL FUSION/FORAMINOTOMY LEVEL 1;  Surgeon: Tia Alert, MD;  Location: [facility];  Service: Neurosurgery;  Laterality: N/A;  Cervical six-seven posterior cervical fusion with lateral mass screws    History reviewed. No pertinent family history.  Social History   Socioeconomic History  . Marital status: Married    Spouse name: Not on file  . Number of children: Not on file  . Years of education: Not on file  . Highest education level: Not on file  Occupational History  . Not on file  Tobacco Use  . Smoking status: Former Smoker    Packs/day: 0.40    Years: 18.00    Pack years: 7.20    Types: Cigarettes     Quit date: 01/27/2006    Years since quitting: 14.6  . Smokeless tobacco: Never Used  Substance and Sexual Activity  . Alcohol use: Yes    Comment: occasionally  . Drug use: No  . Sexual activity: Not on file  Other Topics Concern  . Not on file  Social History Narrative  . Not on file   Social Determinants of Health   Financial Resource Strain:   . Difficulty of Paying Living Expenses: Not on file  Food Insecurity:   . Worried About Programme researcher, broadcasting/film/video in the Last Year: Not on file  . Ran Out of Food in the Last Year: Not on file  Transportation Needs:   . Lack of Transportation (Medical): Not on file  . Lack of Transportation (Non-Medical): Not on file  Physical Activity:   . Days of Exercise per Week: Not on file  . Minutes of Exercise per Session: Not on file  Stress:   . Feeling of Stress : Not on file  Social Connections:   . Frequency of Communication with Friends and Family: Not on file  . Frequency of Social Gatherings with Friends and Family: Not on file  . Attends Religious Services: Not on file  . Active Member of Clubs or Organizations: Not on file  . Attends Banker Meetings: Not on file  . Marital Status: Not on file  Intimate Partner Violence:   . Fear of Current or Ex-Partner: Not  on file  . Emotionally Abused: Not on file  . Physically Abused: Not on file  . Sexually Abused: Not on file     Current Outpatient Medications:  .  fenofibrate 160 MG tablet, TAKE 1 TABLET BY MOUTH DAILY.., Disp: 30 tablet, Rfl: 6 .  HYDROcodone-acetaminophen (NORCO) 5-325 MG per tablet, Take 1 tablet by mouth every 6 (six) hours as needed. For pain, Disp: , Rfl:  .  metFORMIN (GLUCOPHAGE XR) 500 MG 24 hr tablet, Take 1 tablet (500 mg total) by mouth daily with breakfast., Disp: 30 tablet, Rfl: 6 .  RA VITAMIN D-3 25 MCG (1000 UT) tablet, Take 1 tablet (1,000 Units total) by mouth daily., Disp: 90 tablet, Rfl: 3 .  ranitidine (ZANTAC) 150 MG tablet, Take 150 mg  by mouth every morning. , Disp: , Rfl:    Allergies  Allergen Reactions  . Aspirin Nausea Only    ROS Review of Systems  Constitutional: Negative for fatigue and weight loss.  Eyes: Negative for blurred vision.  Cardiovascular: Negative for chest pain.  Endocrine: Negative for polydipsia, polyphagia and polyuria.  Skin: Negative for pallor.  Neurological: Negative for dizziness, tremors, seizures, speech difficulty, weakness and headaches.  Psychiatric/Behavioral: Negative for confusion. The patient is not nervous/anxious.       Objective:    Physical Exam  BP 130/73   Pulse 68   Ht 6' (1.829 m)   Wt 198 lb 9.6 oz (90.1 kg)   BMI 26.94 kg/m  Wt Readings from Last 3 Encounters:  08/12/20 198 lb 9.6 oz (90.1 kg)  05/13/20 199 lb (90.3 kg)  05/07/20 199 lb 4.8 oz (90.4 kg)     Health Maintenance Due  Topic Date Due  . Hepatitis C Screening  Never done  . PNEUMOCOCCAL POLYSACCHARIDE VACCINE AGE 61-64 HIGH RISK  Never done  . FOOT EXAM  Never done  . OPHTHALMOLOGY EXAM  Never done  . COVID-19 Vaccine (1) Never done  . HIV Screening  Never done  . TETANUS/TDAP  Never done  . INFLUENZA VACCINE  Never done    There are no preventive care reminders to display for this patient.  Lab Results  Component Value Date   TSH 2.18 05/07/2020   Lab Results  Component Value Date   WBC 6.3 05/07/2020   HGB 15.1 05/07/2020   HCT 44.7 05/07/2020   MCV 95.1 05/07/2020   PLT 247 05/07/2020   Lab Results  Component Value Date   NA 140 05/07/2020   K 4.2 05/07/2020   CO2 21 05/07/2020   GLUCOSE 134 (H) 05/07/2020   BUN 11 05/07/2020   CREATININE 1.08 05/07/2020   BILITOT 0.7 05/07/2020   AST 34 05/07/2020   ALT 41 05/07/2020   PROT 7.1 05/07/2020   CALCIUM 9.6 05/07/2020   ANIONGAP 7 09/13/2017   Lab Results  Component Value Date   CHOL 203 (H) 05/07/2020   Lab Results  Component Value Date   HDL 34 (L) 05/07/2020   Lab Results  Component Value Date    LDLCALC 130 (H) 05/07/2020   Lab Results  Component Value Date   TRIG 240 (H) 05/07/2020   Lab Results  Component Value Date   CHOLHDL 6.0 (H) 05/07/2020   Lab Results  Component Value Date   HGBA1C 6.9 (H) 05/07/2020      Assessment & Plan:   Problem List Items Addressed This Visit      Cardiovascular and Mediastinum   Essential hypertension    -  Today, the patient's blood pressure is well managed on diet. - The patient will continue the current treatment regimen.  - I encouraged the patient to eat a low-sodium diet to help control blood pressure. - I encouraged the patient to live an active lifestyle and complete activities that increases heart rate to 85% target heart rate at least 5 times per week for one hour.            Digestive   GERD (gastroesophageal reflux disease)    - The patient's GERD is stable on medication.  - Instructed the patient to avoid eating spicy and acidic foods, as well as foods high in fat. - Instructed the patient to avoid eating large meals or meals 2-3 hours prior to sleeping.         Endocrine   Type 2 diabetes mellitus without complication, without long-term current use of insulin (HCC) - Primary    - The patient's blood sugar is under control on metformin. - The patient will continue the current treatment regimen.  - I encouraged the patient to regularly check blood sugar.  - I encouraged the patient to monitor diet. I encouraged the patient to eat low-carb and low-sugar to help prevent blood sugar spikes.  - I encouraged the patient to continue following their prescribed treatment plan for diabetes - I informed the patient to get help if blood sugar drops below 54mg /dL, or if suddenly have trouble thinking clearly or breathing.          Relevant Orders   POCT glucose (manual entry) (Completed)     Other   Mixed dyslipidemia    - The patient's hyperlipidemia is stable on fenofibrate. - The patient will continue the current  treatment regimen.  - I encouraged the patient to eat more vegetables and whole wheat, and to avoid fatty foods like whole milk, hard cheese, egg yolks, margarine, baked sweets, and fried foods.  - I encouraged the patient to live an active lifestyle and complete activities for 40 minutes at least three times per week.  - I instructed the patient to go to the ER if they begin having chest pain.           No orders of the defined types were placed in this encounter.   Follow-up: No follow-ups on file.    , MD

## 2020-08-12 NOTE — Assessment & Plan Note (Signed)
-   Today, the patient's blood pressure is well managed on diet. - The patient will continue the current treatment regimen.  - I encouraged the patient to eat a low-sodium diet to help control blood pressure. - I encouraged the patient to live an active lifestyle and complete activities that increases heart rate to 85% target heart rate at least 5 times per week for one hour.     

## 2020-08-12 NOTE — Assessment & Plan Note (Signed)
-   The patient's hyperlipidemia is stable on fenofibrate. - The patient will continue the current treatment regimen.  - I encouraged the patient to eat more vegetables and whole wheat, and to avoid fatty foods like whole milk, hard cheese, egg yolks, margarine, baked sweets, and fried foods.  - I encouraged the patient to live an active lifestyle and complete activities for 40 minutes at least three times per week.  - I instructed the patient to go to the ER if they begin having chest pain.

## 2020-08-13 ENCOUNTER — Ambulatory Visit: Payer: 59 | Admitting: Internal Medicine

## 2020-09-15 ENCOUNTER — Other Ambulatory Visit: Payer: Self-pay

## 2020-09-15 ENCOUNTER — Ambulatory Visit (INDEPENDENT_AMBULATORY_CARE_PROVIDER_SITE_OTHER): Payer: 59 | Admitting: Family Medicine

## 2020-09-15 ENCOUNTER — Encounter: Payer: Self-pay | Admitting: Family Medicine

## 2020-09-15 VITALS — BP 125/80 | HR 62 | Ht 72.0 in | Wt 197.0 lb

## 2020-09-15 DIAGNOSIS — E119 Type 2 diabetes mellitus without complications: Secondary | ICD-10-CM

## 2020-09-15 DIAGNOSIS — E782 Mixed hyperlipidemia: Secondary | ICD-10-CM | POA: Diagnosis not present

## 2020-09-15 LAB — GLUCOSE, POCT (MANUAL RESULT ENTRY): POC Glucose: 176 mg/dl — AB (ref 70–99)

## 2020-09-15 LAB — POCT GLYCOSYLATED HEMOGLOBIN (HGB A1C): HbA1c POC (<> result, manual entry): 5.6 % (ref 4.0–5.6)

## 2020-09-15 MED ORDER — ROSUVASTATIN CALCIUM 10 MG PO TABS: 10.0000 mg | ORAL_TABLET | Freq: Every day | ORAL | 3 refills | Status: DC

## 2020-09-15 NOTE — Assessment & Plan Note (Signed)
Diabetes mellitus Type II, under excellent control.. Discussed general issues about diabetes pathophysiology and management. Suggested low cholesterol diet. Continued metformin; see  medication orders.  Discussed need to maintain low A1C considering his young age and need for lowest A1C possible.  Discussed low carb diet as well.

## 2020-09-15 NOTE — Assessment & Plan Note (Signed)
-   Pt's HLD is stable on rosuvastatin (Crestor). - Education regarding hyperlipidemia was given. Questions answered. Encouraged regular exercise. Discussed diet modification.  Starting Crestor today 10 mg, discussed labs and CVD prevention with DM II. Will fu 3 months for Lipid panel.

## 2020-09-15 NOTE — Progress Notes (Signed)
Established Patient Office Visit  SUBJECTIVE:  Subjective  Patient ID: Eric Owen., male    DOB: 1966-02-11  Age: 54 y.o. MRN: [id number]  CC:  Chief Complaint  Patient presents with  . Diabetes    patient was told to stop metformin at last visit because BS low, but since patient has restarted metformin becuase sugar was in the 160's     HPI Eric Owen. is a 54 y.o. male presenting today for     Past Medical History:  Diagnosis Date  . Arthritis   . Blood transfusion   . GERD (gastroesophageal reflux disease)   . Hypertension   . Peptic ulcer     Past Surgical History:  Procedure Laterality Date  . BUNIONECTOMY    . CARPAL TUNNEL RELEASE     right  . CERVICAL FUSION    . COLONOSCOPY N/A 03/04/2015   Procedure: COLONOSCOPY;  Surgeon: Kieth Brightly, MD;  Location: [facility];  Service: Endoscopy;  Laterality: N/A;  . HAMMER TOE SURGERY    . POSTERIOR CERVICAL FUSION/FORAMINOTOMY  03/16/2012   Procedure: POSTERIOR CERVICAL FUSION/FORAMINOTOMY LEVEL 1;  Surgeon: Tia Alert, MD;  Location: [facility];  Service: Neurosurgery;  Laterality: N/A;  Cervical six-seven posterior cervical fusion with lateral mass screws    History reviewed. No pertinent family history.  Social History   Socioeconomic History  . Marital status: Married    Spouse name: Not on file  . Number of children: Not on file  . Years of education: Not on file  . Highest education level: Not on file  Occupational History  . Not on file  Tobacco Use  . Smoking status: Former Smoker    Packs/day: 0.40    Years: 18.00    Pack years: 7.20    Types: Cigarettes    Quit date: 01/27/2006    Years since quitting: 14.6  . Smokeless tobacco: Never Used  Substance and Sexual Activity  . Alcohol use: Yes    Comment: occasionally  . Drug use: No  . Sexual activity: Not on file  Other Topics Concern  . Not on file  Social History Narrative  . Not on file   Social Determinants of  Health   Financial Resource Strain:   . Difficulty of Paying Living Expenses: Not on file  Food Insecurity:   . Worried About Programme researcher, broadcasting/film/video in the Last Year: Not on file  . Ran Out of Food in the Last Year: Not on file  Transportation Needs:   . Lack of Transportation (Medical): Not on file  . Lack of Transportation (Non-Medical): Not on file  Physical Activity:   . Days of Exercise per Week: Not on file  . Minutes of Exercise per Session: Not on file  Stress:   . Feeling of Stress : Not on file  Social Connections:   . Frequency of Communication with Friends and Family: Not on file  . Frequency of Social Gatherings with Friends and Family: Not on file  . Attends Religious Services: Not on file  . Active Member of Clubs or Organizations: Not on file  . Attends Banker Meetings: Not on file  . Marital Status: Not on file  Intimate Partner Violence:   . Fear of Current or Ex-Partner: Not on file  . Emotionally Abused: Not on file  . Physically Abused: Not on file  . Sexually Abused: Not on file     Current Outpatient Medications:  .  fenofibrate 160 MG tablet, TAKE 1 TABLET BY MOUTH DAILY.., Disp: 30 tablet, Rfl: 6 .  HYDROcodone-acetaminophen (NORCO) 5-325 MG per tablet, Take 1 tablet by mouth every 6 (six) hours as needed. For pain, Disp: , Rfl:  .  metFORMIN (GLUCOPHAGE XR) 500 MG 24 hr tablet, Take 1 tablet (500 mg total) by mouth daily with breakfast., Disp: 30 tablet, Rfl: 6 .  RA VITAMIN D-3 25 MCG (1000 UT) tablet, Take 1 tablet (1,000 Units total) by mouth daily., Disp: 90 tablet, Rfl: 3 .  ranitidine (ZANTAC) 150 MG tablet, Take 150 mg by mouth every morning. , Disp: , Rfl:  .  rosuvastatin (CRESTOR) 10 MG tablet, Take 1 tablet (10 mg total) by mouth daily., Disp: 90 tablet, Rfl: 3   Allergies  Allergen Reactions  . Aspirin Nausea Only    ROS Review of Systems  Constitutional: Negative.   HENT: Negative.   Respiratory: Negative.     Cardiovascular: Negative.   Genitourinary: Negative.   Musculoskeletal: Negative.   Skin: Negative.   Psychiatric/Behavioral: Negative.      OBJECTIVE:    Physical Exam HENT:     Mouth/Throat:     Mouth: Mucous membranes are dry.  Cardiovascular:     Rate and Rhythm: Normal rate and regular rhythm.  Pulmonary:     Effort: Pulmonary effort is normal.  Skin:    General: Skin is warm.     Capillary Refill: Capillary refill takes less than 2 seconds.  Neurological:     Mental Status: He is alert.     BP 125/80   Pulse 62   Ht 6' (1.829 m)   Wt 197 lb (89.4 kg)   BMI 26.72 kg/m  Wt Readings from Last 3 Encounters:  09/15/20 197 lb (89.4 kg)  08/12/20 198 lb 9.6 oz (90.1 kg)  05/13/20 199 lb (90.3 kg)    Health Maintenance Due  Topic Date Due  . Hepatitis C Screening  Never done  . PNEUMOCOCCAL POLYSACCHARIDE VACCINE AGE 68-64 HIGH RISK  Never done  . FOOT EXAM  Never done  . OPHTHALMOLOGY EXAM  Never done  . COVID-19 Vaccine (1) Never done  . HIV Screening  Never done  . TETANUS/TDAP  Never done  . INFLUENZA VACCINE  Never done    There are no preventive care reminders to display for this patient.  CBC Latest Ref Rng & Units 05/07/2020 09/13/2017 09/13/2016  WBC 3.8 - 10.8 Thousand/uL 6.3 6.4 6.0  Hemoglobin 13.2 - 17.1 g/dL 40.9 81.1 91.4  Hematocrit 38 - 50 % 44.7 43.4 44.2  Platelets 140 - 400 Thousand/uL 247 255 272   CMP Latest Ref Rng & Units 05/07/2020 09/13/2017 09/13/2016  Glucose 65 - 99 mg/dL 782(N) 562(Z) 308(M)  BUN 7 - 25 mg/dL 11 15 14   Creatinine 0.70 - 1.33 mg/dL 5.78 4.69  Sodium 135 - 146 mmol/L 140 139 139  Potassium 3.5 - 5.3 mmol/L 4.2 4.1 4.1  Chloride 98 - 110 mmol/L 104 105 106  CO2 20 - 32 mmol/L 21 27 26   Calcium 8.6 - 10.3 mg/dL 9.6 9.6 9.2  Total Protein 6.1 - 8.1 g/dL 7.1 - -  Total Bilirubin 0.2 - 1.2 mg/dL 0.7 - -  AST 10 - 35 U/L 34 - -  ALT 9 - 46 U/L 41 - -    Lab Results  Component Value Date   TSH 2.18  05/07/2020   Lab Results  Component Value Date   ANIONGAP 7 09/13/2017  Lab Results  Component Value Date   CHOL 203 (H) 05/07/2020   CHOL 164 09/13/2016   HDL 34 (L) 05/07/2020   HDL 34 (L) 09/13/2016   LDLCALC 130 (H) 05/07/2020   LDLCALC 99 09/13/2016   CHOLHDL 6.0 (H) 05/07/2020   CHOLHDL 4.8 09/13/2016   Lab Results  Component Value Date   TRIG 240 (H) 05/07/2020   Lab Results  Component Value Date   HGBA1C 5.6 09/15/2020   HGBA1C 6.9 (H) 05/07/2020      ASSESSMENT & PLAN:   Problem List Items Addressed This Visit      Endocrine   Type 2 diabetes mellitus without complication, without long-term current use of insulin (HCC) - Primary    Diabetes mellitus Type II, under excellent control.. Discussed general issues about diabetes pathophysiology and management. Suggested low cholesterol diet. Continued metformin; see  medication orders.  Discussed need to maintain low A1C considering his young age and need for lowest A1C possible.  Discussed low carb diet as well.       Relevant Medications   rosuvastatin (CRESTOR) 10 MG tablet   Other Relevant Orders   POCT glucose (manual entry) (Completed)   POCT HgB A1C (Completed)     Other   Mixed dyslipidemia    - Pt's HLD is stable on rosuvastatin (Crestor). - Education regarding hyperlipidemia was given. Questions answered. Encouraged regular exercise. Discussed diet modification.  Starting Crestor today 10 mg, discussed labs and CVD prevention with DM II. Will fu 3 months for Lipid panel.        Relevant Medications   rosuvastatin (CRESTOR) 10 MG tablet      Meds ordered this encounter  Medications  . rosuvastatin (CRESTOR) 10 MG tablet    Sig: Take 1 tablet (10 mg total) by mouth daily.    Dispense:  90 tablet    Refill:  3      Follow-up: No follow-ups on file.    Irish Lack, FNP [facility] [address] [place], [place] [address]

## 2020-12-12 ENCOUNTER — Ambulatory Visit (INDEPENDENT_AMBULATORY_CARE_PROVIDER_SITE_OTHER): Payer: 59 | Admitting: Family Medicine

## 2020-12-12 ENCOUNTER — Encounter: Payer: Self-pay | Admitting: Family Medicine

## 2020-12-12 ENCOUNTER — Other Ambulatory Visit: Payer: Self-pay

## 2020-12-12 VITALS — BP 131/78 | HR 67 | Ht 71.5 in | Wt 196.1 lb

## 2020-12-12 DIAGNOSIS — E119 Type 2 diabetes mellitus without complications: Secondary | ICD-10-CM

## 2020-12-12 DIAGNOSIS — E782 Mixed hyperlipidemia: Secondary | ICD-10-CM | POA: Diagnosis not present

## 2020-12-12 LAB — POCT GLYCOSYLATED HEMOGLOBIN (HGB A1C): HbA1c POC (<> result, manual entry): 6.3 % (ref 4.0–5.6)

## 2020-12-12 NOTE — Assessment & Plan Note (Signed)
Patient not sure if he is tolerating the Crestor well, he thinks that he may have had a simple reaction but wants to try it again so he can decrease his MI and CVA risk.

## 2020-12-12 NOTE — Assessment & Plan Note (Signed)
Patient taking all meds, A1C 6.3 today which is WNL but up from 5.6. Discussed diet and medication compliance, patient feels that his diet was a contributing factor.

## 2020-12-12 NOTE — Progress Notes (Signed)
Established Patient Office Visit  SUBJECTIVE:  Subjective  Patient ID: Eric Cellucci., male    DOB: 1966-03-31  Age: 54 y.o. MRN: [id number]  CC:  Chief Complaint  Patient presents with  . Diabetes    HPI Eric Guillet. is a 55 y.o. male presenting today for     Past Medical History:  Diagnosis Date  . Arthritis   . Blood transfusion   . GERD (gastroesophageal reflux disease)   . Hypertension   . Peptic ulcer     Past Surgical History:  Procedure Laterality Date  . BUNIONECTOMY    . CARPAL TUNNEL RELEASE     right  . CERVICAL FUSION    . COLONOSCOPY N/A 03/04/2015   Procedure: COLONOSCOPY;  Surgeon: Kieth Brightly, MD;  Location: [facility];  Service: Endoscopy;  Laterality: N/A;  . HAMMER TOE SURGERY    . POSTERIOR CERVICAL FUSION/FORAMINOTOMY  03/16/2012   Procedure: POSTERIOR CERVICAL FUSION/FORAMINOTOMY LEVEL 1;  Surgeon: Tia Alert, MD;  Location: [facility];  Service: Neurosurgery;  Laterality: N/A;  Cervical six-seven posterior cervical fusion with lateral mass screws    History reviewed. No pertinent family history.  Social History   Socioeconomic History  . Marital status: Married    Spouse name: Not on file  . Number of children: Not on file  . Years of education: Not on file  . Highest education level: Not on file  Occupational History  . Not on file  Tobacco Use  . Smoking status: Former Smoker    Packs/day: 0.40    Years: 18.00    Pack years: 7.20    Types: Cigarettes    Quit date: 01/27/2006    Years since quitting: 14.8  . Smokeless tobacco: Never Used  Substance and Sexual Activity  . Alcohol use: Yes    Comment: occasionally  . Drug use: No  . Sexual activity: Not on file  Other Topics Concern  . Not on file  Social History Narrative  . Not on file   Social Determinants of Health   Financial Resource Strain: Not on file  Food Insecurity: Not on file  Transportation Needs: Not on file  Physical Activity: Not  on file  Stress: Not on file  Social Connections: Not on file  Intimate Partner Violence: Not on file     Current Outpatient Medications:  .  fenofibrate 160 MG tablet, TAKE 1 TABLET BY MOUTH DAILY.., Disp: 30 tablet, Rfl: 6 .  HYDROcodone-acetaminophen (NORCO) 5-325 MG per tablet, Take 1 tablet by mouth every 6 (six) hours as needed. For pain, Disp: , Rfl:  .  metFORMIN (GLUCOPHAGE XR) 500 MG 24 hr tablet, Take 1 tablet (500 mg total) by mouth daily with breakfast., Disp: 30 tablet, Rfl: 6 .  RA VITAMIN D-3 25 MCG (1000 UT) tablet, Take 1 tablet (1,000 Units total) by mouth daily., Disp: 90 tablet, Rfl: 3 .  ranitidine (ZANTAC) 150 MG tablet, Take 150 mg by mouth every morning. , Disp: , Rfl:  .  rosuvastatin (CRESTOR) 10 MG tablet, Take 1 tablet (10 mg total) by mouth daily., Disp: 90 tablet, Rfl: 3   Allergies  Allergen Reactions  . Aspirin Nausea Only    ROS Review of Systems  Constitutional: Negative.   HENT: Negative.   Eyes: Negative.   Respiratory: Negative.   Cardiovascular: Negative.   Musculoskeletal: Negative.   Neurological: Negative.   Psychiatric/Behavioral: Negative.      OBJECTIVE:    Physical Exam  Vitals and nursing note reviewed.  Constitutional:      Appearance: Normal appearance.  HENT:     Head: Normocephalic.     Right Ear: Tympanic membrane normal.     Left Ear: Tympanic membrane normal.     Mouth/Throat:     Mouth: Mucous membranes are moist.  Cardiovascular:     Rate and Rhythm: Normal rate and regular rhythm.  Musculoskeletal:        General: Normal range of motion.     Cervical back: Normal range of motion.  Neurological:     General: No focal deficit present.     Mental Status: He is alert.  Psychiatric:        Mood and Affect: Mood normal.     BP 131/78   Pulse 67   Ht 5' 11.5" (1.816 m)   Wt 196 lb 1.6 oz (89 kg)   BMI 26.97 kg/m  Wt Readings from Last 3 Encounters:  12/12/20 196 lb 1.6 oz (89 kg)  09/15/20 197 lb (89.4  kg)  08/12/20 198 lb 9.6 oz (90.1 kg)    Health Maintenance Due  Topic Date Due  . Hepatitis C Screening  Never done  . PNEUMOCOCCAL POLYSACCHARIDE VACCINE AGE 63-64 HIGH RISK  Never done  . COVID-19 Vaccine (1) Never done  . FOOT EXAM  Never done  . OPHTHALMOLOGY EXAM  Never done  . HIV Screening  Never done  . TETANUS/TDAP  Never done  . INFLUENZA VACCINE  Never done    There are no preventive care reminders to display for this patient.  CBC Latest Ref Rng & Units 05/07/2020 09/13/2017 09/13/2016  WBC 3.8 - 10.8 Thousand/uL 6.3 6.4 6.0  Hemoglobin 13.2 - 17.1 g/dL 38.7 56.4 33.2  Hematocrit 38.5 - 50.0 % 44.7 43.4 44.2  Platelets 140 - 400 Thousand/uL 247 255 272   CMP Latest Ref Rng & Units 05/07/2020 09/13/2017 09/13/2016  Glucose 65 - 99 mg/dL 951(O) 841(Y) 606(T)  BUN 7 - 25 mg/dL 11 15 14   Creatinine 0.70 - 1.33 mg/dL 0.16 0.10  Sodium 135 - 146 mmol/L 140 139 139  Potassium 3.5 - 5.3 mmol/L 4.2 4.1 4.1  Chloride 98 - 110 mmol/L 104 105 106  CO2 20 - 32 mmol/L 21 27 26   Calcium 8.6 - 10.3 mg/dL 9.6 9.6 9.2  Total Protein 6.1 - 8.1 g/dL 7.1 - -  Total Bilirubin 0.2 - 1.2 mg/dL 0.7 - -  AST 10 - 35 U/L 34 - -  ALT 9 - 46 U/L 41 - -    Lab Results  Component Value Date   TSH 2.18 05/07/2020   Lab Results  Component Value Date   ANIONGAP 7 09/13/2017   Lab Results  Component Value Date   CHOL 203 (H) 05/07/2020   CHOL 164 09/13/2016   HDL 34 (L) 05/07/2020   HDL 34 (L) 09/13/2016   LDLCALC 130 (H) 05/07/2020   LDLCALC 99 09/13/2016   CHOLHDL 6.0 (H) 05/07/2020   CHOLHDL 4.8 09/13/2016   Lab Results  Component Value Date   TRIG 240 (H) 05/07/2020   Lab Results  Component Value Date   HGBA1C 6.3 12/12/2020   HGBA1C 5.6 09/15/2020   HGBA1C 6.9 (H) 05/07/2020      ASSESSMENT & PLAN:   Problem List Items Addressed This Visit      Endocrine   Type 2 diabetes mellitus without complication, without long-term current use of insulin (HCC) -  Primary  Patient taking all meds, A1C 6.3 today which is WNL but up from 5.6. Discussed diet and medication compliance, patient feels that his diet was a contributing factor.       Relevant Orders   POCT HgB A1C (Completed)     Other   Mixed dyslipidemia    Patient not sure if he is tolerating the Crestor well, he thinks that he may have had a simple reaction but wants to try it again so he can decrease his MI and CVA risk.          No orders of the defined types were placed in this encounter.     Follow-up: No follow-ups on file.    Irish Lack, FNP [facility] [address], [place], [place] [address]

## 2021-01-12 ENCOUNTER — Other Ambulatory Visit: Payer: Self-pay | Admitting: Internal Medicine

## 2021-02-16 ENCOUNTER — Other Ambulatory Visit: Payer: Self-pay | Admitting: Internal Medicine

## 2021-03-12 ENCOUNTER — Encounter: Payer: Self-pay | Admitting: Family Medicine

## 2021-03-12 ENCOUNTER — Ambulatory Visit (INDEPENDENT_AMBULATORY_CARE_PROVIDER_SITE_OTHER): Payer: 59 | Admitting: Family Medicine

## 2021-03-12 ENCOUNTER — Other Ambulatory Visit: Payer: Self-pay

## 2021-03-12 VITALS — BP 125/80 | HR 68 | Ht 71.5 in | Wt 196.0 lb

## 2021-03-12 DIAGNOSIS — E119 Type 2 diabetes mellitus without complications: Secondary | ICD-10-CM

## 2021-03-12 DIAGNOSIS — E782 Mixed hyperlipidemia: Secondary | ICD-10-CM

## 2021-03-12 LAB — POCT GLYCOSYLATED HEMOGLOBIN (HGB A1C): HbA1c POC (<> result, manual entry): 6.2 % (ref 4.0–5.6)

## 2021-03-12 LAB — GLUCOSE, POCT (MANUAL RESULT ENTRY): POC Glucose: 132 mg/dl — AB (ref 70–99)

## 2021-03-12 NOTE — Assessment & Plan Note (Signed)
Diabetes mellitus Type II, under excellent control.. Discussed general issues about diabetes pathophysiology and management. A1C today 6.2, tolerating metformin and diet well.

## 2021-03-12 NOTE — Progress Notes (Signed)
Established Patient Office Visit  SUBJECTIVE:  Subjective  Patient ID: Eric Viveros., male    DOB: 01/12/1966  Age: 56 y.o. MRN: [id number]  CC:  Chief Complaint  Patient presents with  . Diabetes    HPI Eric Lanni. is a 55 y.o. male presenting today for     Past Medical History:  Diagnosis Date  . Arthritis   . Blood transfusion   . GERD (gastroesophageal reflux disease)   . Hypertension   . Peptic ulcer     Past Surgical History:  Procedure Laterality Date  . BUNIONECTOMY    . CARPAL TUNNEL RELEASE     right  . CERVICAL FUSION    . COLONOSCOPY N/A 03/04/2015   Procedure: COLONOSCOPY;  Surgeon: Kieth Brightly, MD;  Location: [facility];  Service: Endoscopy;  Laterality: N/A;  . HAMMER TOE SURGERY    . POSTERIOR CERVICAL FUSION/FORAMINOTOMY  03/16/2012   Procedure: POSTERIOR CERVICAL FUSION/FORAMINOTOMY LEVEL 1;  Surgeon: Tia Alert, MD;  Location: [facility];  Service: Neurosurgery;  Laterality: N/A;  Cervical six-seven posterior cervical fusion with lateral mass screws    History reviewed. No pertinent family history.  Social History   Socioeconomic History  . Marital status: Married    Spouse name: Not on file  . Number of children: Not on file  . Years of education: Not on file  . Highest education level: Not on file  Occupational History  . Not on file  Tobacco Use  . Smoking status: Former Smoker    Packs/day: 0.40    Years: 18.00    Pack years: 7.20    Types: Cigarettes    Quit date: 01/27/2006    Years since quitting: 15.1  . Smokeless tobacco: Never Used  Substance and Sexual Activity  . Alcohol use: Yes    Comment: occasionally  . Drug use: No  . Sexual activity: Not on file  Other Topics Concern  . Not on file  Social History Narrative  . Not on file   Social Determinants of Health   Financial Resource Strain: Not on file  Food Insecurity: Not on file  Transportation Needs: Not on file  Physical Activity: Not  on file  Stress: Not on file  Social Connections: Not on file  Intimate Partner Violence: Not on file     Current Outpatient Medications:  .  fenofibrate 160 MG tablet, TAKE 1 TABLET BY MOUTH DAILY.., Disp: 30 tablet, Rfl: 6 .  HYDROcodone-acetaminophen (NORCO) 5-325 MG per tablet, Take 1 tablet by mouth every 6 (six) hours as needed. For pain, Disp: , Rfl:  .  metFORMIN (GLUCOPHAGE-XR) 500 MG 24 hr tablet, TAKE 1 TABLET(500 MG) BY MOUTH DAILY WITH BREAKFAST, Disp: 30 tablet, Rfl: 6 .  RA VITAMIN D-3 25 MCG (1000 UT) tablet, Take 1 tablet (1,000 Units total) by mouth daily., Disp: 90 tablet, Rfl: 3 .  ranitidine (ZANTAC) 150 MG tablet, Take 150 mg by mouth every morning. , Disp: , Rfl:  .  rosuvastatin (CRESTOR) 10 MG tablet, Take 1 tablet (10 mg total) by mouth daily., Disp: 90 tablet, Rfl: 3 .  sildenafil (REVATIO) 20 MG tablet, TAKE 1 TABLET BY MOUTH DAILY AS NEEDED, Disp: 30 tablet, Rfl: 6   Allergies  Allergen Reactions  . Aspirin Nausea Only    ROS Review of Systems   OBJECTIVE:    Physical Exam HENT:     Head: Normocephalic and atraumatic.  Cardiovascular:     Rate and  Rhythm: Normal rate and regular rhythm.  Pulmonary:     Effort: Pulmonary effort is normal.     Breath sounds: Normal breath sounds.  Skin:    General: Skin is warm.  Neurological:     Mental Status: He is alert.  Psychiatric:        Mood and Affect: Mood normal.     BP 125/80   Pulse 68   Ht 5' 11.5" (1.816 m)   Wt 196 lb (88.9 kg)   BMI 26.96 kg/m  Wt Readings from Last 3 Encounters:  03/12/21 196 lb (88.9 kg)  12/12/20 196 lb 1.6 oz (89 kg)  09/15/20 197 lb (89.4 kg)    Health Maintenance Due  Topic Date Due  . PNEUMOCOCCAL POLYSACCHARIDE VACCINE AGE 22-64 HIGH RISK  Never done  . COVID-19 Vaccine (1) Never done  . FOOT EXAM  Never done  . OPHTHALMOLOGY EXAM  Never done  . HIV Screening  Never done  . Hepatitis C Screening  Never done  . TETANUS/TDAP  Never done  . Zoster  Vaccines- Shingrix (1 of 2) Never done    There are no preventive care reminders to display for this patient.  CBC Latest Ref Rng & Units 05/07/2020 09/13/2017 09/13/2016  WBC 3.8 - 10.8 Thousand/uL 6.3 6.4 6.0  Hemoglobin 13.2 - 17.1 g/dL 41.2 87.8 67.6  Hematocrit 38.5 - 50.0 % 44.7 43.4 44.2  Platelets 140 - 400 Thousand/uL 247 255 272   CMP Latest Ref Rng & Units 05/07/2020 09/13/2017 09/13/2016  Glucose 65 - 99 mg/dL 720(N) 470(J) 628(Z)  BUN 7 - 25 mg/dL 11 15 14   Creatinine 0.70 - 1.33 mg/dL 6.62 9.47  Sodium 135 - 146 mmol/L 140 139 139  Potassium 3.5 - 5.3 mmol/L 4.2 4.1 4.1  Chloride 98 - 110 mmol/L 104 105 106  CO2 20 - 32 mmol/L 21 27 26   Calcium 8.6 - 10.3 mg/dL 9.6 9.6 9.2  Total Protein 6.1 - 8.1 g/dL 7.1 - -  Total Bilirubin 0.2 - 1.2 mg/dL 0.7 - -  AST 10 - 35 U/L 34 - -  ALT 9 - 46 U/L 41 - -    Lab Results  Component Value Date   TSH 2.18 05/07/2020   Lab Results  Component Value Date   ANIONGAP 7 09/13/2017   Lab Results  Component Value Date   CHOL 203 (H) 05/07/2020   CHOL 164 09/13/2016   HDL 34 (L) 05/07/2020   HDL 34 (L) 09/13/2016   LDLCALC 130 (H) 05/07/2020   LDLCALC 99 09/13/2016   CHOLHDL 6.0 (H) 05/07/2020   CHOLHDL 4.8 09/13/2016   Lab Results  Component Value Date   TRIG 240 (H) 05/07/2020   Lab Results  Component Value Date   HGBA1C 6.2 03/12/2021   HGBA1C 6.3 12/12/2020   HGBA1C 5.6 09/15/2020      ASSESSMENT & PLAN:   Problem List Items Addressed This Visit      Endocrine   Type 2 diabetes mellitus without complication, without long-term current use of insulin (HCC) - Primary    Diabetes mellitus Type II, under excellent control.. Discussed general issues about diabetes pathophysiology and management. A1C today 6.2, tolerating metformin and diet well.        Relevant Orders   POCT glucose (manual entry) (Completed)   POCT HgB A1C (Completed)     Other   Mixed dyslipidemia    Patient taking meds well and  following diet.  No orders of the defined types were placed in this encounter.     Follow-up: No follow-ups on file.    Beckie Salts, [place] [address], [place], [place] [address]

## 2021-03-12 NOTE — Assessment & Plan Note (Signed)
Patient taking meds well and following diet.

## 2021-06-11 ENCOUNTER — Ambulatory Visit: Payer: 59 | Admitting: Family Medicine

## 2021-06-15 LAB — HM DIABETES EYE EXAM

## 2021-06-16 ENCOUNTER — Other Ambulatory Visit: Payer: Self-pay

## 2021-06-16 ENCOUNTER — Ambulatory Visit: Payer: 59 | Admitting: Internal Medicine

## 2021-06-16 ENCOUNTER — Encounter: Payer: Self-pay | Admitting: Internal Medicine

## 2021-06-16 VITALS — BP 130/86 | HR 79 | Ht 71.5 in | Wt 199.6 lb

## 2021-06-16 DIAGNOSIS — E119 Type 2 diabetes mellitus without complications: Secondary | ICD-10-CM | POA: Diagnosis not present

## 2021-06-16 DIAGNOSIS — I10 Essential (primary) hypertension: Secondary | ICD-10-CM

## 2021-06-16 DIAGNOSIS — M1990 Unspecified osteoarthritis, unspecified site: Secondary | ICD-10-CM

## 2021-06-16 DIAGNOSIS — Z716 Tobacco abuse counseling: Secondary | ICD-10-CM

## 2021-06-16 DIAGNOSIS — E782 Mixed hyperlipidemia: Secondary | ICD-10-CM

## 2021-06-16 LAB — GLUCOSE, POCT (MANUAL RESULT ENTRY)
POC Glucose: 144 mg/dl — AB (ref 70–99)
POC Glucose: 144 mg/dl — AB (ref 70–99)

## 2021-06-16 LAB — POCT GLYCOSYLATED HEMOGLOBIN (HGB A1C): HbA1c POC (<> result, manual entry): 5.5 % (ref 4.0–5.6)

## 2021-06-16 NOTE — Assessment & Plan Note (Signed)

## 2021-06-16 NOTE — Assessment & Plan Note (Signed)
Hypercholesterolemia  I advised the patient to follow Mediterranean diet This diet is rich in fruits vegetables and whole grain, and This diet is also rich in fish and lean meat Patient should also eat a handful of almonds or walnuts daily Recent heart study indicated that average follow-up on this kind of diet reduces the cardiovascular mortality by 50 to 70%== 

## 2021-06-16 NOTE — Assessment & Plan Note (Signed)
Patient quit smoking 18 years ago

## 2021-06-16 NOTE — Progress Notes (Signed)
Established Patient Office Visit  Subjective:  Patient ID: Eric Owen., male    DOB: 17-Oct-1966  Age: 55 y.o. MRN: [id number]  CC:  Chief Complaint  Patient presents with   Diabetes    Diabetes   Eric Owen. presents for check up  Past Medical History:  Diagnosis Date   Arthritis    Blood transfusion    GERD (gastroesophageal reflux disease)    Hypertension    Peptic ulcer     Past Surgical History:  Procedure Laterality Date   BUNIONECTOMY     CARPAL TUNNEL RELEASE     right   CERVICAL FUSION     COLONOSCOPY N/A 03/04/2015   Procedure: COLONOSCOPY;  Surgeon: Kieth Brightly, MD;  Location: [facility];  Service: Endoscopy;  Laterality: N/A;   HAMMER TOE SURGERY     POSTERIOR CERVICAL FUSION/FORAMINOTOMY  03/16/2012   Procedure: POSTERIOR CERVICAL FUSION/FORAMINOTOMY LEVEL 1;  Surgeon: Tia Alert, MD;  Location: [facility];  Service: Neurosurgery;  Laterality: N/A;  Cervical six-seven posterior cervical fusion with lateral mass screws    History reviewed. No pertinent family history.  Social History   Socioeconomic History   Marital status: Married    Spouse name: Not on file   Number of children: Not on file   Years of education: Not on file   Highest education level: Not on file  Occupational History   Not on file  Tobacco Use   Smoking status: Former    Packs/day: 0.40    Years: 18.00    Pack years: 7.20    Types: Cigarettes    Quit date: 01/27/2006    Years since quitting: 15.3   Smokeless tobacco: Never  Substance and Sexual Activity   Alcohol use: Yes    Comment: occasionally   Drug use: No   Sexual activity: Not on file  Other Topics Concern   Not on file  Social History Narrative   Not on file   Social Determinants of Health   Financial Resource Strain: Not on file  Food Insecurity: Not on file  Transportation Needs: Not on file  Physical Activity: Not on file  Stress: Not on file  Social Connections: Not on file   Intimate Partner Violence: Not on file     Current Outpatient Medications:    fenofibrate 160 MG tablet, TAKE 1 TABLET BY MOUTH DAILY.., Disp: 30 tablet, Rfl: 6   HYDROcodone-acetaminophen (NORCO) 5-325 MG per tablet, Take 1 tablet by mouth every 6 (six) hours as needed. For pain, Disp: , Rfl:    metFORMIN (GLUCOPHAGE-XR) 500 MG 24 hr tablet, TAKE 1 TABLET(500 MG) BY MOUTH DAILY WITH BREAKFAST, Disp: 30 tablet, Rfl: 6   RA VITAMIN D-3 25 MCG (1000 UT) tablet, Take 1 tablet (1,000 Units total) by mouth daily., Disp: 90 tablet, Rfl: 3   ranitidine (ZANTAC) 150 MG tablet, Take 150 mg by mouth every morning. , Disp: , Rfl:    rosuvastatin (CRESTOR) 10 MG tablet, Take 1 tablet (10 mg total) by mouth daily., Disp: 90 tablet, Rfl: 3   sildenafil (REVATIO) 20 MG tablet, TAKE 1 TABLET BY MOUTH DAILY AS NEEDED, Disp: 30 tablet, Rfl: 6   Allergies  Allergen Reactions   Aspirin Nausea Only    ROS Review of Systems  Constitutional: Negative.   HENT: Negative.    Eyes: Negative.   Respiratory: Negative.    Cardiovascular: Negative.   Gastrointestinal: Negative.   Endocrine: Negative.   Genitourinary: Negative.  Musculoskeletal: Negative.   Skin: Negative.   Allergic/Immunologic: Negative.   Neurological: Negative.   Hematological: Negative.   Psychiatric/Behavioral: Negative.    All other systems reviewed and are negative.    Objective:    Physical Exam Constitutional:      Appearance: Normal appearance.  HENT:     Head: Normocephalic.     Nose: Nose normal.     Mouth/Throat:     Mouth: Mucous membranes are moist.  Eyes:     Pupils: Pupils are equal, round, and reactive to light.  Cardiovascular:     Rate and Rhythm: Normal rate.     Heart sounds: Normal heart sounds.  Pulmonary:     Effort: Pulmonary effort is normal.     Breath sounds: Normal breath sounds.  Abdominal:     General: Abdomen is flat.  Genitourinary:    Prostate: Normal.  Musculoskeletal:         General: Normal range of motion.     Cervical back: Normal range of motion. Tenderness present.  Neurological:     General: No focal deficit present.     Mental Status: He is alert.    BP 130/86   Pulse 79   Ht 5' 11.5" (1.816 m)   Wt 199 lb 9.6 oz (90.5 kg)   BMI 27.45 kg/m  Wt Readings from Last 3 Encounters:  06/16/21 199 lb 9.6 oz (90.5 kg)  03/12/21 196 lb (88.9 kg)  12/12/20 196 lb 1.6 oz (89 kg)     Health Maintenance Due  Topic Date Due   PNEUMOCOCCAL POLYSACCHARIDE VACCINE AGE 15-64 HIGH RISK  Never done   COVID-19 Vaccine (1) Never done   FOOT EXAM  Never done   OPHTHALMOLOGY EXAM  Never done   HIV Screening  Never done   Hepatitis C Screening  Never done   TETANUS/TDAP  Never done   Zoster Vaccines- Shingrix (1 of 2) Never done   URINE MICROALBUMIN  05/13/2021   INFLUENZA VACCINE  05/18/2021    There are no preventive care reminders to display for this patient.  Lab Results  Component Value Date   TSH 2.18 05/07/2020   Lab Results  Component Value Date   WBC 6.3 05/07/2020   HGB 15.1 05/07/2020   HCT 44.7 05/07/2020   MCV 95.1 05/07/2020   PLT 247 05/07/2020   Lab Results  Component Value Date   NA 140 05/07/2020   K 4.2 05/07/2020   CO2 21 05/07/2020   GLUCOSE 134 (H) 05/07/2020   BUN 11 05/07/2020   CREATININE 1.08 05/07/2020   BILITOT 0.7 05/07/2020   AST 34 05/07/2020   ALT 41 05/07/2020   PROT 7.1 05/07/2020   CALCIUM 9.6 05/07/2020   ANIONGAP 7 09/13/2017   Lab Results  Component Value Date   CHOL 203 (H) 05/07/2020   Lab Results  Component Value Date   HDL 34 (L) 05/07/2020   Lab Results  Component Value Date   LDLCALC 130 (H) 05/07/2020   Lab Results  Component Value Date   TRIG 240 (H) 05/07/2020   Lab Results  Component Value Date   CHOLHDL 6.0 (H) 05/07/2020   Lab Results  Component Value Date   HGBA1C 5.5 06/16/2021      Assessment & Plan:   Problem List Items Addressed This Visit       Cardiovascular  and Mediastinum   Essential hypertension     Patient denies any chest pain or shortness of breath there is  no history of palpitation or paroxysmal nocturnal dyspnea   patient was advised to follow low-salt low-cholesterol diet    ideally I want to keep systolic blood pressure below 259 mmHg, patient was asked to check blood pressure one times a week and give me a report on that.  Patient will be follow-up in 3 months  or earlier as needed, patient will call me back for any change in the cardiovascular symptoms Patient was advised to buy a book from local bookstore concerning blood pressure and read several chapters  every day.  This will be supplemented by some of the material we will give him from the office.  Patient should also utilize other resources like YouTube and Internet to learn more about the blood pressure and the diet.        Endocrine   Type 2 diabetes mellitus without complication, without long-term current use of insulin (HCC) - Primary    - The patient's blood sugar is labile on med. - The patient will continue the current treatment regimen.  - I encouraged the patient to regularly check blood sugar.  - I encouraged the patient to monitor diet. I encouraged the patient to eat low-carb and low-sugar to help prevent blood sugar spikes.  - I encouraged the patient to continue following their prescribed treatment plan for diabetes - I informed the patient to get help if blood sugar drops below 54mg /dL, or if suddenly have trouble thinking clearly or breathing.  Patient was advised to buy a book on diabetes from a local bookstore or from .  Patient should read 2 chapters every day to keep the motivation going, this is in addition to some of the materials we provided them from the office.  There are other resources on the Internet like YouTube and wilkipedia to get an education on the diabetes hemoglobin AIC is 5.5 blood sugar is 144      Relevant Orders   POCT glucose (manual  entry) (Completed)   POCT glucose (manual entry) (Completed)   Microalbumin, urine   POCT HgB A1C (Completed)     Musculoskeletal and Integument   Arthritis    Patient take vitamin D for the arthritis        Other   Tobacco abuse counseling    Patient quit smoking 18 years ago      Mixed dyslipidemia    Hypercholesterolemia  I advised the patient to follow Mediterranean diet This diet is rich in fruits vegetables and whole grain, and This diet is also rich in fish and lean meat Patient should also eat a handful of almonds or walnuts daily Recent heart study indicated that average follow-up on this kind of diet reduces the cardiovascular mortality by 50 to 70%==       No orders of the defined types were placed in this encounter.   Follow-up: No follow-ups on file.    [place], MD

## 2021-06-16 NOTE — Assessment & Plan Note (Signed)
-   The patient's blood sugar is labile on med. - The patient will continue the current treatment regimen.  - I encouraged the patient to regularly check blood sugar.  - I encouraged the patient to monitor diet. I encouraged the patient to eat low-carb and low-sugar to help prevent blood sugar spikes.  - I encouraged the patient to continue following their prescribed treatment plan for diabetes - I informed the patient to get help if blood sugar drops below 54mg /dL, or if suddenly have trouble thinking clearly or breathing.  Patient was advised to buy a book on diabetes from a local bookstore or from .  Patient should read 2 chapters every day to keep the motivation going, this is in addition to some of the materials we provided them from the office.  There are other resources on the Internet like YouTube and wilkipedia to get an education on the diabetes hemoglobin AIC is 5.5 blood sugar is 144

## 2021-06-16 NOTE — Assessment & Plan Note (Signed)
Patient take vitamin D for the arthritis

## 2021-06-17 LAB — MICROALBUMIN, URINE: Microalb, Ur: <0.2 mg/dL

## 2021-09-16 ENCOUNTER — Ambulatory Visit: Payer: 59 | Admitting: Internal Medicine

## 2021-09-23 ENCOUNTER — Encounter: Payer: Self-pay | Admitting: Internal Medicine

## 2021-09-23 ENCOUNTER — Other Ambulatory Visit: Payer: Self-pay

## 2021-09-23 ENCOUNTER — Ambulatory Visit: Payer: 59 | Admitting: Internal Medicine

## 2021-09-23 VITALS — BP 136/79 | HR 74 | Ht 71.5 in | Wt 199.0 lb

## 2021-09-23 DIAGNOSIS — E119 Type 2 diabetes mellitus without complications: Secondary | ICD-10-CM | POA: Diagnosis not present

## 2021-09-23 DIAGNOSIS — M1990 Unspecified osteoarthritis, unspecified site: Secondary | ICD-10-CM | POA: Diagnosis not present

## 2021-09-23 DIAGNOSIS — K219 Gastro-esophageal reflux disease without esophagitis: Secondary | ICD-10-CM | POA: Diagnosis not present

## 2021-09-23 DIAGNOSIS — I10 Essential (primary) hypertension: Secondary | ICD-10-CM | POA: Diagnosis not present

## 2021-09-23 DIAGNOSIS — E782 Mixed hyperlipidemia: Secondary | ICD-10-CM

## 2021-09-23 LAB — GLUCOSE, POCT (MANUAL RESULT ENTRY): POC Glucose: 216 mg/dl — AB (ref 70–99)

## 2021-09-23 MED ORDER — LOSARTAN POTASSIUM 25 MG PO TABS: 25.0000 mg | ORAL_TABLET | Freq: Every day | ORAL | 3 refills | Status: DC

## 2021-09-23 NOTE — Assessment & Plan Note (Signed)
-   The patient's GERD is stable on medication.  - Instructed the patient to avoid eating spicy and acidic foods, as well as foods high in fat. - Instructed the patient to avoid eating large meals or meals 2-3 hours prior to sleeping. 

## 2021-09-23 NOTE — Assessment & Plan Note (Signed)
Blood sugar is labile 

## 2021-09-23 NOTE — Assessment & Plan Note (Signed)
Hypercholesterolemia  I advised the patient to follow Mediterranean diet This diet is rich in fruits vegetables and whole grain, and This diet is also rich in fish and lean meat Patient should also eat a handful of almonds or walnuts daily Recent heart study indicated that average follow-up on this kind of diet reduces the cardiovascular mortality by 50 to 70%== 

## 2021-09-23 NOTE — Assessment & Plan Note (Signed)
Chronic problem. 

## 2021-09-23 NOTE — Assessment & Plan Note (Signed)

## 2021-09-23 NOTE — Progress Notes (Signed)
Established Patient Office Visit  Subjective:  Patient ID: Eric Owen., male    DOB: Dec 22, 1965  Age: 55 y.o. MRN: [id number]  CC:  Chief Complaint  Patient presents with   Diabetes    3 month follow up    Diabetes   Eric Owen. presents for check up  Past Medical History:  Diagnosis Date   Arthritis    Blood transfusion    GERD (gastroesophageal reflux disease)    Hypertension    Peptic ulcer     Past Surgical History:  Procedure Laterality Date   BUNIONECTOMY     CARPAL TUNNEL RELEASE     right   CERVICAL FUSION     COLONOSCOPY N/A 03/04/2015   Procedure: COLONOSCOPY;  Surgeon: Kieth Brightly, MD;  Location: [facility];  Service: Endoscopy;  Laterality: N/A;   HAMMER TOE SURGERY     POSTERIOR CERVICAL FUSION/FORAMINOTOMY  03/16/2012   Procedure: POSTERIOR CERVICAL FUSION/FORAMINOTOMY LEVEL 1;  Surgeon: Tia Alert, MD;  Location: [facility];  Service: Neurosurgery;  Laterality: N/A;  Cervical six-seven posterior cervical fusion with lateral mass screws    History reviewed. No pertinent family history.  Social History   Socioeconomic History   Marital status: Married    Spouse name: Not on file   Number of children: Not on file   Years of education: Not on file   Highest education level: Not on file  Occupational History   Not on file  Tobacco Use   Smoking status: Former    Packs/day: 0.40    Years: 18.00    Pack years: 7.20    Types: Cigarettes    Quit date: 01/27/2006    Years since quitting: 15.6   Smokeless tobacco: Never  Substance and Sexual Activity   Alcohol use: Yes    Comment: occasionally   Drug use: No   Sexual activity: Not on file  Other Topics Concern   Not on file  Social History Narrative   Not on file   Social Determinants of Health   Financial Resource Strain: Not on file  Food Insecurity: Not on file  Transportation Needs: Not on file  Physical Activity: Not on file  Stress: Not on file  Social  Connections: Not on file  Intimate Partner Violence: Not on file     Current Outpatient Medications:    losartan (COZAAR) 25 MG tablet, Take 1 tablet (25 mg total) by mouth daily., Disp: 90 tablet, Rfl: 3   fenofibrate 160 MG tablet, TAKE 1 TABLET BY MOUTH DAILY.., Disp: 30 tablet, Rfl: 6   HYDROcodone-acetaminophen (NORCO) 5-325 MG per tablet, Take 1 tablet by mouth every 6 (six) hours as needed. For pain, Disp: , Rfl:    metFORMIN (GLUCOPHAGE-XR) 500 MG 24 hr tablet, TAKE 1 TABLET(500 MG) BY MOUTH DAILY WITH BREAKFAST, Disp: 30 tablet, Rfl: 6   RA VITAMIN D-3 25 MCG (1000 UT) tablet, Take 1 tablet (1,000 Units total) by mouth daily., Disp: 90 tablet, Rfl: 3   ranitidine (ZANTAC) 150 MG tablet, Take 150 mg by mouth every morning. , Disp: , Rfl:    rosuvastatin (CRESTOR) 10 MG tablet, Take 1 tablet (10 mg total) by mouth daily., Disp: 90 tablet, Rfl: 3   sildenafil (REVATIO) 20 MG tablet, TAKE 1 TABLET BY MOUTH DAILY AS NEEDED, Disp: 30 tablet, Rfl: 6   Allergies  Allergen Reactions   Aspirin Nausea Only    ROS Review of Systems  Constitutional: Negative.   HENT:  Negative.    Eyes: Negative.   Respiratory: Negative.    Cardiovascular: Negative.   Gastrointestinal: Negative.   Endocrine: Negative.   Genitourinary: Negative.   Musculoskeletal: Negative.   Skin: Negative.   Allergic/Immunologic: Negative.   Neurological: Negative.   Hematological: Negative.   Psychiatric/Behavioral: Negative.    All other systems reviewed and are negative.    Objective:    Physical Exam Vitals reviewed.  Constitutional:      Appearance: Normal appearance.  HENT:     Mouth/Throat:     Mouth: Mucous membranes are moist.  Eyes:     Pupils: Pupils are equal, round, and reactive to light.  Neck:     Vascular: No carotid bruit.  Cardiovascular:     Rate and Rhythm: Normal rate and regular rhythm.     Pulses: Normal pulses.     Heart sounds: Normal heart sounds.  Pulmonary:     Effort:  Pulmonary effort is normal.     Breath sounds: Normal breath sounds.  Abdominal:     General: Bowel sounds are normal.     Palpations: Abdomen is soft. There is no hepatomegaly, splenomegaly or mass.     Tenderness: There is no abdominal tenderness.     Hernia: No hernia is present.  Musculoskeletal:     Cervical back: Neck supple.     Right lower leg: No edema.     Left lower leg: No edema.  Skin:    Findings: No rash.  Neurological:     Mental Status: He is alert and oriented to person, place, and time.     Motor: No weakness.  Psychiatric:        Mood and Affect: Mood normal.        Behavior: Behavior normal.    BP 136/79   Pulse 74   Ht 5' 11.5" (1.816 m)   Wt 199 lb (90.3 kg)   BMI 27.37 kg/m  Wt Readings from Last 3 Encounters:  09/23/21 199 lb (90.3 kg)  06/16/21 199 lb 9.6 oz (90.5 kg)  03/12/21 196 lb (88.9 kg)     Health Maintenance Due  Topic Date Due   COVID-19 Vaccine (1) Never done   Pneumococcal Vaccine 53-8 Years old (1 - PCV) Never done   FOOT EXAM  Never done   HIV Screening  Never done   Hepatitis C Screening  Never done   TETANUS/TDAP  Never done   Zoster Vaccines- Shingrix (1 of 2) Never done   INFLUENZA VACCINE  Never done    There are no preventive care reminders to display for this patient.  Lab Results  Component Value Date   TSH 2.18 05/07/2020   Lab Results  Component Value Date   WBC 6.3 05/07/2020   HGB 15.1 05/07/2020   HCT 44.7 05/07/2020   MCV 95.1 05/07/2020   PLT 247 05/07/2020   Lab Results  Component Value Date   NA 140 05/07/2020   K 4.2 05/07/2020   CO2 21 05/07/2020   GLUCOSE 134 (H) 05/07/2020   BUN 11 05/07/2020   CREATININE 1.08 05/07/2020   BILITOT 0.7 05/07/2020   AST 34 05/07/2020   ALT 41 05/07/2020   PROT 7.1 05/07/2020   CALCIUM 9.6 05/07/2020   ANIONGAP 7 09/13/2017   Lab Results  Component Value Date   CHOL 203 (H) 05/07/2020   Lab Results  Component Value Date   HDL 34 (L) 05/07/2020    Lab Results  Component Value Date  LDLCALC 130 (H) 05/07/2020   Lab Results  Component Value Date   TRIG 240 (H) 05/07/2020   Lab Results  Component Value Date   CHOLHDL 6.0 (H) 05/07/2020   Lab Results  Component Value Date   HGBA1C 5.5 06/16/2021      Assessment & Plan:   Problem List Items Addressed This Visit       Cardiovascular and Mediastinum   Essential hypertension     Patient denies any chest pain or shortness of breath there is no history of palpitation or paroxysmal nocturnal dyspnea   patient was advised to follow low-salt low-cholesterol diet    ideally I want to keep systolic blood pressure below 893 mmHg, patient was asked to check blood pressure one times a week and give me a report on that.  Patient will be follow-up in 3 months  or earlier as needed, patient will call me back for any change in the cardiovascular symptoms Patient was advised to buy a book from local bookstore concerning blood pressure and read several chapters  every day.  This will be supplemented by some of the material we will give him from the office.  Patient should also utilize other resources like YouTube and Internet to learn more about the blood pressure and the diet.      Relevant Medications   losartan (COZAAR) 25 MG tablet     Digestive   GERD (gastroesophageal reflux disease)    - The patient's GERD is stable on medication.  - Instructed the patient to avoid eating spicy and acidic foods, as well as foods high in fat. - Instructed the patient to avoid eating large meals or meals 2-3 hours prior to sleeping.        Endocrine   Type 2 diabetes mellitus without complication, without long-term current use of insulin (HCC) - Primary    Blood sugar is labile      Relevant Medications   losartan (COZAAR) 25 MG tablet   Other Relevant Orders   POCT glucose (manual entry) (Completed)     Musculoskeletal and Integument   Arthritis    Chronic problem        Other    Mixed dyslipidemia    Hypercholesterolemia  I advised the patient to follow Mediterranean diet This diet is rich in fruits vegetables and whole grain, and This diet is also rich in fish and lean meat Patient should also eat a handful of almonds or walnuts daily Recent heart study indicated that average follow-up on this kind of diet reduces the cardiovascular mortality by 50 to 70%==       Meds ordered this encounter  Medications   losartan (COZAAR) 25 MG tablet    Sig: Take 1 tablet (25 mg total) by mouth daily.    Dispense:  90 tablet    Refill:  3    Follow-up: No follow-ups on file.    Corky Downs, MD

## 2021-09-24 ENCOUNTER — Other Ambulatory Visit: Payer: Self-pay | Admitting: Internal Medicine

## 2021-09-25 ENCOUNTER — Other Ambulatory Visit: Payer: Self-pay | Admitting: Internal Medicine

## 2021-11-24 ENCOUNTER — Other Ambulatory Visit: Payer: Self-pay

## 2021-11-24 ENCOUNTER — Other Ambulatory Visit: Payer: 59 | Admitting: Internal Medicine

## 2021-11-24 DIAGNOSIS — K219 Gastro-esophageal reflux disease without esophagitis: Secondary | ICD-10-CM

## 2021-11-24 DIAGNOSIS — I10 Essential (primary) hypertension: Secondary | ICD-10-CM

## 2021-11-24 DIAGNOSIS — E782 Mixed hyperlipidemia: Secondary | ICD-10-CM

## 2021-11-24 DIAGNOSIS — Z716 Tobacco abuse counseling: Secondary | ICD-10-CM

## 2021-11-24 NOTE — Assessment & Plan Note (Signed)
-   I instructed the patient to stop smoking and provided them with smoking cessation materials.  - I informed the patient that smoking puts them at increased risk for cancer, COPD, hypertension, and more.  - Informed the patient to seek help if they begin to have trouble breathing, develop chest pain, start to cough up blood, feel faint, or pass out.  

## 2021-11-24 NOTE — Assessment & Plan Note (Signed)

## 2021-11-24 NOTE — Assessment & Plan Note (Signed)
Hypercholesterolemia  I advised the patient to follow Mediterranean diet This diet is rich in fruits vegetables and whole grain, and This diet is also rich in fish and lean meat Patient should also eat a handful of almonds or walnuts daily Recent heart study indicated that average follow-up on this kind of diet reduces the cardiovascular mortality by 50 to 70%== 

## 2021-11-24 NOTE — Assessment & Plan Note (Signed)
-   The patient's GERD is stable on medication.  - Instructed the patient to avoid eating spicy and acidic foods, as well as foods high in fat. - Instructed the patient to avoid eating large meals or meals 2-3 hours prior to sleeping. 

## 2021-11-24 NOTE — Progress Notes (Unsigned)
Established Patient Office Visit  Subjective:  Patient ID: Eric Basch., male    DOB: Oct 04, 1966  Age: 56 y.o. MRN: [id number]  CC: No chief complaint on file.   HPI  Eric Owen. presents for echocardiogram  Past Medical History:  Diagnosis Date   Arthritis    Blood transfusion    GERD (gastroesophageal reflux disease)    Hypertension    Peptic ulcer     Past Surgical History:  Procedure Laterality Date   BUNIONECTOMY     CARPAL TUNNEL RELEASE     right   CERVICAL FUSION     COLONOSCOPY N/A 03/04/2015   Procedure: COLONOSCOPY;  Surgeon: Kieth Brightly, MD;  Location: [facility];  Service: Endoscopy;  Laterality: N/A;   HAMMER TOE SURGERY     POSTERIOR CERVICAL FUSION/FORAMINOTOMY  03/16/2012   Procedure: POSTERIOR CERVICAL FUSION/FORAMINOTOMY LEVEL 1;  Surgeon: Tia Alert, MD;  Location: [facility];  Service: Neurosurgery;  Laterality: N/A;  Cervical six-seven posterior cervical fusion with lateral mass screws    History reviewed. No pertinent family history.  Social History   Socioeconomic History   Marital status: Married    Spouse name: Not on file   Number of children: Not on file   Years of education: Not on file   Highest education level: Not on file  Occupational History   Not on file  Tobacco Use   Smoking status: Former    Packs/day: 0.40    Years: 18.00    Pack years: 7.20    Types: Cigarettes    Quit date: 01/27/2006    Years since quitting: 15.8   Smokeless tobacco: Never  Substance and Sexual Activity   Alcohol use: Yes    Comment: occasionally   Drug use: No   Sexual activity: Not on file  Other Topics Concern   Not on file  Social History Narrative   Not on file   Social Determinants of Health   Financial Resource Strain: Not on file  Food Insecurity: Not on file  Transportation Needs: Not on file  Physical Activity: Not on file  Stress: Not on file  Social Connections: Not on file  Intimate Partner  Violence: Not on file     Current Outpatient Medications:    cholecalciferol (VITAMIN D) 25 MCG (1000 UNIT) tablet, TAKE 1 TABLET BY MOUTH DAILY, Disp: 90 tablet, Rfl: 3   fenofibrate 160 MG tablet, TAKE 1 TABLET BY MOUTH DAILY, Disp: 30 tablet, Rfl: 6   HYDROcodone-acetaminophen (NORCO) 5-325 MG per tablet, Take 1 tablet by mouth every 6 (six) hours as needed. For pain, Disp: , Rfl:    losartan (COZAAR) 25 MG tablet, Take 1 tablet (25 mg total) by mouth daily., Disp: 90 tablet, Rfl: 3   metFORMIN (GLUCOPHAGE-XR) 500 MG 24 hr tablet, TAKE 1 TABLET(500 MG) BY MOUTH DAILY WITH BREAKFAST, Disp: 30 tablet, Rfl: 6   ranitidine (ZANTAC) 150 MG tablet, Take 150 mg by mouth every morning. , Disp: , Rfl:    rosuvastatin (CRESTOR) 10 MG tablet, Take 1 tablet (10 mg total) by mouth daily., Disp: 90 tablet, Rfl: 3   sildenafil (REVATIO) 20 MG tablet, TAKE 1 TABLET BY MOUTH DAILY AS NEEDED, Disp: 30 tablet, Rfl: 6   Allergies  Allergen Reactions   Aspirin Nausea Only    ROS Review of Systems  Constitutional: Negative.   HENT: Negative.    Eyes: Negative.   Respiratory: Negative.    Cardiovascular: Negative.   Gastrointestinal:  Negative.   Endocrine: Negative.   Genitourinary: Negative.   Musculoskeletal: Negative.   Skin: Negative.   Allergic/Immunologic: Negative.   Neurological: Negative.   Hematological: Negative.   Psychiatric/Behavioral: Negative.    All other systems reviewed and are negative.    Objective:    Physical Exam Vitals reviewed.  Constitutional:      Appearance: Normal appearance.  HENT:     Mouth/Throat:     Mouth: Mucous membranes are moist.  Eyes:     Pupils: Pupils are equal, round, and reactive to light.  Neck:     Vascular: No carotid bruit.  Cardiovascular:     Rate and Rhythm: Normal rate and regular rhythm.     Pulses: Normal pulses.     Heart sounds: Murmur heard.  Pulmonary:     Effort: Pulmonary effort is normal.     Breath sounds: Normal  breath sounds.  Abdominal:     General: Bowel sounds are normal.     Palpations: Abdomen is soft. There is no hepatomegaly, splenomegaly or mass.     Tenderness: There is no abdominal tenderness.     Hernia: No hernia is present.  Musculoskeletal:     Cervical back: Neck supple.     Right lower leg: No edema.     Left lower leg: No edema.  Skin:    Findings: No rash.  Neurological:     Mental Status: He is alert and oriented to person, place, and time.     Motor: No weakness.  Psychiatric:        Mood and Affect: Mood normal.        Behavior: Behavior normal.    There were no vitals taken for this visit. Wt Readings from Last 3 Encounters:  09/23/21 199 lb (90.3 kg)  06/16/21 199 lb 9.6 oz (90.5 kg)  03/12/21 196 lb (88.9 kg)     Health Maintenance Due  Topic Date Due   COVID-19 Vaccine (1) Never done   FOOT EXAM  Never done   HIV Screening  Never done   Hepatitis C Screening  Never done   TETANUS/TDAP  Never done   Zoster Vaccines- Shingrix (1 of 2) Never done   INFLUENZA VACCINE  Never done    There are no preventive care reminders to display for this patient.  Lab Results  Component Value Date   TSH 2.18 05/07/2020   Lab Results  Component Value Date   WBC 6.3 05/07/2020   HGB 15.1 05/07/2020   HCT 44.7 05/07/2020   MCV 95.1 05/07/2020   PLT 247 05/07/2020   Lab Results  Component Value Date   NA 140 05/07/2020   K 4.2 05/07/2020   CO2 21 05/07/2020   GLUCOSE 134 (H) 05/07/2020   BUN 11 05/07/2020   CREATININE 1.08 05/07/2020   BILITOT 0.7 05/07/2020   AST 34 05/07/2020   ALT 41 05/07/2020   PROT 7.1 05/07/2020   CALCIUM 9.6 05/07/2020   ANIONGAP 7 09/13/2017   Lab Results  Component Value Date   CHOL 203 (H) 05/07/2020   Lab Results  Component Value Date   HDL 34 (L) 05/07/2020   Lab Results  Component Value Date   LDLCALC 130 (H) 05/07/2020   Lab Results  Component Value Date   TRIG 240 (H) 05/07/2020   Lab Results  Component  Value Date   CHOLHDL 6.0 (H) 05/07/2020   Lab Results  Component Value Date   HGBA1C 5.5 06/16/2021  Assessment & Plan:   Problem List Items Addressed This Visit       Cardiovascular and Mediastinum   Essential hypertension - Primary     Patient denies any chest pain or shortness of breath there is no history of palpitation or paroxysmal nocturnal dyspnea   patient was advised to follow low-salt low-cholesterol diet    ideally I want to keep systolic blood pressure below 742 mmHg, patient was asked to check blood pressure one times a week and give me a report on that.  Patient will be follow-up in 3 months  or earlier as needed, patient will call me back for any change in the cardiovascular symptoms Patient was advised to buy a book from local bookstore concerning blood pressure and read several chapters  every day.  This will be supplemented by some of the material we will give him from the office.  Patient should also utilize other resources like YouTube and Internet to learn more about the blood pressure and the diet.        Digestive   GERD (gastroesophageal reflux disease)    - The patient's GERD is stable on medication.  - Instructed the patient to avoid eating spicy and acidic foods, as well as foods high in fat. - Instructed the patient to avoid eating large meals or meals 2-3 hours prior to sleeping.        Other   Tobacco abuse counseling    - I instructed the patient to stop smoking and provided them with smoking cessation materials.  - I informed the patient that smoking puts them at increased risk for cancer, COPD, hypertension, and more.  - Informed the patient to seek help if they begin to have trouble breathing, develop chest pain, start to cough up blood, feel faint, or pass out.      Mixed dyslipidemia    Hypercholesterolemia  I advised the patient to follow Mediterranean diet This diet is rich in fruits vegetables and whole grain, and This diet is  also rich in fish and lean meat Patient should also eat a handful of almonds or walnuts daily Recent heart study indicated that average follow-up on this kind of diet reduces the cardiovascular mortality by 50 to 70%==      [facility] [address] [place], [place] [address] Phone: [phone] Fax:  [phone]  Transthoracic Echocardiogram Note  Drayce Tawil [id number] 1966-01-11  Procedure: Transthoracic Echocardiogram Indications: Systolic murmur Verbal Consent: Obtained  Procedure Details   Technical quality: good  Resting Measurements: Normal limits  Left Ventrical: Within normal limits  Mitral Valve: Normal mitral valve  Aortic Valve: Aortic valve is minimally thickened mild AI was noted  Tricuspid Valve: Within normal limits  Pulmonic Valve: Not seen  Left Atrium/ Left atrial appendage: No blood clots  Atrial septum:   Aorta: Aortic root is within normal limits   Complications: No apparent complications Patient did not tolerate procedure well.  Corky Downs, MD  No orders of the defined types were placed in this encounter.   Follow-up: No follow-ups on file.    Corky Downs, MD

## 2022-02-10 ENCOUNTER — Other Ambulatory Visit: Payer: Self-pay | Admitting: Internal Medicine

## 2022-06-07 ENCOUNTER — Other Ambulatory Visit: Payer: Self-pay | Admitting: *Deleted

## 2022-06-07 MED ORDER — VITAMIN D3 25 MCG (1000 UNIT) PO TABS: 1000.0000 [IU] | ORAL_TABLET | Freq: Every day | ORAL | 3 refills | Status: AC

## 2022-06-07 MED ORDER — METFORMIN HCL ER 500 MG PO TB24: ORAL_TABLET | ORAL | 3 refills | Status: DC

## 2022-06-07 MED ORDER — FENOFIBRATE 160 MG PO TABS: 160.0000 mg | ORAL_TABLET | Freq: Every day | ORAL | 3 refills | Status: DC

## 2022-07-26 ENCOUNTER — Ambulatory Visit (INDEPENDENT_AMBULATORY_CARE_PROVIDER_SITE_OTHER): Payer: 59 | Admitting: Internal Medicine

## 2022-07-26 ENCOUNTER — Encounter: Payer: Self-pay | Admitting: Internal Medicine

## 2022-07-26 VITALS — BP 135/84 | HR 82 | Ht 71.5 in | Wt 194.9 lb

## 2022-07-26 DIAGNOSIS — I10 Essential (primary) hypertension: Secondary | ICD-10-CM | POA: Diagnosis not present

## 2022-07-26 DIAGNOSIS — Z716 Tobacco abuse counseling: Secondary | ICD-10-CM | POA: Diagnosis not present

## 2022-07-26 DIAGNOSIS — T148XXA Other injury of unspecified body region, initial encounter: Secondary | ICD-10-CM

## 2022-07-26 DIAGNOSIS — E119 Type 2 diabetes mellitus without complications: Secondary | ICD-10-CM

## 2022-07-26 DIAGNOSIS — M1990 Unspecified osteoarthritis, unspecified site: Secondary | ICD-10-CM

## 2022-07-26 DIAGNOSIS — K219 Gastro-esophageal reflux disease without esophagitis: Secondary | ICD-10-CM | POA: Diagnosis not present

## 2022-07-26 LAB — POCT GLYCOSYLATED HEMOGLOBIN (HGB A1C): HbA1c POC (<> result, manual entry): 5.9 % (ref 4.0–5.6)

## 2022-07-26 LAB — GLUCOSE, POCT (MANUAL RESULT ENTRY): POC Glucose: 153 mg/dl — AB (ref 70–99)

## 2022-07-26 NOTE — Progress Notes (Signed)
Established Patient Office Visit  Subjective:  Patient ID: Eric Heckendorn., male    DOB: 06-01-1966  Age: 56 y.o. MRN: [id number]  CC:  Chief Complaint  Patient presents with   Diabetes   Arthritis    Patient states that his arthritis has worsened over the past few months.     Diabetes Pertinent negatives for hypoglycemia include no dizziness, pallor or speech difficulty. Pertinent negatives for diabetes include no chest pain.  Arthritis Pertinent negatives include no rash.    Eric Quaker. presents for check up  Past Medical History:  Diagnosis Date   Arthritis    Blood transfusion    GERD (gastroesophageal reflux disease)    Hypertension    Peptic ulcer     Past Surgical History:  Procedure Laterality Date   BUNIONECTOMY     CARPAL TUNNEL RELEASE     right   CERVICAL FUSION     COLONOSCOPY N/A 03/04/2015   Procedure: COLONOSCOPY;  Surgeon: Kieth Brightly, MD;  Location: [facility];  Service: Endoscopy;  Laterality: N/A;   HAMMER TOE SURGERY     POSTERIOR CERVICAL FUSION/FORAMINOTOMY  03/16/2012   Procedure: POSTERIOR CERVICAL FUSION/FORAMINOTOMY LEVEL 1;  Surgeon: Tia Alert, MD;  Location: [facility];  Service: Neurosurgery;  Laterality: N/A;  Cervical six-seven posterior cervical fusion with lateral mass screws    History reviewed. No pertinent family history.  Social History   Socioeconomic History   Marital status: Married    Spouse name: Not on file   Number of children: Not on file   Years of education: Not on file   Highest education level: Not on file  Occupational History   Not on file  Tobacco Use   Smoking status: Former    Packs/day: 0.40    Years: 18.00    Total pack years: 7.20    Types: Cigarettes    Quit date: 01/27/2006    Years since quitting: 16.5   Smokeless tobacco: Never  Substance and Sexual Activity   Alcohol use: Yes    Comment: occasionally   Drug use: No   Sexual activity: Not on file  Other Topics  Concern   Not on file  Social History Narrative   Not on file   Social Determinants of Health   Financial Resource Strain: Not on file  Food Insecurity: Not on file  Transportation Needs: Not on file  Physical Activity: Not on file  Stress: Not on file  Social Connections: Not on file  Intimate Partner Violence: Not on file     Current Outpatient Medications:    cholecalciferol (VITAMIN D3) 25 MCG (1000 UNIT) tablet, Take 1 tablet (1,000 Units total) by mouth daily., Disp: 90 tablet, Rfl: 3   fenofibrate 160 MG tablet, Take 1 tablet (160 mg total) by mouth daily., Disp: 90 tablet, Rfl: 3   HYDROcodone-acetaminophen (NORCO) 5-325 MG per tablet, Take 1 tablet by mouth every 6 (six) hours as needed. For pain, Disp: , Rfl:    losartan (COZAAR) 25 MG tablet, Take 1 tablet (25 mg total) by mouth daily., Disp: 90 tablet, Rfl: 3   metFORMIN (GLUCOPHAGE-XR) 500 MG 24 hr tablet, TAKE 1 TABLET(500 MG) BY MOUTH DAILY WITH BREAKFAST, Disp: 90 tablet, Rfl: 3   ranitidine (ZANTAC) 150 MG tablet, Take 150 mg by mouth every morning. , Disp: , Rfl:    rosuvastatin (CRESTOR) 10 MG tablet, Take 1 tablet (10 mg total) by mouth daily., Disp: 90 tablet, Rfl: 3  sildenafil (REVATIO) 20 MG tablet, TAKE 1 TABLET BY MOUTH DAILY AS NEEDED, Disp: 30 tablet, Rfl: 6   Allergies  Allergen Reactions   Aspirin Nausea Only    ROS Review of Systems  Constitutional: Negative.   HENT: Negative.  Negative for dental problem, ear discharge and facial swelling.   Eyes: Negative.   Respiratory: Negative.    Cardiovascular: Negative.  Negative for chest pain and leg swelling.  Gastrointestinal: Negative.   Endocrine: Negative.   Genitourinary: Negative.   Musculoskeletal:  Positive for arthralgias, arthritis, myalgias, neck pain and neck stiffness.  Skin: Negative.  Negative for pallor, rash and wound.  Allergic/Immunologic: Negative.   Neurological: Negative.  Negative for dizziness and speech difficulty.   Hematological: Negative.   Psychiatric/Behavioral: Negative.    All other systems reviewed and are negative.     Objective:    Physical Exam Vitals reviewed.  Constitutional:      Appearance: Normal appearance.  HENT:     Mouth/Throat:     Mouth: Mucous membranes are moist.  Eyes:     Pupils: Pupils are equal, round, and reactive to light.  Neck:     Vascular: No carotid bruit.     Comments: Neck surgery in past Cardiovascular:     Rate and Rhythm: Normal rate and regular rhythm.     Pulses: Normal pulses.     Heart sounds: Normal heart sounds.  Pulmonary:     Effort: Pulmonary effort is normal.     Breath sounds: Normal breath sounds.  Abdominal:     General: Bowel sounds are normal.     Palpations: Abdomen is soft. There is no hepatomegaly, splenomegaly or mass.     Tenderness: There is no abdominal tenderness.     Hernia: No hernia is present.  Musculoskeletal:     Cervical back: Neck supple.     Right lower leg: No edema.     Left lower leg: No edema.  Lymphadenopathy:     Cervical: No cervical adenopathy.  Skin:    Findings: No rash.  Neurological:     Mental Status: He is alert and oriented to person, place, and time.     Motor: No weakness.  Psychiatric:        Mood and Affect: Mood normal.        Behavior: Behavior normal.     BP 135/84   Pulse 82   Ht 5' 11.5" (1.816 m)   Wt 194 lb 14.4 oz (88.4 kg)   BMI 26.80 kg/m  Wt Readings from Last 3 Encounters:  07/26/22 194 lb 14.4 oz (88.4 kg)  09/23/21 199 lb (90.3 kg)  06/16/21 199 lb 9.6 oz (90.5 kg)     Health Maintenance Due  Topic Date Due   COVID-19 Vaccine (1) Never done   FOOT EXAM  Never done   Diabetic kidney evaluation - Urine ACR  Never done   Diabetic kidney evaluation - GFR measurement  05/07/2021    There are no preventive care reminders to display for this patient.  Lab Results  Component Value Date   TSH 2.18 05/07/2020   Lab Results  Component Value Date   WBC 6.3  05/07/2020   HGB 15.1 05/07/2020   HCT 44.7 05/07/2020   MCV 95.1 05/07/2020   PLT 247 05/07/2020   Lab Results  Component Value Date   NA 140 05/07/2020   K 4.2 05/07/2020   CO2 21 05/07/2020   GLUCOSE 134 (H) 05/07/2020   BUN  11 05/07/2020   CREATININE 1.08 05/07/2020   BILITOT 0.7 05/07/2020   AST 34 05/07/2020   ALT 41 05/07/2020   PROT 7.1 05/07/2020   CALCIUM 9.6 05/07/2020   ANIONGAP 7 09/13/2017   Lab Results  Component Value Date   CHOL 203 (H) 05/07/2020   Lab Results  Component Value Date   HDL 34 (L) 05/07/2020   Lab Results  Component Value Date   LDLCALC 130 (H) 05/07/2020   Lab Results  Component Value Date   TRIG 240 (H) 05/07/2020   Lab Results  Component Value Date   CHOLHDL 6.0 (H) 05/07/2020   Lab Results  Component Value Date   HGBA1C 5.9 07/26/2022      Assessment & Plan:   Problem List Items Addressed This Visit       Cardiovascular and Mediastinum   Essential hypertension    The following hypertensive lifestyle modification were recommended and discussed:  1. Limiting alcohol intake to less than 1 oz/day of ethanol:(24 oz of beer or 8 oz of wine or 2 oz of 100-proof whiskey). 2. Take baby ASA 81 mg daily. 3. Importance of regular aerobic exercise and losing weight. 4. Reduce dietary saturated fat and cholesterol intake for overall cardiovascular health. 5. Maintaining adequate dietary potassium, calcium, and magnesium intake. 6. Regular monitoring of the blood pressure. 7. Reduce sodium intake to less than 100 mmol/day (less than 2.3 gm of sodium or less than 6 gm of sodium choride)         Digestive   GERD (gastroesophageal reflux disease)    Patient educated extensively on acid reflux lifestyle modification, including buying a bed wedge, not eating 3 hrs before bedtime, diet modifications, and handout given for the same.         Endocrine   Type 2 diabetes mellitus without complication, without long-term current use of  insulin (HCC) - Primary   Relevant Orders   POCT HgB A1C (Completed)   POCT glucose (manual entry) (Completed)   Ambulatory referral to Podiatry     Musculoskeletal and Integument   Arthritis     Of alternative gel as needed        Other   Tobacco abuse counseling    Counseled patient on the dangers of tobacco use, advised patient to stop smoking, and reviewed strategies to maximize success Smoking cessation instruction/counseling given:  counseled patient on the dangers of tobacco use, advised patient to stop smoking, and reviewed strategies to maximize success It is very important that pt quit smoking. There are various alternatives available to help with this difficult task, but first and foremost, pt must make a firm commitment and decision to quit. The nature of nicotine addiction is discussed. The usefulness of behavioral therapy is discussed and suggested.  The correct use, cost and side effects of nicotine replacement therapy such as gum or patches is discussed. Bupropion and its cost (sometimes not covered fully by insurance) and side effects are reviewed. The quit rates are discussed. I recommend pt not allow potential costs of treatment to deter ptfrom using nicotine replacement therapy or bupropion, as the long term economic and health benefits are obvious.       Other Visit Diagnoses     Scratch       Relevant Orders   Tdap vaccine greater than or equal to 7yo IM (Completed)       No orders of the defined types were placed in this encounter.   Follow-up: No follow-ups on  file.    Cletis Athens, MD

## 2022-07-26 NOTE — Assessment & Plan Note (Signed)
  Of alternative gel as needed

## 2022-07-26 NOTE — Assessment & Plan Note (Signed)

## 2022-07-26 NOTE — Assessment & Plan Note (Signed)
Patient educated extensively on acid reflux lifestyle modification, including buying a bed wedge, not eating 3 hrs before bedtime, diet modifications, and handout given for the same.  

## 2022-07-26 NOTE — Assessment & Plan Note (Signed)

## 2022-08-03 ENCOUNTER — Encounter: Payer: Self-pay | Admitting: Podiatry

## 2022-08-03 ENCOUNTER — Ambulatory Visit: Payer: 59 | Admitting: Podiatry

## 2022-08-03 ENCOUNTER — Encounter: Payer: Self-pay | Admitting: *Deleted

## 2022-08-03 DIAGNOSIS — M21961 Unspecified acquired deformity of right lower leg: Secondary | ICD-10-CM | POA: Diagnosis not present

## 2022-08-03 DIAGNOSIS — E119 Type 2 diabetes mellitus without complications: Secondary | ICD-10-CM | POA: Diagnosis not present

## 2022-08-03 DIAGNOSIS — M21962 Unspecified acquired deformity of left lower leg: Secondary | ICD-10-CM | POA: Diagnosis not present

## 2022-08-03 NOTE — Progress Notes (Signed)
Subjective: Eric Owen. presents today referred by Cletis Athens, MD for diabetic foot evaluation.  Patient relates many year history of diabetes.  Patient denies any history of foot wounds.  Patient denies any history of numbness, tingling, burning, pins/needles sensations.  Past Medical History:  Diagnosis Date   Arthritis    Blood transfusion    GERD (gastroesophageal reflux disease)    Hypertension    Peptic ulcer     Patient Active Problem List   Diagnosis Date Noted   Type 2 diabetes mellitus without complication, without long-term current use of insulin ([place]) 08/12/2020   Mixed dyslipidemia 05/13/2020   Tobacco abuse counseling 05/07/2020   Essential hypertension    GERD (gastroesophageal reflux disease)    Arthritis     Past Surgical History:  Procedure Laterality Date   BUNIONECTOMY     CARPAL TUNNEL RELEASE     right   CERVICAL FUSION     COLONOSCOPY N/A 03/04/2015   Procedure: COLONOSCOPY;  Surgeon: Christene Lye, MD;  Location: [facility];  Service: Endoscopy;  Laterality: N/A;   HAMMER TOE SURGERY     POSTERIOR CERVICAL FUSION/FORAMINOTOMY  03/16/2012   Procedure: POSTERIOR CERVICAL FUSION/FORAMINOTOMY LEVEL 1;  Surgeon: Eustace Moore, MD;  Location: [place] [facility];  Service: Neurosurgery;  Laterality: N/A;  Cervical six-seven posterior cervical fusion with lateral mass screws    Current Outpatient Medications on File Prior to Visit  Medication Sig Dispense Refill   cholecalciferol (VITAMIN D3) 25 MCG (1000 UNIT) tablet Take 1 tablet (1,000 Units total) by mouth daily. 90 tablet 3   fenofibrate 160 MG tablet Take 1 tablet (160 mg total) by mouth daily. 90 tablet 3   HYDROcodone-acetaminophen (NORCO) 5-325 MG per tablet Take 1 tablet by mouth every 6 (six) hours as needed. For pain     losartan (COZAAR) 25 MG tablet Take 1 tablet (25 mg total) by mouth daily. 90 tablet 3   metFORMIN (GLUCOPHAGE-XR) 500 MG 24 hr tablet TAKE 1 TABLET(500 MG) BY  MOUTH DAILY WITH BREAKFAST 90 tablet 3   ranitidine (ZANTAC) 150 MG tablet Take 150 mg by mouth every morning.      rosuvastatin (CRESTOR) 10 MG tablet Take 1 tablet (10 mg total) by mouth daily. 90 tablet 3   sildenafil (REVATIO) 20 MG tablet TAKE 1 TABLET BY MOUTH DAILY AS NEEDED 30 tablet 6   No current facility-administered medications on file prior to visit.     Allergies  Allergen Reactions   Aspirin Nausea Only    Social History   Occupational History   Not on file  Tobacco Use   Smoking status: Former    Packs/day: 0.40    Years: 18.00    Total pack years: 7.20    Types: Cigarettes    Quit date: 01/27/2006    Years since quitting: 16.5   Smokeless tobacco: Never  Substance and Sexual Activity   Alcohol use: Yes    Comment: occasionally   Drug use: No   Sexual activity: Not on file    History reviewed. No pertinent family history.  Immunization History  Administered Date(s) Administered   Tdap 07/26/2022    Review of systems: Positive Findings in bold print.  Constitutional:  chills, fatigue, fever, sweats, weight change Communication: Optometrist, sign Ecologist, hand writing, iPad/Android device Head: headaches, head injury Eyes: changes in vision, eye pain, glaucoma, cataracts, macular degeneration, diplopia, glare,  light sensitivity, eyeglasses or contacts, blindness Ears nose mouth throat: hearing impaired, hearing  aids,  ringing in ears, deaf, sign language,  vertigo, nosebleeds,  rhinitis,  cold sores, snoring, swollen glands Cardiovascular: HTN, edema, arrhythmia, pacemaker in place, defibrillator in place, chest pain/tightness, chronic anticoagulation, blood clot, heart failure, MI Peripheral Vascular: leg cramps, varicose veins, blood clots, lymphedema, varicosities Respiratory:  asthma, difficulty breathing, denies congestion, SOB, wheezing, cough, emphysema Gastrointestinal: change in appetite or weight, abdominal pain, constipation,  diarrhea, nausea, vomiting, vomiting blood, change in bowel habits, abdominal pain, jaundice, rectal bleeding, hemorrhoids, GERD Genitourinary:  nocturia,  pain on urination, polyuria,  blood in urine, Foley catheter, urinary urgency, ESRD on hemodialysis Musculoskeletal: amputation, cramping, stiff joints, painful joints, decreased joint motion, fractures, OA, gout, hemiplegia, paraplegia, uses cane, wheelchair bound, uses walker, uses rollator Skin: +changes in toenails, color change, dryness, itching, mole changes,  rash, wound(s) Neurological: headaches, numbness in feet, paresthesias in feet, burning in feet, fainting,  seizures, change in speech, migraines, memory problems/poor historian, cerebral palsy, weakness, paralysis, CVA, TIA Endocrine: diabetes, hypothyroidism, hyperthyroidism,  goiter, dry mouth, flushing, heat intolerance, cold intolerance,  excessive thirst, denies polyuria,  nocturia Hematological:  easy bleeding, excessive bleeding, easy bruising, enlarged lymph nodes, on long term blood thinner, history of past transusions Allergy/immunological:  hives, eczema, frequent infections, multiple drug allergies, seasonal allergies, transplant recipient, multiple food allergies Psychiatric:  anxiety, depression, mood disorder, suicidal ideations, hallucinations, insomnia  Objective: There were no vitals filed for this visit. Vascular Examination: Capillary refill time less than 3 seconds x 10 digits.  Dorsalis pedis pulses palpable 2 out of 4.  Posterior tibial pulses palpable 2 out of 4.  Digital hair not present x 10 digits.  Skin temperature gradient WNL b/l.  Dermatological Examination: Skin with normal turgor, texture and tone b/l  Toenails 1-5 b/l discolored, thick, dystrophic with subungual debris and pain with palpation to nailbeds due to thickness of nails.  Musculoskeletal: Muscle strength 5/5 to all LE muscle groups.  Pes planovalgus foot structure noted with  calcaneovalgus to many toe signs partially recruit the arch with dorsiflexion of the hallux  Neurological: Sensation intact with 10 gram monofilament.  Vibratory sensation intact.  Assessment: NIDDM Encounter for diabetic foot examination Pes planovalgus/deformity  Plan: Discussed diabetic foot care principles. Literature dispensed on today. Patient to continue soft, supportive shoe gear daily. Patient to report any pedal injuries to medical professional immediately. Follow up one year. Patient/POA to call should there be a concern in the interim. Pes planovalgus/foot deformity -I explained to patient the etiology of pes planovalgus and relationship with Planter fasciitis and various treatment options were discussed.  Given patient foot structure in the setting of Planter fasciitis I believe patient will benefit from custom-made orthotics to help control the hindfoot motion support the arch of the foot and take the stress away from plantar fascial.  Patient agrees with the plan like to proceed with orthotics -Patient was casted for orthotics

## 2022-08-30 ENCOUNTER — Encounter: Payer: 59 | Admitting: Internal Medicine

## 2022-08-31 ENCOUNTER — Telehealth: Payer: Self-pay | Admitting: Podiatry

## 2022-08-31 NOTE — Telephone Encounter (Signed)
Left message on vm for pt to call back to schedule appt to pick up orthotics  

## 2022-09-06 ENCOUNTER — Other Ambulatory Visit (HOSPITAL_COMMUNITY): Payer: Self-pay

## 2022-09-06 ENCOUNTER — Telehealth: Payer: Self-pay

## 2022-09-06 NOTE — Telephone Encounter (Signed)
Pharmacy Patient Advocate Encounter   Received notification from [employer] that prior authorization for Sildenafil 20 MG is required/requested.   PA submitted on 09/06/2022 via CoverMyMeds Key [id number] Status is pending  Kristie Cowman, CPHT Pharmacy Patient Advocate Specialist [facility] Patient Advocate Team Direct Number: [phone] Fax: [phone]

## 2022-09-07 ENCOUNTER — Other Ambulatory Visit (HOSPITAL_COMMUNITY): Payer: Self-pay

## 2022-09-07 NOTE — Telephone Encounter (Signed)
  Received a fax from [facility] regarding Prior Authorization for Sildenafil Citrate 20MG .   Authorization has been DENIED due to Covered uses are for pulmonary arterial hypertension (PAH) and secondary Raynaud's phenomenon.. Not for Essential Hypertension  Key: [id number]   Discount card for patient pharmacy for 20.95  BIN PCN [employer] [id number] Member ID [id number]

## 2022-09-14 ENCOUNTER — Ambulatory Visit (INDEPENDENT_AMBULATORY_CARE_PROVIDER_SITE_OTHER): Payer: 59 | Admitting: Internal Medicine

## 2022-09-14 ENCOUNTER — Encounter: Payer: Self-pay | Admitting: Internal Medicine

## 2022-09-14 VITALS — BP 130/75 | HR 63 | Temp 97.0°F | Ht 71.75 in | Wt 191.6 lb

## 2022-09-14 DIAGNOSIS — Z716 Tobacco abuse counseling: Secondary | ICD-10-CM

## 2022-09-14 DIAGNOSIS — Z0000 Encounter for general adult medical examination without abnormal findings: Secondary | ICD-10-CM | POA: Diagnosis not present

## 2022-09-14 DIAGNOSIS — I10 Essential (primary) hypertension: Secondary | ICD-10-CM | POA: Diagnosis not present

## 2022-09-14 DIAGNOSIS — E119 Type 2 diabetes mellitus without complications: Secondary | ICD-10-CM

## 2022-09-14 DIAGNOSIS — M1990 Unspecified osteoarthritis, unspecified site: Secondary | ICD-10-CM | POA: Diagnosis not present

## 2022-09-14 DIAGNOSIS — K219 Gastro-esophageal reflux disease without esophagitis: Secondary | ICD-10-CM

## 2022-09-14 DIAGNOSIS — E559 Vitamin D deficiency, unspecified: Secondary | ICD-10-CM

## 2022-09-14 DIAGNOSIS — E782 Mixed hyperlipidemia: Secondary | ICD-10-CM

## 2022-09-14 DIAGNOSIS — Z125 Encounter for screening for malignant neoplasm of prostate: Secondary | ICD-10-CM

## 2022-09-14 NOTE — Addendum Note (Signed)
Addended by: Jama Flavors on: 09/14/2022 11:02 AM   Modules accepted: Orders

## 2022-09-14 NOTE — Assessment & Plan Note (Signed)
-   The patient's GERD is stable on medication.  - Instructed the patient to avoid eating spicy and acidic foods, as well as foods high in fat. - Instructed the patient to avoid eating large meals or meals 2-3 hours prior to sleeping. 

## 2022-09-14 NOTE — Progress Notes (Signed)
Established Patient Office Visit  Subjective:  Patient ID: Eric Owen., male    DOB: [birth year]  Age: 56 y.o. MRN: [id number]  CC:  Chief Complaint  Patient presents with   Annual Exam    HPI  Eric Owen. presents for physical  Past Medical History:  Diagnosis Date   Arthritis    Blood transfusion    GERD (gastroesophageal reflux disease)    Hypertension    Peptic ulcer     Past Surgical History:  Procedure Laterality Date   BUNIONECTOMY     CARPAL TUNNEL RELEASE     right   CERVICAL FUSION     COLONOSCOPY N/A 03/04/2015   Procedure: COLONOSCOPY;  Surgeon: Kieth Brightly, MD;  Location: [facility];  Service: Endoscopy;  Laterality: N/A;   HAMMER TOE SURGERY     POSTERIOR CERVICAL FUSION/FORAMINOTOMY  03/16/2012   Procedure: POSTERIOR CERVICAL FUSION/FORAMINOTOMY LEVEL 1;  Surgeon: Tia Alert, MD;  Location: [facility];  Service: Neurosurgery;  Laterality: N/A;  Cervical six-seven posterior cervical fusion with lateral mass screws    History reviewed. No pertinent family history.  Social History   Socioeconomic History   Marital status: Married    Spouse name: Not on file   Number of children: Not on file   Years of education: Not on file   Highest education level: Not on file  Occupational History   Not on file  Tobacco Use   Smoking status: Former    Packs/day: 0.40    Years: 18.00    Total pack years: 7.20    Types: Cigarettes    Quit date: 01/27/2006    Years since quitting: 16.6   Smokeless tobacco: Never  Substance and Sexual Activity   Alcohol use: Yes    Comment: occasionally   Drug use: No   Sexual activity: Not on file  Other Topics Concern   Not on file  Social History Narrative   Not on file   Social Determinants of Health   Financial Resource Strain: Not on file  Food Insecurity: Not on file  Transportation Needs: Not on file  Physical Activity: Not on file  Stress: Not on file  Social Connections: Not on  file  Intimate Partner Violence: Not on file     Current Outpatient Medications:    cholecalciferol (VITAMIN D3) 25 MCG (1000 UNIT) tablet, Take 1 tablet (1,000 Units total) by mouth daily., Disp: 90 tablet, Rfl: 3   fenofibrate 160 MG tablet, Take 1 tablet (160 mg total) by mouth daily., Disp: 90 tablet, Rfl: 3   HYDROcodone-acetaminophen (NORCO) 5-325 MG per tablet, Take 1 tablet by mouth every 6 (six) hours as needed. For pain, Disp: , Rfl:    losartan (COZAAR) 25 MG tablet, Take 1 tablet (25 mg total) by mouth daily., Disp: 90 tablet, Rfl: 3   metFORMIN (GLUCOPHAGE-XR) 500 MG 24 hr tablet, TAKE 1 TABLET(500 MG) BY MOUTH DAILY WITH BREAKFAST, Disp: 90 tablet, Rfl: 3   ranitidine (ZANTAC) 150 MG tablet, Take 150 mg by mouth every morning. , Disp: , Rfl:    sildenafil (REVATIO) 20 MG tablet, TAKE 1 TABLET BY MOUTH DAILY AS NEEDED, Disp: 30 tablet, Rfl: 6   Allergies  Allergen Reactions   Aspirin Nausea Only    ROS Review of Systems  Constitutional: Negative.   HENT: Negative.    Eyes: Negative.   Respiratory: Negative.    Cardiovascular: Negative.   Gastrointestinal: Negative.   Endocrine: Negative.  Genitourinary: Negative.   Musculoskeletal:  Positive for arthralgias.  Skin: Negative.   Allergic/Immunologic: Negative.   Neurological: Negative.   Hematological: Negative.   Psychiatric/Behavioral: Negative.    All other systems reviewed and are negative.     Objective:    Physical Exam Vitals reviewed.  Constitutional:      Appearance: Normal appearance.  HENT:     Mouth/Throat:     Mouth: Mucous membranes are moist.  Eyes:     Pupils: Pupils are equal, round, and reactive to light.  Neck:     Vascular: No carotid bruit.  Cardiovascular:     Rate and Rhythm: Normal rate and regular rhythm.     Pulses: Normal pulses.     Heart sounds: Normal heart sounds.  Pulmonary:     Effort: Pulmonary effort is normal.     Breath sounds: Normal breath sounds.   Abdominal:     General: Bowel sounds are normal.     Palpations: Abdomen is soft. There is no hepatomegaly, splenomegaly or mass.     Tenderness: There is no abdominal tenderness.     Hernia: No hernia is present.  Genitourinary:    Penis: Normal.      Testes: Normal.     Comments: Prostate  is 3 plus  no nodule  will get psa Musculoskeletal:     Cervical back: Neck supple.     Right lower leg: No edema.     Left lower leg: No edema.  Skin:    Findings: No rash.  Neurological:     Mental Status: He is alert and oriented to person, place, and time.     Motor: No weakness.  Psychiatric:        Mood and Affect: Mood normal.        Behavior: Behavior normal.     BP 130/75   Pulse 63   Temp (!) 97 F (36.1 C) (Temporal)   Ht 5' 11.75" (1.822 m)   Wt 191 lb 9.6 oz (86.9 kg)   SpO2 98%   BMI 26.17 kg/m  Wt Readings from Last 3 Encounters:  09/14/22 191 lb 9.6 oz (86.9 kg)  07/26/22 194 lb 14.4 oz (88.4 kg)  09/23/21 199 lb (90.3 kg)     Health Maintenance Due  Topic Date Due   FOOT EXAM  Never done   Diabetic kidney evaluation - Urine ACR  Never done   Diabetic kidney evaluation - GFR measurement  05/07/2021    There are no preventive care reminders to display for this patient.  Lab Results  Component Value Date   TSH 2.18 05/07/2020   Lab Results  Component Value Date   WBC 6.3 05/07/2020   HGB 15.1 05/07/2020   HCT 44.7 05/07/2020   MCV 95.1 05/07/2020   PLT 247 05/07/2020   Lab Results  Component Value Date   NA 140 05/07/2020   K 4.2 05/07/2020   CO2 21 05/07/2020   GLUCOSE 134 (H) 05/07/2020   BUN 11 05/07/2020   CREATININE 1.08 05/07/2020   BILITOT 0.7 05/07/2020   AST 34 05/07/2020   ALT 41 05/07/2020   PROT 7.1 05/07/2020   CALCIUM 9.6 05/07/2020   ANIONGAP 7 09/13/2017   Lab Results  Component Value Date   CHOL 203 (H) 05/07/2020   Lab Results  Component Value Date   HDL 34 (L) 05/07/2020   Lab Results  Component Value Date    LDLCALC 130 (H) 05/07/2020   Lab Results  Component Value Date   TRIG 240 (H) 05/07/2020   Lab Results  Component Value Date   CHOLHDL 6.0 (H) 05/07/2020   Lab Results  Component Value Date   HGBA1C 5.9 07/26/2022      Assessment & Plan:   Problem List Items Addressed This Visit       Cardiovascular and Mediastinum   Essential hypertension     Digestive   GERD (gastroesophageal reflux disease)    - The patient's GERD is stable on medication.  - Instructed the patient to avoid eating spicy and acidic foods, as well as foods high in fat. - Instructed the patient to avoid eating large meals or meals 2-3 hours prior to sleeping.        Endocrine   Type 2 diabetes mellitus without complication, without long-term current use of insulin (HCC) - Primary   Relevant Orders   Microalbumin, urine     Musculoskeletal and Integument   Arthritis     Other   Tobacco abuse counseling    - I instructed the patient to stop smoking and provided them with smoking cessation materials.  - I informed the patient that smoking puts them at increased risk for cancer, COPD, hypertension, and more.  - Informed the patient to seek help if they begin to have trouble breathing, develop chest pain, start to cough up blood, feel faint, or pass out.      Mixed dyslipidemia   Encounter for preventive care    No orders of the defined types were placed in this encounter.   Follow-up: No follow-ups on file.    Corky Downs, MD

## 2022-09-14 NOTE — Assessment & Plan Note (Signed)
-   I instructed the patient to stop smoking and provided them with smoking cessation materials.  - I informed the patient that smoking puts them at increased risk for cancer, COPD, hypertension, and more.  - Informed the patient to seek help if they begin to have trouble breathing, develop chest pain, start to cough up blood, feel faint, or pass out.  

## 2022-09-15 LAB — COMPLETE METABOLIC PANEL WITH GFR
AG Ratio: 1.5 (calc) (ref 1.0–2.5)
ALT: 35 U/L (ref 9–46)
AST: 31 U/L (ref 10–35)
Albumin: 4.6 g/dL (ref 3.6–5.1)
Alkaline phosphatase (APISO): 77 U/L (ref 35–144)
BUN: 15 mg/dL (ref 7–25)
CO2: 26 mmol/L (ref 20–32)
Calcium: 9.9 mg/dL (ref 8.6–10.3)
Chloride: 101 mmol/L (ref 98–110)
Creat: 1.02 mg/dL (ref 0.70–1.30)
Globulin: 3 g/dL (calc) (ref 1.9–3.7)
Glucose, Bld: 166 mg/dL — ABNORMAL HIGH (ref 65–99)
Potassium: 4.5 mmol/L (ref 3.5–5.3)
Sodium: 137 mmol/L (ref 135–146)
Total Bilirubin: 0.7 mg/dL (ref 0.2–1.2)
Total Protein: 7.6 g/dL (ref 6.1–8.1)
eGFR: 87 mL/min/{1.73_m2} (ref >=60)

## 2022-09-15 LAB — CBC WITH DIFFERENTIAL/PLATELET
Absolute Monocytes: 463 cells/uL (ref 200–950)
Basophils Absolute: 73 cells/uL (ref 0–200)
Basophils Relative: 1.4 %
Eosinophils Absolute: 140 cells/uL (ref 15–500)
Eosinophils Relative: 2.7 %
HCT: 45.4 % (ref 38.5–50.0)
Hemoglobin: 15.8 g/dL (ref 13.2–17.1)
Lymphs Abs: 2044 cells/uL (ref 850–3900)
MCH: 32.5 pg (ref 27.0–33.0)
MCHC: 34.8 g/dL (ref 32.0–36.0)
MCV: 93.4 fL (ref 80.0–100.0)
MPV: 11 fL (ref 7.5–12.5)
Monocytes Relative: 8.9 %
Neutro Abs: 2480 cells/uL (ref 1500–7800)
Neutrophils Relative %: 47.7 %
Platelets: 312 10*3/uL (ref 140–400)
RBC: 4.86 10*6/uL (ref 4.20–5.80)
RDW: 12.2 % (ref 11.0–15.0)
Total Lymphocyte: 39.3 %
WBC: 5.2 10*3/uL (ref 3.8–10.8)

## 2022-09-15 LAB — LIPID PANEL
Cholesterol: 192 mg/dL (ref <200)
HDL: 34 mg/dL — ABNORMAL LOW (ref >=40)
LDL Cholesterol (Calc): 124 mg/dL (calc) — ABNORMAL HIGH
Non-HDL Cholesterol (Calc): 158 mg/dL (calc) — ABNORMAL HIGH (ref <130)
Total CHOL/HDL Ratio: 5.6 (calc) — ABNORMAL HIGH (ref <5.0)
Triglycerides: 222 mg/dL — ABNORMAL HIGH (ref <150)

## 2022-09-15 LAB — MICROALBUMIN, URINE: Microalb, Ur: 0.4 mg/dL

## 2022-09-15 LAB — VITAMIN D 25 HYDROXY (VIT D DEFICIENCY, FRACTURES): Vit D, 25-Hydroxy: 16 ng/mL — ABNORMAL LOW (ref 30–100)

## 2022-09-15 LAB — PSA: PSA: 0.31 ng/mL (ref <=4.00)

## 2022-09-24 ENCOUNTER — Ambulatory Visit (INDEPENDENT_AMBULATORY_CARE_PROVIDER_SITE_OTHER): Payer: 59 | Admitting: Podiatry

## 2022-09-24 VITALS — BP 109/49 | HR 52

## 2022-09-24 DIAGNOSIS — M21961 Unspecified acquired deformity of right lower leg: Secondary | ICD-10-CM

## 2022-09-24 DIAGNOSIS — M21962 Unspecified acquired deformity of left lower leg: Secondary | ICD-10-CM

## 2022-09-24 NOTE — Progress Notes (Signed)
Patient presents to pick up custom molded orthotics.  The orthotics fit well. Wearing instructions were given.  Patient was advised to return as needed. 

## 2022-09-24 NOTE — Patient Instructions (Signed)

## 2022-12-06 ENCOUNTER — Ambulatory Visit: Payer: 59 | Admitting: Internal Medicine

## 2023-05-31 ENCOUNTER — Encounter: Payer: Self-pay | Admitting: Emergency Medicine

## 2023-05-31 ENCOUNTER — Other Ambulatory Visit: Payer: Self-pay

## 2023-05-31 ENCOUNTER — Emergency Department
Admission: EM | Admit: 2023-05-31 | Discharge: 2023-05-31 | Disposition: A | Discharge status: Home / Self Care | Payer: 59 | Source: Home / Self Care | Attending: Emergency Medicine | Admitting: Emergency Medicine

## 2023-05-31 ENCOUNTER — Emergency Department: Payer: 59

## 2023-05-31 DIAGNOSIS — S0990XA Unspecified injury of head, initial encounter: Secondary | ICD-10-CM | POA: Diagnosis present

## 2023-05-31 DIAGNOSIS — E119 Type 2 diabetes mellitus without complications: Secondary | ICD-10-CM | POA: Insufficient documentation

## 2023-05-31 DIAGNOSIS — S0003XA Contusion of scalp, initial encounter: Secondary | ICD-10-CM | POA: Insufficient documentation

## 2023-05-31 DIAGNOSIS — Z7984 Long term (current) use of oral hypoglycemic drugs: Secondary | ICD-10-CM | POA: Insufficient documentation

## 2023-05-31 DIAGNOSIS — W228XXA Striking against or struck by other objects, initial encounter: Secondary | ICD-10-CM | POA: Diagnosis not present

## 2023-05-31 DIAGNOSIS — Z79899 Other long term (current) drug therapy: Secondary | ICD-10-CM | POA: Insufficient documentation

## 2023-05-31 DIAGNOSIS — I10 Essential (primary) hypertension: Secondary | ICD-10-CM | POA: Insufficient documentation

## 2023-05-31 NOTE — ED Triage Notes (Addendum)
Patient ambulatory to triage with steady gait, without difficulty or distress noted; pt reports around 7pm, at work, hit his head on a steel bar; abrasion noted to top of scalp; denies LOC or HA; st some pain to his neck with hx of cervical rod; c-collar placed in triage

## 2023-05-31 NOTE — ED Provider Notes (Signed)
[facility] Provider Note    Event Date/Time   First MD Initiated Contact with Patient 05/31/23 0136     (approximate)   History   Head Injury   HPI  Eric Owen. is a 57 y.o. male with history of hypertension, diabetes who presents to the emergency department after head injury.  Reports he was at work and bent down and stood up and hit the top of his head on a steel beam.  States he felt "dazed" afterwards but no loss of consciousness and did not get knocked down to the ground.  History of prior neck surgery and felt stiffness in his neck afterwards.  No numbness, tingling, weakness, nausea or vomiting, vision or speech changes.  Not on blood thinners.   History provided by patient.    Past Medical History:  Diagnosis Date   Arthritis    Blood transfusion    GERD (gastroesophageal reflux disease)    Hypertension    Peptic ulcer     Past Surgical History:  Procedure Laterality Date   BUNIONECTOMY     CARPAL TUNNEL RELEASE     right   CERVICAL FUSION     COLONOSCOPY N/A 03/04/2015   Procedure: COLONOSCOPY;  Surgeon: Kieth Brightly, MD;  Location: [facility];  Service: Endoscopy;  Laterality: N/A;   HAMMER TOE SURGERY     POSTERIOR CERVICAL FUSION/FORAMINOTOMY  03/16/2012   Procedure: POSTERIOR CERVICAL FUSION/FORAMINOTOMY LEVEL 1;  Surgeon: Tia Alert, MD;  Location: [facility];  Service: Neurosurgery;  Laterality: N/A;  Cervical six-seven posterior cervical fusion with lateral mass screws    MEDICATIONS:  Prior to Admission medications   Medication Sig Start Date End Date Taking? Authorizing Provider  cholecalciferol (VITAMIN D3) 25 MCG (1000 UNIT) tablet Take 1 tablet (1,000 Units total) by mouth daily. 06/07/22   Corky Downs, MD  fenofibrate 160 MG tablet Take 1 tablet (160 mg total) by mouth daily. 06/07/22   Corky Downs, MD  HYDROcodone-acetaminophen (NORCO) 5-325 MG per tablet Take 1 tablet by mouth every 6 (six) hours  as needed. For pain    [provider]  losartan (COZAAR) 25 MG tablet Take 1 tablet (25 mg total) by mouth daily. 09/23/21   Corky Downs, MD  metFORMIN (GLUCOPHAGE-XR) 500 MG 24 hr tablet TAKE 1 TABLET(500 MG) BY MOUTH DAILY WITH BREAKFAST 06/07/22   Corky Downs, MD  ranitidine (ZANTAC) 150 MG tablet Take 150 mg by mouth every morning.     [provider]  sildenafil (REVATIO) 20 MG tablet TAKE 1 TABLET BY MOUTH DAILY AS NEEDED 02/10/22   Corky Downs, MD    Physical Exam   Triage Vital Signs: ED Triage Vitals  Encounter Vitals Group     BP 05/31/23 0038 (!) 145/81     Systolic BP Percentile --      Diastolic BP Percentile --      Pulse Rate 05/31/23 0038 60     Resp 05/31/23 0038 18     Temp 05/31/23 0038 98 F (36.7 C)     Temp Source 05/31/23 0038 Oral     SpO2 05/31/23 0038 97 %     Weight 05/31/23 0029 198 lb (89.8 kg)     Height 05/31/23 0029 5\' 11"  (1.803 m)     Head Circumference --      Peak Flow --      Pain Score 05/31/23 0029 4     Pain Loc --  Pain Education --      Exclude from Growth Chart --     Most recent vital signs: Vitals:   05/31/23 0038  BP: (!) 145/81  Pulse: 60  Resp: 18  Temp: 98 F (36.7 C)  SpO2: 97%     CONSTITUTIONAL: Alert, responds appropriately to questions. Well-appearing; well-nourished; GCS 15 HEAD: Normocephalic; superficial abrasion to the top of the scalp with associated small hematoma EYES: Conjunctivae clear, PERRL, EOMI ENT: normal nose; no rhinorrhea; moist mucous membranes; pharynx without lesions noted; no dental injury; no septal hematoma, no epistaxis; no facial deformity or bony tenderness NECK: Supple, no midline spinal tenderness, step-off or deformity; trachea midline CARD: RRR; S1 and S2 appreciated; no murmurs, no clicks, no rubs, no gallops RESP: Normal chest excursion without splinting or tachypnea; breath sounds clear and equal bilaterally; no wheezes, no rhonchi, no rales; no hypoxia or  respiratory distress CHEST:  chest wall stable, no crepitus or ecchymosis or deformity, nontender to palpation; no flail chest ABD/GI: Non-distended; soft, non-tender, no rebound, no guarding; no ecchymosis or other lesions noted PELVIS:  stable, nontender to palpation BACK:  The back appears normal; no midline spinal tenderness, step-off or deformity EXT: Normal ROM in all joints; no edema; normal capillary refill; no cyanosis, no bony tenderness or bony deformity of patient's extremities, no joint effusions, compartments are soft, extremities are warm and well-perfused, no ecchymosis SKIN: Normal color for age and race; warm NEURO: No facial asymmetry, normal speech, moving all extremities equally, normal gait  ED Results / Procedures / Treatments   LABS: (all labs ordered are listed, but only abnormal results are displayed) Labs Reviewed - No data to display   EKG:  RADIOLOGY: My personal review and interpretation of imaging: CT head and cervical spine showed no traumatic injury.  I have personally reviewed all radiology reports. CT Head Wo Contrast  Result Date: 05/31/2023 CLINICAL DATA:  Trauma EXAM: CT HEAD WITHOUT CONTRAST CT CERVICAL SPINE WITHOUT CONTRAST TECHNIQUE: Multidetector CT imaging of the head and cervical spine was performed following the standard protocol without intravenous contrast. Multiplanar CT image reconstructions of the cervical spine were also generated. RADIATION DOSE REDUCTION: This exam was performed according to the departmental dose-optimization program which includes automated exposure control, adjustment of the mA and/or kV according to patient size and/or use of iterative reconstruction technique. COMPARISON:  CT head dated 10/29/2013 FINDINGS: CT HEAD FINDINGS Brain: No evidence of acute infarction, hemorrhage, hydrocephalus, extra-axial collection or mass lesion/mass effect. Vascular: No hyperdense vessel or unexpected calcification. Skull: Normal.  Negative for fracture or focal lesion. Sinuses/Orbits: The visualized paranasal sinuses are essentially clear. The mastoid air cells are unopacified. Other: None. CT CERVICAL SPINE FINDINGS Alignment: Normal cervical lordosis. Skull base and vertebrae: No acute fracture. No primary bone lesion or focal pathologic process. Soft tissues and spinal canal: No prevertebral fluid or swelling. No visible canal hematoma. Disc levels: Status post PLIF at C6-7. Mild degenerative changes, most prominent at C5-6. Spinal canal is patent. Upper chest: Visualized lung apices are clear. Other: Visualized thyroid is unremarkable. IMPRESSION: Normal head CT. No traumatic injury to the cervical spine. Status post PLIF at C6-7. Mild degenerative changes. Electronically Signed   By: Charline Bills M.D.   On: 05/31/2023 00:59   CT Cervical Spine Wo Contrast  Result Date: 05/31/2023 CLINICAL DATA:  Trauma EXAM: CT HEAD WITHOUT CONTRAST CT CERVICAL SPINE WITHOUT CONTRAST TECHNIQUE: Multidetector CT imaging of the head and cervical spine was performed following the standard protocol  without intravenous contrast. Multiplanar CT image reconstructions of the cervical spine were also generated. RADIATION DOSE REDUCTION: This exam was performed according to the departmental dose-optimization program which includes automated exposure control, adjustment of the mA and/or kV according to patient size and/or use of iterative reconstruction technique. COMPARISON:  CT head dated 10/29/2013 FINDINGS: CT HEAD FINDINGS Brain: No evidence of acute infarction, hemorrhage, hydrocephalus, extra-axial collection or mass lesion/mass effect. Vascular: No hyperdense vessel or unexpected calcification. Skull: Normal. Negative for fracture or focal lesion. Sinuses/Orbits: The visualized paranasal sinuses are essentially clear. The mastoid air cells are unopacified. Other: None. CT CERVICAL SPINE FINDINGS Alignment: Normal cervical lordosis. Skull base and  vertebrae: No acute fracture. No primary bone lesion or focal pathologic process. Soft tissues and spinal canal: No prevertebral fluid or swelling. No visible canal hematoma. Disc levels: Status post PLIF at C6-7. Mild degenerative changes, most prominent at C5-6. Spinal canal is patent. Upper chest: Visualized lung apices are clear. Other: Visualized thyroid is unremarkable. IMPRESSION: Normal head CT. No traumatic injury to the cervical spine. Status post PLIF at C6-7. Mild degenerative changes. Electronically Signed   By: Charline Bills M.D.   On: 05/31/2023 00:59     PROCEDURES:  Critical Care performed: No  Procedures    IMPRESSION / MDM / ASSESSMENT AND PLAN / ED COURSE  I reviewed the triage vital signs and the nursing notes.  Patient here after head injury that occurred at work.     DIFFERENTIAL DIAGNOSIS (includes but not limited to):   Head injury, scalp abrasion, concussion, skull fracture, intracranial hemorrhage, cervical sprain, cervical fracture  Patient's presentation is most consistent with acute presentation with potential threat to life or bodily function.  PLAN: CT head and cervical spine obtained from triage reviewed and interpreted by myself and the radiologist and show no acute traumatic injury.  Superficial abrasion to the top of his scalp.  Neuro intact here.  Discussed head injury return precautions and recommended brain rest for the next 48 hours.  I feel he is safe to go back to work.  He states he is all for the next couple of days.  Recommended Tylenol for pain control at home.  He is comfortable with this plan.  No other injuries on exam.   MEDICATIONS GIVEN IN ED: Medications - No data to display   ED COURSE:  At this time, I do not feel there is any life-threatening condition present. I reviewed all nursing notes, vitals, pertinent previous records.  All lab and urine results, EKGs, imaging ordered have been independently reviewed and interpreted by  myself.  I reviewed all available radiology reports from any imaging ordered this visit.  Based on my assessment, I feel the patient is safe to be discharged home without further emergent workup and can continue workup as an outpatient as needed. Discussed all findings, treatment plan as well as usual and customary return precautions.  They verbalize understanding and are comfortable with this plan.  Outpatient follow-up has been provided as needed.  All questions have been answered.    CONSULTS:  none   OUTSIDE RECORDS REVIEWED: Reviewed recent internal medicine notes.       FINAL CLINICAL IMPRESSION(S) / ED DIAGNOSES   Final diagnoses:  Injury of head, initial encounter     Rx / DC Orders   ED Discharge Orders     None        Note:  This document was prepared using Dragon voice recognition software and may  include unintentional dictation errors.   Priscilla Finklea, Layla Maw, DO 05/31/23 [phone]

## 2023-05-31 NOTE — Discharge Instructions (Addendum)
You have had a head injury today.  Please avoid alcohol, sedatives for the next week.  Please rest and drink plenty of water.  We recommend that you avoid any activity that may lead to another head injury for at least 1 week or until your symptoms have completely resolved.  We also recommend "brain rest" to ensure the best possible long term outcomes - please avoid TV, cell phones, tablets, computers as much as possible for the next 48 hours.     You may take over-the-counter Tylenol 1000 mg every 6 hours as needed for pain.   Please return to the emergency department if you begin having severe headache that is not controlled with medication at home, nausea and vomiting that does not stop, numbness or weakness in one-sided your body, changes in your vision or speech.

## 2024-01-25 ENCOUNTER — Ambulatory Visit: Attending: Student

## 2024-01-25 DIAGNOSIS — M542 Cervicalgia: Secondary | ICD-10-CM | POA: Insufficient documentation

## 2024-01-25 DIAGNOSIS — M5412 Radiculopathy, cervical region: Secondary | ICD-10-CM | POA: Insufficient documentation

## 2024-01-25 NOTE — Therapy (Signed)
 OUTPATIENT PHYSICAL THERAPY NECK EVALUATION   Patient Name: Eric Owen. MRN: [id number] DOB:07-24-1966, 58 y.o., male Today's Date: 01/25/2024  END OF SESSION:  PT End of Session - 01/25/24 1559     Visit Number 1    Number of Visits 25    Date for PT Re-Evaluation 04/18/24    Authorization Type [id number]    Authorization - Number of Visits 60    Progress Note Due on Visit 10    PT Start Time 1600    PT Stop Time 1642    PT Time Calculation (min) 42 min    Activity Tolerance Patient tolerated treatment well    Behavior During Therapy WFL for tasks assessed/performed             Past Medical History:  Diagnosis Date   Arthritis    Blood transfusion    GERD (gastroesophageal reflux disease)    Hypertension    Peptic ulcer    Past Surgical History:  Procedure Laterality Date   BUNIONECTOMY     CARPAL TUNNEL RELEASE     right   CERVICAL FUSION     COLONOSCOPY N/A 03/04/2015   Procedure: COLONOSCOPY;  Surgeon: Kieth Brightly, MD;  Location: [facility];  Service: Endoscopy;  Laterality: N/A;   HAMMER TOE SURGERY     POSTERIOR CERVICAL FUSION/FORAMINOTOMY  03/16/2012   Procedure: POSTERIOR CERVICAL FUSION/FORAMINOTOMY LEVEL 1;  Surgeon: Tia Alert, MD;  Location: [facility];  Service: Neurosurgery;  Laterality: N/A;  Cervical six-seven posterior cervical fusion with lateral mass screws   Patient Active Problem List   Diagnosis Date Noted   Encounter for preventive care 09/14/2022   Type 2 diabetes mellitus without complication, without long-term current use of insulin (HCC) 08/12/2020   Mixed dyslipidemia 05/13/2020   Tobacco abuse counseling 05/07/2020   Essential hypertension    GERD (gastroesophageal reflux disease)    Arthritis     PCP: Corky Downs, MD  REFERRING PROVIDER: Sherryl Manges, NP  REFERRING DIAG:  M54.12 (ICD-10-CM) - Radiculopathy, cervical region    RATIONALE FOR EVALUATION AND TREATMENT:  Rehabilitation  THERAPY DIAG: Radiculopathy, cervical region  Cervicalgia  ONSET DATE: Chronic Neck pain   FOLLOW-UP APPT SCHEDULED WITH REFERRING PROVIDER: Yes April 24th    SUBJECTIVE:                                                                                                                                                                                         Chief Complaint: Neck pain   Pertinent History Pt reports pain that starts in the neck travels into the shoulder, elbow and stops  at the fingers. Neck pain that radiates to both of his arms (R > L). Pt reports that he has been advised of bulging discs existing at multiple cervical levels. He has a hard time sleeping at night due to the pain. Aggravations include repetitive UE use, over head motions. Patient states that he currently takes oxycodone and tylenol for pain relief. 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 The reports that he had a discussion with referring provider regarding cervical surgery in order to reduce pain but is willing to try OPPT in order to maximize return to PLOF. The patient reports numbness and tingling starting from posterior shoulder referring distally to the 4th and 5th digits. He has noticed his grip strength in his R hand has started becoming weaker in comparison to LUE. Denies MOI, facial weakness, and visual changes.   Dominant hand: right  Imaging (Per Chart Review):  CT CERVICAL SPINE FINDINGS  Alignment: Normal cervical lordosis.   Skull base and vertebrae: No acute fracture. No primary bone lesion or focal pathologic process.   Soft tissues and spinal canal: No prevertebral fluid or swelling. No visible canal hematoma.   Disc levels: Status post PLIF at C6-7. Mild degenerative changes, most prominent at C5-6. Spinal canal is patent.   Upper chest: Visualized lung apices are clear.   Other: Visualized thyroid is unremarkable.    IMPRESSION: Normal head CT.   No traumatic injury to the cervical spine. Status post PLIF at C6-7. Mild degenerative changes.  Pain:  Pain Intensity: Present:4 /10, Best: 4/10, Worst: 10/10 Pain location: Bilateral Neck, Shoulder and Wrist (L > R)   Pain Quality: constant, tingling, and aching  Radiating: Yes  Numbness/Tingling: Yes Focal Weakness: No History of prior neck injury, pain, surgery, or therapy: No Red flags (personal history of cancer, h/o spinal tumors, history of compression fracture, chills/fever, night sweats, nausea, vomiting, unrelenting pain): Negative  PRECAUTIONS: Fall  WEIGHT BEARING RESTRICTIONS: No  FALLS: Has patient fallen in last 6 months? No  Living Environment Lives with: lives with their spouse Lives in: House/apartment Stairs: Yes: Internal: 14 steps; on left going up Has following equipment at home: None  Prior level of function: Independent  Occupational demands: Passenger transport manager   Hobbies: Watch TV, Hands-On Tasks, Drawing   Patient Goals: Reduce Pain, Improve Cervical ROM and increase grip strength.    OBJECTIVE:   Patient Surveys: NDI: Deferred to next session    Cognition Patient is oriented to person, place, and time.  Recent memory is intact.  Attention span and concentration are intact.  Expressive speech is intact.     Gross Musculoskeletal Assessment Tremor: None Bulk: Normal Tone: Normal  Gait Deferred   Posture Rounded Posture, Forward Posture   AROM AROM (Normal range in degrees) AROM  Cervical  Flexion (50) 40  Extension (80) WNL  Right lateral flexion (45) WNL *   Left lateral flexion (45) WNL   Right rotation (85) 40  Left rotation (85) 45  (* = pain; Blank rows = not tested)  UPPER EXTREMITY ROM:  Active ROM Right eval Left eval  Shoulder flexion 145 160  Shoulder extension    Shoulder abduction    Shoulder adduction    Shoulder extension    Shoulder internal rotation 60 *    Shoulder external rotation 50 *   Elbow flexion    Elbow extension    Wrist flexion    Wrist extension  Wrist ulnar deviation    Wrist radial deviation    Wrist pronation    Wrist supination    (* = pain; Blank rows = not tested)  UPPER EXTREMITY MMT:  MMT Right eval Left eval  Shoulder flexion 4+ *  5  Shoulder extension    Shoulder abduction 4/5 *  5  Shoulder adduction    Shoulder extension    Shoulder internal rotation    Shoulder external rotation    Middle trapezius    Lower trapezius    Elbow flexion 5 5  Elbow extension 5 5  Wrist flexion 5 5  Wrist extension 5 5  Wrist ulnar deviation    Wrist radial deviation    Wrist pronation    Wrist supination    Grip strength      (* = pain; Blank rows = not tested)  MMT MMT (out of 5) Right Left  Cervical (isometric)  Flexion WNL  Extension WNL  Lateral Flexion WNL WNL  Rotation WNL WNL      (* = pain; Blank rows = not tested)  Sensation Grossly intact with light touch bilaterally.  Reflexes Not Tested   Palpation Location LEFT  RIGHT           Suboccipitals 0 0  Cervical paraspinals 0 0  Upper Trapezius 0 0  Levator Scapulae 0 0  Rhomboid Major/Minor 0 0  (Blank rows = not tested) Graded on 0-4 scale (0 = no pain, 1 = pain, 2 = pain with wincing/grimacing/flinching, 3 = pain with withdrawal, 4 = unwilling to allow palpation), (Blank rows = not tested)  Repeated Movements No centralization or peripheralization of symptoms with repeated cervical retraction.   Passive Accessory Intervertebral Motion Not Tested   SPECIAL TESTS Spurlings A (ipsilateral lateral flexion/axial compression): R: Positive L: Not Tested  ULTT Radial R: Positive for Pain   Clinical Prediction Rules  Diagnostic Cervical Radiculopathy ULTTa: Any one of the following: A) symptom reproduction; B) side-to-side difference >10 degrees in elbow extension; or C) with regard to involved/painful side: ipsilateral neck lateral  flexion decreases symptoms and/or contralateral neck lateral flexion increases symptoms.  ROM: involved-side cervical rotation range of motion less than 60 degrees Distraction test: symptom reduction.  Spurling's A: symptom reproduction.   Number of Positive Criteria  Sensitivity  Specificity  Pos LR  Neg LR   Two  0.39 0.56 0.88 1.09   Three  0.39  0.94  6.1 0.65   Four  0.24 0.99  30.3 0.77   Pos LR = positive likelihood ratio. Neg LR = negative likelihood ratio.    TODAY'S TREATMENT   There.ex:  Reviewed HEP with Pt return demonstration:   Exercises - Seated Cervical Retraction  - 1 x daily - 7 x weekly - 3 sets - 10-12 reps - 5s hold - Shoulder External Rotation and Scapular Retraction with Resistance  - 1 x daily - 3-4 x weekly - 2-3 sets - 10-12 reps - Standing Radial Nerve Glide  - 1 x daily - 7 x weekly - 3 sets - 10-12 reps - Standing Shoulder Row with Anchored Resistance  - 1 x daily - 7 x weekly - 3 sets - 10-12 reps   PATIENT EDUCATION:  Education details: HEP, POC  Person educated: Patient Education method: Explanation, Demonstration, and Handouts Education comprehension: verbalized understanding, returned demonstration, and verbal cues required   HOME EXERCISE PROGRAM:   HEP: Access Code: [email or web address] URL: [email or web address] Date: 01/25/2024 Prepared by: Christene Lye  Exercises - Seated Cervical Retraction  - 1 x daily - 7 x weekly - 3 sets - 10-12 reps - 5s hold - Shoulder External Rotation and Scapular Retraction with Resistance  - 1 x daily - 3-4 x weekly - 2-3 sets - 10-12 reps - Standing Radial Nerve Glide  - 1 x daily - 7 x weekly - 3 sets - 10-12 reps - Standing Shoulder Row with Anchored Resistance  - 1 x daily - 7 x weekly - 3 sets - 10-12 reps  ASSESSMENT:  CLINICAL IMPRESSION: Patient is a 58 y.o. male who was seen today for physical therapy evaluation and treatment for cervical pain. Patient's cervical ROM grossly intact with  concordant pain in R lateral flexion. Cervical and Shoulder strength remains intact with notable weakness specifically in R shoulder abd and flex secondary to pain. ULTT Radial Nerve, Spurlings and Distraction test all positive indicating signs and symptoms consistent with cervical radiculopathy. Patient educated on initial HEP with good return demonstration today. He presented with rounded shoulders and forward head posture in sitting. These deficits are leading to poor sleep and difficulty with heavy lifting or other work related tasks. Pt will benefit from skilled PT services to address these deficits and maximize return to PLOF.   OBJECTIVE IMPAIRMENTS: decreased ROM, decreased strength, impaired sensation, and pain.   ACTIVITY LIMITATIONS: carrying, lifting, sleeping, and reach over head  PARTICIPATION LIMITATIONS: driving, occupation, and yard work  PERSONAL FACTORS: Age, Profession, and Time since onset of injury/illness/exacerbation are also affecting patient's functional outcome.   REHAB POTENTIAL: Fair Patient is motivated to maximize return to PLOF but he has had numerous ESI with little relief. Referring provider's impression on recent imaging suggest that osteophyte and degeneration of multiple vertebral spaces indicate surgical interventions.   CLINICAL DECISION MAKING: Evolving/moderate complexity  EVALUATION COMPLEXITY: Moderate   GOALS: Goals reviewed with patient? No  SHORT TERM GOALS: Target date: 03/07/2024  Pt will be independent with HEP to improve strength and decrease neck pain to improve pain-free function at home and work. Baseline: n/a Goal status: INITIAL   LONG TERM GOALS: Target date: 04/18/2024  Pt will improve right-hand grip strength by at least within 10% of the left hand's current grip strength via hand dynamometer in order to demonstrate improvements in hand strength and functional tasks such as gripping tools at work.  Baseline: Deferred to next  session Goal status: INITIAL  2.  Pt will decrease worst neck pain by at least 2 points on the NPRS in order to demonstrate clinically significant reduction in neck pain. Baseline: 01/25/2024: 10/10 Goal status: INITIAL  3.  Pt will decrease NDI score by at least 19% in order demonstrate clinically significant reduction in neck pain/disability.       Baseline: Deferred to next session  Goal status: INITIAL    PLAN: PT FREQUENCY: 1-2x/week  PT DURATION: 12 weeks  PLANNED INTERVENTIONS: Therapeutic exercises, Therapeutic activity, Neuromuscular re-education, Balance training, Gait training, Patient/Family education, Self Care, Joint mobilization, Joint manipulation, DME instructions, Dry Needling, Electrical stimulation, Spinal manipulation, Spinal mobilization, Cryotherapy, Moist heat, Taping, Traction, Ultrasound, Manual therapy, and Re-evaluation.  PLAN FOR NEXT SESSION: NDI, Review HEP, progress postural strengthening, cervical retractions exercises, manual therapy PRN   Christene Lye PT, DPT 01/25/2024, 10:18 PM

## 2024-01-30 ENCOUNTER — Ambulatory Visit

## 2024-02-01 ENCOUNTER — Ambulatory Visit

## 2024-02-01 DIAGNOSIS — M5412 Radiculopathy, cervical region: Secondary | ICD-10-CM | POA: Diagnosis not present

## 2024-02-01 DIAGNOSIS — M542 Cervicalgia: Secondary | ICD-10-CM

## 2024-02-01 NOTE — Therapy (Addendum)
 OUTPATIENT PHYSICAL THERAPY NECK TREATMENT   Patient Name: Eric Owen. MRN: [id number] DOB:01/02/1966, 58 y.o., male Today's Date: 02/01/2024  END OF SESSION:  PT End of Session - 02/01/24 1035     Visit Number 2    Number of Visits 25    Date for PT Re-Evaluation 04/18/24    Authorization Type [employer] [id number]    Authorization - Number of Visits 60    Progress Note Due on Visit 10    PT Start Time 1035    PT Stop Time 1115    PT Time Calculation (min) 40 min    Activity Tolerance Patient tolerated treatment well    Behavior During Therapy WFL for tasks assessed/performed             Past Medical History:  Diagnosis Date   Arthritis    Blood transfusion    GERD (gastroesophageal reflux disease)    Hypertension    Peptic ulcer    Past Surgical History:  Procedure Laterality Date   BUNIONECTOMY     CARPAL TUNNEL RELEASE     right   CERVICAL FUSION     COLONOSCOPY N/A 03/04/2015   Procedure: COLONOSCOPY;  Surgeon: Jerlean Mood, MD;  Location: [facility];  Service: Endoscopy;  Laterality: N/A;   HAMMER TOE SURGERY     POSTERIOR CERVICAL FUSION/FORAMINOTOMY  03/16/2012   Procedure: POSTERIOR CERVICAL FUSION/FORAMINOTOMY LEVEL 1;  Surgeon: Isadora Mar, MD;  Location: [facility];  Service: Neurosurgery;  Laterality: N/A;  Cervical six-seven posterior cervical fusion with lateral mass screws   Patient Active Problem List   Diagnosis Date Noted   Encounter for preventive care 09/14/2022   Type 2 diabetes mellitus without complication, without long-term current use of insulin (HCC) 08/12/2020   Mixed dyslipidemia 05/13/2020   Tobacco abuse counseling 05/07/2020   Essential hypertension    GERD (gastroesophageal reflux disease)    Arthritis     PCP: Theron Flavin, MD  REFERRING PROVIDER: Jeannette Mills, NP  REFERRING DIAG:  M54.12 (ICD-10-CM) - Radiculopathy, cervical region    RATIONALE FOR EVALUATION AND TREATMENT:  Rehabilitation  THERAPY DIAG: Radiculopathy, cervical region  Cervicalgia  ONSET DATE: Chronic Neck pain   FOLLOW-UP APPT SCHEDULED WITH REFERRING PROVIDER: Yes April 24th    SUBJECTIVE:                                                                                                                                                                                         Chief Complaint: Neck pain   Pertinent History Pt reports pain that starts in the neck travels into the shoulder, elbow and stops  at the fingers. Neck pain that radiates to both of his arms (R > L). Pt reports that he has been advised of bulging discs existing at multiple cervical levels. He has a hard time sleeping at night due to the pain. Aggravations include repetitive UE use, over head motions. Patient states that he currently takes oxycodone and tylenol for pain relief. 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 The reports that he had a discussion with referring provider regarding cervical surgery in order to reduce pain but is willing to try OPPT in order to maximize return to PLOF. The patient reports numbness and tingling starting from posterior shoulder referring distally to the 4th and 5th digits. He has noticed his grip strength in his R hand has started becoming weaker in comparison to LUE. Denies MOI, facial weakness, and visual changes.   Dominant hand: right  Imaging (Per Chart Review):  CT CERVICAL SPINE FINDINGS  Alignment: Normal cervical lordosis.   Skull base and vertebrae: No acute fracture. No primary bone lesion or focal pathologic process.   Soft tissues and spinal canal: No prevertebral fluid or swelling. No visible canal hematoma.   Disc levels: Status post PLIF at C6-7. Mild degenerative changes, most prominent at C5-6. Spinal canal is patent.   Upper chest: Visualized lung apices are clear.   Other: Visualized thyroid is unremarkable.    IMPRESSION: Normal head CT.   No traumatic injury to the cervical spine. Status post PLIF at C6-7. Mild degenerative changes.  Pain:  Pain Intensity: Present:4 /10, Best: 4/10, Worst: 10/10 Pain location: Bilateral Neck, Shoulder and Wrist (L > R)   Pain Quality: constant, tingling, and aching  Radiating: Yes  Numbness/Tingling: Yes Focal Weakness: No History of prior neck injury, pain, surgery, or therapy: No Red flags (personal history of cancer, h/o spinal tumors, history of compression fracture, chills/fever, night sweats, nausea, vomiting, unrelenting pain): Negative  PRECAUTIONS: Fall  WEIGHT BEARING RESTRICTIONS: No  FALLS: Has patient fallen in last 6 months? No  Living Environment Lives with: lives with their spouse Lives in: House/apartment Stairs: Yes: Internal: 14 steps; on left going up Has following equipment at home: None  Prior level of function: Independent  Occupational demands: Passenger transport manager   Hobbies: Watch TV, Hands-On Tasks, Drawing   Patient Goals: Reduce Pain, Improve Cervical ROM and increase grip strength.    OBJECTIVE:   Patient Surveys: NDI: Deferred to next session    Cognition Patient is oriented to person, place, and time.  Recent memory is intact.  Attention span and concentration are intact.  Expressive speech is intact.     Gross Musculoskeletal Assessment Tremor: None Bulk: Normal Tone: Normal  Gait Deferred   Posture Rounded Posture, Forward Posture   AROM AROM (Normal range in degrees) AROM  Cervical  Flexion (50) 40  Extension (80) WNL  Right lateral flexion (45) WNL *   Left lateral flexion (45) WNL   Right rotation (85) 40  Left rotation (85) 45  (* = pain; Blank rows = not tested)  UPPER EXTREMITY ROM:  Active ROM Right eval Left eval  Shoulder flexion 145 160  Shoulder extension    Shoulder abduction    Shoulder adduction    Shoulder extension    Shoulder internal rotation 60 *    Shoulder external rotation 50 *   Elbow flexion    Elbow extension    Wrist flexion    Wrist extension  Wrist ulnar deviation    Wrist radial deviation    Wrist pronation    Wrist supination    (* = pain; Blank rows = not tested)  UPPER EXTREMITY MMT:  MMT Right eval Left eval  Shoulder flexion 4+ *  5  Shoulder extension    Shoulder abduction 4/5 *  5  Shoulder adduction    Shoulder extension    Shoulder internal rotation    Shoulder external rotation    Middle trapezius    Lower trapezius    Elbow flexion 5 5  Elbow extension 5 5  Wrist flexion 5 5  Wrist extension 5 5  Wrist ulnar deviation    Wrist radial deviation    Wrist pronation    Wrist supination    Grip strength      (* = pain; Blank rows = not tested)  MMT MMT (out of 5) Right Left  Cervical (isometric)  Flexion WNL  Extension WNL  Lateral Flexion WNL WNL  Rotation WNL WNL      (* = pain; Blank rows = not tested)  Sensation Grossly intact with light touch bilaterally.  Reflexes Not Tested   Palpation Location LEFT  RIGHT           Suboccipitals 0 0  Cervical paraspinals 0 0  Upper Trapezius 0 0  Levator Scapulae 0 0  Rhomboid Major/Minor 0 0  (Blank rows = not tested) Graded on 0-4 scale (0 = no pain, 1 = pain, 2 = pain with wincing/grimacing/flinching, 3 = pain with withdrawal, 4 = unwilling to allow palpation), (Blank rows = not tested)  Repeated Movements No centralization or peripheralization of symptoms with repeated cervical retraction.   Passive Accessory Intervertebral Motion Not Tested   SPECIAL TESTS Spurlings A (ipsilateral lateral flexion/axial compression): R: Positive L: Not Tested  ULTT Radial R: Positive for Pain   Clinical Prediction Rules  Diagnostic Cervical Radiculopathy ULTTa: Any one of the following: A) symptom reproduction; B) side-to-side difference >10 degrees in elbow extension; or C) with regard to involved/painful side: ipsilateral neck lateral  flexion decreases symptoms and/or contralateral neck lateral flexion increases symptoms.  ROM: involved-side cervical rotation range of motion less than 60 degrees Distraction test: symptom reduction.  Spurling's A: symptom reproduction.   Number of Positive Criteria  Sensitivity  Specificity  Pos LR  Neg LR   Two  0.39 0.56 0.88 1.09   Three  0.39  0.94  6.1 0.65   Four  0.24 0.99  30.3 0.77   Pos LR = positive likelihood ratio. Neg LR = negative likelihood ratio.    TODAY'S TREATMENT:  Subjective: Patient feels relatively good today than the last 3 days (Saturday). Pt had donned a soft collar due to pain. Today is 4/10 in the R neck.   There.ex: Reviewed HEP with Pt return demonstration:   Shoulder External Rotation and Scapular Retraction with Resistance    1 x 10 Black TB  Standing Radial Nerve Glide:    1 x 10 with minimal relief in the neck   - PT VC for shoulder extension and lateral neck flexion   Standing Shoulder Row with Anchored Resistance     2 x 12 Black TB  Standing Shoulder Horizontal Abduction:    1 x 10 Blue   1 x 10 Black TTB   Supine Cervical Retraction against PT hands   2 x 10, 1st set with concordant, diminished with increased repetitions  Grip Strength Testing:   L:  90#  R: 105#  NDI (Filled out during rest breaks):  11 / 50 = 22.0 %  Manual Therapy (10 min):  STM to R Trapezius, levator scapulae and R parascapular muscles for pain modulation and to reduce muscle tension.   L Side Bending Stretch (R Trapezius):   30/bout x 3  L Side bending Stretch with PT OP at R Shoulder:   30s/bout x 3   PATIENT EDUCATION:  Education details: HEP, POC  Person educated: Patient Education method: Explanation, Demonstration, and Handouts Education comprehension: verbalized understanding, returned demonstration, and verbal cues required   HOME EXERCISE PROGRAM:   Access Code: [email or web address] URL: [email or web address] Date:  02/01/2024 Prepared by: Veryl Gottron Danilo Cappiello  Exercises - Seated Cervical Retraction  - 1 x daily - 7 x weekly - 3 sets - 10-12 reps - 5s hold - Shoulder External Rotation and Scapular Retraction with Resistance  - 1 x daily - 3-4 x weekly - 2-3 sets - 10-12 reps - Standing Radial Nerve Glide  - 1 x daily - 7 x weekly - 3 sets - 10-12 reps - Standing Shoulder Row with Anchored Resistance  - 1 x daily - 7 x weekly - 3 sets - 10-12 reps - Seated Upper Trapezius Stretch  - 1 x daily - 7 x weekly - 3 sets - 30-60 hold - Seated Levator Scapulae Stretch  - 1 x daily - 7 x weekly - 3 sets - 30-60 hold  HEP: Access Code: [email or web address] URL: [email or web address] Date: 01/25/2024 Prepared by: Veryl Gottron Deniel Mcquiston  Exercises - Seated Cervical Retraction  - 1 x daily - 7 x weekly - 3 sets - 10-12 reps - 5s hold - Shoulder External Rotation and Scapular Retraction with Resistance  - 1 x daily - 3-4 x weekly - 2-3 sets - 10-12 reps - Standing Radial Nerve Glide  - 1 x daily - 7 x weekly - 3 sets - 10-12 reps - Standing Shoulder Row with Anchored Resistance  - 1 x daily - 7 x weekly - 3 sets - 10-12 reps  ASSESSMENT:  CLINICAL IMPRESSION: Pt arrived to OPPT for first follow up visit on improving cervical pain and postural strength. Pt tolerated all parascapular exercises without additional report of pain; intermittent verbal cues for upright posture throughout session. Notable discomfort with cervical retractions with radiating pain into shoulder; improved with increased repetitions. Pt's L hand with notable weaker grip strength when compared to R (15% difference). Time spent on manual techniques in order to reduce pain and tension reported at beginning of session. Pt scored 22% on NDI today indicating moderate disability due to the pain in the neck. Pt with good carryover on HEP exercises from evaluation. Currently his deficits continue to disrupt sleep and full participation with heavy lifting on work  related tasks. Pt will benefit from continued skilled PT services to address these deficits and maximize return to PLOF.  OBJECTIVE IMPAIRMENTS: decreased ROM, decreased strength, impaired sensation, and pain.   ACTIVITY LIMITATIONS: carrying, lifting, sleeping, and reach over head  PARTICIPATION LIMITATIONS: driving, occupation, and yard work  PERSONAL FACTORS: Age, Profession, and Time since onset of injury/illness/exacerbation are also affecting patient's functional outcome.   REHAB POTENTIAL: Fair Patient is motivated to maximize return to PLOF but he has had numerous ESI with little relief. Referring provider's impression on recent imaging suggest that osteophyte and degeneration of multiple vertebral spaces indicate surgical interventions.   CLINICAL DECISION MAKING: Evolving/moderate complexity  EVALUATION COMPLEXITY: Moderate   GOALS: Goals reviewed with  patient? No  SHORT TERM GOALS: Target date: 03/14/2024  Pt will be independent with HEP to improve strength and decrease neck pain to improve pain-free function at home and work. Baseline: n/a Goal status: INITIAL   LONG TERM GOALS: Target date: 04/25/2024  Pt will improve right-hand grip strength by at least within 10% of the left hand's current grip strength via hand dynamometer in order to demonstrate improvements in hand strength and functional tasks such as gripping tools at work.  Baseline: n/a  Goal status: Deferred  2.  Pt will decrease worst neck pain by at least 2 points on the NPRS in order to demonstrate clinically significant reduction in neck pain. Baseline: 01/25/2024: 10/10 Goal status: INITIAL  3.  Pt will decrease NDI score by at least 19% in order demonstrate clinically significant reduction in neck pain/disability.       Baseline: 11 / 50 = 22.0 % Goal status: INITIAL  4. Pt will report improvements in sleep ( > 6 hours) with minimal pain ( < 3/10 NPS) and/or less sleep disruptions (< 2-3) in order to  demonstrate clinically significant reduction in neck pain.  Baseline:  Goal status: INITIAL      PLAN: PT FREQUENCY: 1-2x/week  PT DURATION: 12 weeks  PLANNED INTERVENTIONS: Therapeutic exercises, Therapeutic activity, Neuromuscular re-education, Balance training, Gait training, Patient/Family education, Self Care, Joint mobilization, Joint manipulation, DME instructions, Dry Needling, Electrical stimulation, Spinal manipulation, Spinal mobilization, Cryotherapy, Moist heat, Taping, Traction, Ultrasound, Manual therapy, and Re-evaluation.  PLAN FOR NEXT SESSION:  progress postural strengthening, cervical retractions exercises, manual therapy PRN   Satira Curet PT, DPT 02/01/2024, 12:52 PM

## 2024-02-03 ENCOUNTER — Ambulatory Visit

## 2024-02-03 DIAGNOSIS — M5412 Radiculopathy, cervical region: Secondary | ICD-10-CM

## 2024-02-03 DIAGNOSIS — M542 Cervicalgia: Secondary | ICD-10-CM

## 2024-02-03 NOTE — Therapy (Signed)
 OUTPATIENT PHYSICAL THERAPY NECK TREATMENT   Patient Name: Eric Owen. MRN: [id number] DOB:[birth year], 58 y.o., male Today's Date: 02/03/2024  END OF SESSION:  PT End of Session - 02/03/24 1035     Visit Number 3    Number of Visits 25    Date for PT Re-Evaluation 04/18/24    Authorization Type [id number]    Authorization - Number of Visits 60    Progress Note Due on Visit 10    PT Start Time 1035    PT Stop Time 1115    PT Time Calculation (min) 40 min    Activity Tolerance Patient tolerated treatment well    Behavior During Therapy WFL for tasks assessed/performed              Past Medical History:  Diagnosis Date   Arthritis    Blood transfusion    GERD (gastroesophageal reflux disease)    Hypertension    Peptic ulcer    Past Surgical History:  Procedure Laterality Date   BUNIONECTOMY     CARPAL TUNNEL RELEASE     right   CERVICAL FUSION     COLONOSCOPY N/A 03/04/2015   Procedure: COLONOSCOPY;  Surgeon: Louanne KANDICE Muse, MD;  Location: [facility];  Service: Endoscopy;  Laterality: N/A;   HAMMER TOE SURGERY     POSTERIOR CERVICAL FUSION/FORAMINOTOMY  03/16/2012   Procedure: POSTERIOR CERVICAL FUSION/FORAMINOTOMY LEVEL 1;  Surgeon: Alm GORMAN Molt, MD;  Location: [facility];  Service: Neurosurgery;  Laterality: N/A;  Cervical six-seven posterior cervical fusion with lateral mass screws   Patient Active Problem List   Diagnosis Date Noted   Encounter for preventive care 09/14/2022   Type 2 diabetes mellitus without complication, without long-term current use of insulin  (HCC) 08/12/2020   Mixed dyslipidemia 05/13/2020   Tobacco abuse counseling 05/07/2020   Essential hypertension    GERD (gastroesophageal reflux disease)    Arthritis     PCP: Britta King, MD  REFERRING PROVIDER: Orlean Suzen Lacks, NP  REFERRING DIAG:  M54.12 (ICD-10-CM) - Radiculopathy, cervical region    RATIONALE FOR EVALUATION AND TREATMENT:  Rehabilitation  THERAPY DIAG: Radiculopathy, cervical region  Cervicalgia  ONSET DATE: Chronic Neck pain   FOLLOW-UP APPT SCHEDULED WITH REFERRING PROVIDER: Yes April 24th    SUBJECTIVE:                                                                                                                                                                                         Chief Complaint: Neck pain   Pertinent History Pt reports pain that starts in the neck travels into the shoulder, elbow and  stops at the fingers. Neck pain that radiates to both of his arms (R > L). Pt reports that he has been advised of bulging discs existing at multiple cervical levels. He has a hard time sleeping at night due to the pain. Aggravations include repetitive UE use, over head motions. Patient states that he currently takes oxycodone  and tylenol  for pain relief. 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 The reports that he had a discussion with referring provider regarding cervical surgery in order to reduce pain but is willing to try OPPT in order to maximize return to PLOF. The patient reports numbness and tingling starting from posterior shoulder referring distally to the 4th and 5th digits. He has noticed his grip strength in his R hand has started becoming weaker in comparison to LUE. Denies MOI, facial weakness, and visual changes.   Dominant hand: right  Imaging (Per Chart Review):  CT CERVICAL SPINE FINDINGS  Alignment: Normal cervical lordosis.   Skull base and vertebrae: No acute fracture. No primary bone lesion or focal pathologic process.   Soft tissues and spinal canal: No prevertebral fluid or swelling. No visible canal hematoma.   Disc levels: Status post PLIF at C6-7. Mild degenerative changes, most prominent at C5-6. Spinal canal is patent.   Upper chest: Visualized lung apices are clear.   Other: Visualized thyroid  is unremarkable.    IMPRESSION: Normal head CT.   No traumatic injury to the cervical spine. Status post PLIF at C6-7. Mild degenerative changes.  Pain:  Pain Intensity: Present:4 /10, Best: 4/10, Worst: 10/10 Pain location: Bilateral Neck, Shoulder and Wrist (L > R)   Pain Quality: constant, tingling, and aching  Radiating: Yes  Numbness/Tingling: Yes Focal Weakness: No History of prior neck injury, pain, surgery, or therapy: No Red flags (personal history of cancer, h/o spinal tumors, history of compression fracture, chills/fever, night sweats, nausea, vomiting, unrelenting pain): Negative  PRECAUTIONS: Fall  WEIGHT BEARING RESTRICTIONS: No  FALLS: Has patient fallen in last 6 months? No  Living Environment Lives with: lives with their spouse Lives in: House/apartment Stairs: Yes: Internal: 14 steps; on left going up Has following equipment at home: None  Prior level of function: Independent  Occupational demands: Passenger Transport Manager   Hobbies: Watch TV, Hands-On Tasks, Drawing   Patient Goals: Reduce Pain, Improve Cervical ROM and increase grip strength.    OBJECTIVE:   Patient Surveys: NDI: Deferred to next session    Cognition Patient is oriented to person, place, and time.  Recent memory is intact.  Attention span and concentration are intact.  Expressive speech is intact.     Gross Musculoskeletal Assessment Tremor: None Bulk: Normal Tone: Normal  Gait Deferred   Posture Rounded Posture, Forward Posture   AROM AROM (Normal range in degrees) AROM  Cervical  Flexion (50) 40  Extension (80) WNL  Right lateral flexion (45) WNL *   Left lateral flexion (45) WNL   Right rotation (85) 40  Left rotation (85) 45  (* = pain; Blank rows = not tested)  UPPER EXTREMITY ROM:  Active ROM Right eval Left eval  Shoulder flexion 145 160  Shoulder extension    Shoulder abduction    Shoulder adduction    Shoulder extension    Shoulder internal rotation 60 *    Shoulder external rotation 50 *   Elbow flexion    Elbow extension    Wrist flexion    Wrist extension  Wrist ulnar deviation    Wrist radial deviation    Wrist pronation    Wrist supination    (* = pain; Blank rows = not tested)  UPPER EXTREMITY MMT:  MMT Right eval Left eval  Shoulder flexion 4+ *  5  Shoulder extension    Shoulder abduction 4/5 *  5  Shoulder adduction    Shoulder extension    Shoulder internal rotation    Shoulder external rotation    Middle trapezius    Lower trapezius    Elbow flexion 5 5  Elbow extension 5 5  Wrist flexion 5 5  Wrist extension 5 5  Wrist ulnar deviation    Wrist radial deviation    Wrist pronation    Wrist supination    Grip strength      (* = pain; Blank rows = not tested)  MMT MMT (out of 5) Right Left  Cervical (isometric)  Flexion WNL  Extension WNL  Lateral Flexion WNL WNL  Rotation WNL WNL      (* = pain; Blank rows = not tested)  Sensation Grossly intact with light touch bilaterally.  Reflexes Not Tested   Palpation Location LEFT  RIGHT           Suboccipitals 0 0  Cervical paraspinals 0 0  Upper Trapezius 0 0  Levator Scapulae 0 0  Rhomboid Major/Minor 0 0  (Blank rows = not tested) Graded on 0-4 scale (0 = no pain, 1 = pain, 2 = pain with wincing/grimacing/flinching, 3 = pain with withdrawal, 4 = unwilling to allow palpation), (Blank rows = not tested)  Repeated Movements No centralization or peripheralization of symptoms with repeated cervical retraction.   Passive Accessory Intervertebral Motion Not Tested   SPECIAL TESTS Spurlings A (ipsilateral lateral flexion/axial compression): R: Positive L: Not Tested  ULTT Radial R: Positive for Pain   Clinical Prediction Rules  Diagnostic Cervical Radiculopathy ULTTa: Any one of the following: A) symptom reproduction; B) side-to-side difference >10 degrees in elbow extension; or C) with regard to involved/painful side: ipsilateral neck lateral  flexion decreases symptoms and/or contralateral neck lateral flexion increases symptoms.  ROM: involved-side cervical rotation range of motion less than 60 degrees Distraction test: symptom reduction.  Spurling's A: symptom reproduction.   Number of Positive Criteria  Sensitivity  Specificity  Pos LR  Neg LR   Two  0.39 0.56 0.88 1.09   Three  0.39  0.94  6.1 0.65   Four  0.24 0.99  30.3 0.77   Pos LR = positive likelihood ratio. Neg LR = negative likelihood ratio.    TODAY'S TREATMENT:  02/03/2024    Subjective: Neck is ok today, not as bad, still a little tinge on the R posterior lateral neck, 3/10 currently. The exercises from last session were pretty good. Going to [facility] this weekend.    There.ex:  Supine chin tuck with pressing tongue at roof of mouth 10x5 seconds   Then with B scapular retraction 10x5 seconds for 2 sets  Supine B shoulder flexion 10x5 seconds for 3 sets to promote thoracic extension and decrease stress to his neck  Seated first rib stretch with strap   With cervical R and L rotation    R 10x3. Decreased R posterior lateral cervical discomfort with R and L cervical rotation afterwards   L 10x3  With occipital float Supine cervical rotation with slight cervical nod 10x2 each direction. Pt assist to decrease R cervical side bend with rotating  to the R from the end range L rotation position   Seated cervical rotation with chin tuck ( to decrease extension stress to C5/C6 area) and B scapular retraction 10x each direction   Improved exercise technique, movement at target joints, use of target muscles after mod verbal, visual, tactile cues.        PATIENT EDUCATION:  Education details: HEP, POC  Person educated: Patient Education method: Explanation, Demonstration, and Handouts Education comprehension: verbalized understanding, returned demonstration, and verbal cues required   HOME EXERCISE PROGRAM:   Access Code: [email or web address] URL:  [email or web address] Date: 02/01/2024 Prepared by: Lonni Go  Exercises - Seated Cervical Retraction  - 1 x daily - 7 x weekly - 3 sets - 10-12 reps - 5s hold - Shoulder External Rotation and Scapular Retraction with Resistance  - 1 x daily - 3-4 x weekly - 2-3 sets - 10-12 reps - Standing Radial Nerve Glide  - 1 x daily - 7 x weekly - 3 sets - 10-12 reps - Standing Shoulder Row with Anchored Resistance  - 1 x daily - 7 x weekly - 3 sets - 10-12 reps - Seated Upper Trapezius Stretch  - 1 x daily - 7 x weekly - 3 sets - 30-60 hold - Seated Levator Scapulae Stretch  - 1 x daily - 7 x weekly - 3 sets - 30-60 hold  HEP: Access Code: [email or web address] URL: [email or web address] Date: 01/25/2024 Prepared by: Lonni Go  Exercises - Seated Cervical Retraction  - 1 x daily - 7 x weekly - 3 sets - 10-12 reps - 5s hold - Shoulder External Rotation and Scapular Retraction with Resistance  - 1 x daily - 3-4 x weekly - 2-3 sets - 10-12 reps - Standing Radial Nerve Glide  - 1 x daily - 7 x weekly - 3 sets - 10-12 reps - Standing Shoulder Row with Anchored Resistance  - 1 x daily - 7 x weekly - 3 sets - 10-12 reps  ASSESSMENT:  CLINICAL IMPRESSION:  Worked on anterior cervical strengthening, thoracic extension, B scapular retraction and decreasing R and L scalene/levator scapular tightness to decrease stress to his neck. Improved comfort level with R and L cervical rotation in sitting with cues for cervical retraction to decrease extension stress to C5/C6 area. Pt tolerated session well without aggravation of symptoms. PT will benefit from continued skilled physical therapy services to decrease pain, improve strength and return to PLOF.     OBJECTIVE IMPAIRMENTS: decreased ROM, decreased strength, impaired sensation, and pain.   ACTIVITY LIMITATIONS: carrying, lifting, sleeping, and reach over head  PARTICIPATION LIMITATIONS: driving, occupation, and yard  work  PERSONAL FACTORS: Age, Profession, and Time since onset of injury/illness/exacerbation are also affecting patient's functional outcome.   REHAB POTENTIAL: Fair Patient is motivated to maximize return to PLOF but he has had numerous ESI with little relief. Referring provider's impression on recent imaging suggest that osteophyte and degeneration of multiple vertebral spaces indicate surgical interventions.   CLINICAL DECISION MAKING: Evolving/moderate complexity  EVALUATION COMPLEXITY: Moderate   GOALS: Goals reviewed with patient? No  SHORT TERM GOALS: Target date: 03/16/2024  Pt will be independent with HEP to improve strength and decrease neck pain to improve pain-free function at home and work. Baseline: n/a Goal status: INITIAL   LONG TERM GOALS: Target date: 04/27/2024  Pt will improve right-hand grip strength by at least within 10% of the left hand's current grip strength via hand dynamometer in order to demonstrate improvements in hand strength and  functional tasks such as gripping tools at work.  Baseline: n/a  Goal status: Deferred  2.  Pt will decrease worst neck pain by at least 2 points on the NPRS in order to demonstrate clinically significant reduction in neck pain. Baseline: 01/25/2024: 10/10 Goal status: INITIAL  3.  Pt will decrease NDI score by at least 19% in order demonstrate clinically significant reduction in neck pain/disability.       Baseline: 11 / 50 = 22.0 % Goal status: INITIAL  4. Pt will report improvements in sleep ( > 6 hours) with minimal pain ( < 3/10 NPS) and/or less sleep disruptions (< 2-3) in order to demonstrate clinically significant reduction in neck pain.  Baseline:  Goal status: INITIAL      PLAN: PT FREQUENCY: 1-2x/week  PT DURATION: 12 weeks  PLANNED INTERVENTIONS: Therapeutic exercises, Therapeutic activity, Neuromuscular re-education, Balance training, Gait training, Patient/Family education, Self Care, Joint  mobilization, Joint manipulation, DME instructions, Dry Needling, Electrical stimulation, Spinal manipulation, Spinal mobilization, Cryotherapy, Moist heat, Taping, Traction, Ultrasound, Manual therapy, and Re-evaluation.  PLAN FOR NEXT SESSION:  progress postural strengthening, cervical retractions exercises, manual therapy PRN   Lonni Pall PT, DPT 02/03/2024, 11:19 AM

## 2024-02-06 ENCOUNTER — Ambulatory Visit

## 2024-02-07 ENCOUNTER — Encounter

## 2024-02-09 ENCOUNTER — Encounter

## 2024-02-14 ENCOUNTER — Ambulatory Visit

## 2024-02-14 ENCOUNTER — Encounter

## 2024-02-14 DIAGNOSIS — M5412 Radiculopathy, cervical region: Secondary | ICD-10-CM

## 2024-02-14 DIAGNOSIS — M542 Cervicalgia: Secondary | ICD-10-CM

## 2024-02-14 NOTE — Therapy (Signed)
 OUTPATIENT PHYSICAL THERAPY NECK TREATMENT   Patient Name: Eric Owen. MRN: [id number] DOB:1966-02-21, 59 y.o., male Today's Date: 02/14/2024  END OF SESSION:  PT End of Session - 02/14/24 0820     Visit Number 4    Number of Visits 25    Date for PT Re-Evaluation 04/18/24    Authorization Type [employer] [id number]    Authorization - Number of Visits 60    Progress Note Due on Visit 10    PT Start Time 0820    PT Stop Time 0858    PT Time Calculation (min) 38 min    Activity Tolerance Patient tolerated treatment well    Behavior During Therapy WFL for tasks assessed/performed               Past Medical History:  Diagnosis Date   Arthritis    Blood transfusion    GERD (gastroesophageal reflux disease)    Hypertension    Peptic ulcer    Past Surgical History:  Procedure Laterality Date   BUNIONECTOMY     CARPAL TUNNEL RELEASE     right   CERVICAL FUSION     COLONOSCOPY N/A 03/04/2015   Procedure: COLONOSCOPY;  Surgeon: Jerlean Mood, MD;  Location: [facility];  Service: Endoscopy;  Laterality: N/A;   HAMMER TOE SURGERY     POSTERIOR CERVICAL FUSION/FORAMINOTOMY  03/16/2012   Procedure: POSTERIOR CERVICAL FUSION/FORAMINOTOMY LEVEL 1;  Surgeon: Isadora Mar, MD;  Location: [facility];  Service: Neurosurgery;  Laterality: N/A;  Cervical six-seven posterior cervical fusion with lateral mass screws   Patient Active Problem List   Diagnosis Date Noted   Encounter for preventive care 09/14/2022   Type 2 diabetes mellitus without complication, without long-term current use of insulin (HCC) 08/12/2020   Mixed dyslipidemia 05/13/2020   Tobacco abuse counseling 05/07/2020   Essential hypertension    GERD (gastroesophageal reflux disease)    Arthritis     PCP: Theron Flavin, MD  REFERRING PROVIDER: Jeannette Mills, NP  REFERRING DIAG:  M54.12 (ICD-10-CM) - Radiculopathy, cervical region    RATIONALE FOR EVALUATION AND TREATMENT:  Rehabilitation  THERAPY DIAG: Radiculopathy, cervical region  Cervicalgia  ONSET DATE: Chronic Neck pain   FOLLOW-UP APPT SCHEDULED WITH REFERRING PROVIDER: Yes April 24th    SUBJECTIVE:                                                                                                                                                                                         Chief Complaint: Neck pain   Pertinent History Pt reports pain that starts in the neck travels into the shoulder, elbow  and stops at the fingers. Neck pain that radiates to both of his arms (R > L). Pt reports that he has been advised of bulging discs existing at multiple cervical levels. He has a hard time sleeping at night due to the pain. Aggravations include repetitive UE use, over head motions. Patient states that he currently takes oxycodone and tylenol  for pain relief. 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 The reports that he had a discussion with referring provider regarding cervical surgery in order to reduce pain but is willing to try OPPT in order to maximize return to PLOF. The patient reports numbness and tingling starting from posterior shoulder referring distally to the 4th and 5th digits. He has noticed his grip strength in his R hand has started becoming weaker in comparison to LUE. Denies MOI, facial weakness, and visual changes.   Dominant hand: right  Imaging (Per Chart Review):  CT CERVICAL SPINE FINDINGS  Alignment: Normal cervical lordosis.   Skull base and vertebrae: No acute fracture. No primary bone lesion or focal pathologic process.   Soft tissues and spinal canal: No prevertebral fluid or swelling. No visible canal hematoma.   Disc levels: Status post PLIF at C6-7. Mild degenerative changes, most prominent at C5-6. Spinal canal is patent.   Upper chest: Visualized lung apices are clear.   Other: Visualized thyroid  is unremarkable.    IMPRESSION: Normal head CT.   No traumatic injury to the cervical spine. Status post PLIF at C6-7. Mild degenerative changes.  Pain:  Pain Intensity: Present:4 /10, Best: 4/10, Worst: 10/10 Pain location: Bilateral Neck, Shoulder and Wrist (L > R)   Pain Quality: constant, tingling, and aching  Radiating: Yes  Numbness/Tingling: Yes Focal Weakness: No History of prior neck injury, pain, surgery, or therapy: No Red flags (personal history of cancer, h/o spinal tumors, history of compression fracture, chills/fever, night sweats, nausea, vomiting, unrelenting pain): Negative  PRECAUTIONS: Fall  WEIGHT BEARING RESTRICTIONS: No  FALLS: Has patient fallen in last 6 months? No  Living Environment Lives with: lives with their spouse Lives in: House/apartment Stairs: Yes: Internal: 14 steps; on left going up Has following equipment at home: None  Prior level of function: Independent  Occupational demands: Passenger transport manager   Hobbies: Watch TV, Hands-On Tasks, Drawing   Patient Goals: Reduce Pain, Improve Cervical ROM and increase grip strength.    OBJECTIVE:   Patient Surveys: NDI: Deferred to next session    Cognition Patient is oriented to person, place, and time.  Recent memory is intact.  Attention span and concentration are intact.  Expressive speech is intact.     Gross Musculoskeletal Assessment Tremor: None Bulk: Normal Tone: Normal  Gait Deferred   Posture Rounded Posture, Forward Posture   AROM AROM (Normal range in degrees) AROM  Cervical  Flexion (50) 40  Extension (80) WNL  Right lateral flexion (45) WNL *   Left lateral flexion (45) WNL   Right rotation (85) 40  Left rotation (85) 45  (* = pain; Blank rows = not tested)  UPPER EXTREMITY ROM:  Active ROM Right eval Left eval  Shoulder flexion 145 160  Shoulder extension    Shoulder abduction    Shoulder adduction    Shoulder extension    Shoulder internal rotation 60 *    Shoulder external rotation 50 *   Elbow flexion    Elbow extension    Wrist flexion    Wrist extension  Wrist ulnar deviation    Wrist radial deviation    Wrist pronation    Wrist supination    (* = pain; Blank rows = not tested)  UPPER EXTREMITY MMT:  MMT Right eval Left eval  Shoulder flexion 4+ *  5  Shoulder extension    Shoulder abduction 4/5 *  5  Shoulder adduction    Shoulder extension    Shoulder internal rotation    Shoulder external rotation    Middle trapezius    Lower trapezius    Elbow flexion 5 5  Elbow extension 5 5  Wrist flexion 5 5  Wrist extension 5 5  Wrist ulnar deviation    Wrist radial deviation    Wrist pronation    Wrist supination    Grip strength      (* = pain; Blank rows = not tested)  MMT MMT (out of 5) Right Left  Cervical (isometric)  Flexion WNL  Extension WNL  Lateral Flexion WNL WNL  Rotation WNL WNL      (* = pain; Blank rows = not tested)  Sensation Grossly intact with light touch bilaterally.  Reflexes Not Tested   Palpation Location LEFT  RIGHT           Suboccipitals 0 0  Cervical paraspinals 0 0  Upper Trapezius 0 0  Levator Scapulae 0 0  Rhomboid Major/Minor 0 0  (Blank rows = not tested) Graded on 0-4 scale (0 = no pain, 1 = pain, 2 = pain with wincing/grimacing/flinching, 3 = pain with withdrawal, 4 = unwilling to allow palpation), (Blank rows = not tested)  Repeated Movements No centralization or peripheralization of symptoms with repeated cervical retraction.   Passive Accessory Intervertebral Motion Not Tested   SPECIAL TESTS Spurlings A (ipsilateral lateral flexion/axial compression): R: Positive L: Not Tested  ULTT Radial R: Positive for Pain   Clinical Prediction Rules  Diagnostic Cervical Radiculopathy ULTTa: Any one of the following: A) symptom reproduction; B) side-to-side difference >10 degrees in elbow extension; or C) with regard to involved/painful side: ipsilateral neck lateral  flexion decreases symptoms and/or contralateral neck lateral flexion increases symptoms.  ROM: involved-side cervical rotation range of motion less than 60 degrees Distraction test: symptom reduction.  Spurling's A: symptom reproduction.   Number of Positive Criteria  Sensitivity  Specificity  Pos LR  Neg LR   Two  0.39 0.56 0.88 1.09   Three  0.39  0.94  6.1 0.65   Four  0.24 0.99  30.3 0.77   Pos LR = positive likelihood ratio. Neg LR = negative likelihood ratio.    TODAY'S TREATMENT:  02/14/2024    Subjective: Neck is good today. No pain currently. 2-3/10 neck pain at most for the past 7 days.  Still has a difficult time with his grip    There.ex:   Seated chin tuck with pressing tongue at roof of mouth 10x5 seconds   Then with B scapular retraction 10x5 seconds for 2 sets  Seated thoracic extension at chair 10x5 seconds for 3 sets  Seated press-ups on chair 10x5 seconds   Seated first rib stretch with strap   With cervical R and L rotation    R 10x3. Decreased R posterior lateral cervical discomfort with R and L cervical rotation afterwards   L 10x3  Seated press-ups on chair 7x5 seconds   L shoulder joint discomfort.   Standing B shoulder extension with scapular retraction red band 10x5 seconds red band for 3 sets  Shoulder External Rotation and Scapular Retraction with Resistance                      2 x 10 Black TB   Seated cervical rotation with chin tuck ( to decrease extension stress to C5/C6 area) and B scapular retraction 10x3 each direction      Improved exercise technique, movement at target joints, use of target muscles after mod verbal, visual, tactile cues.        PATIENT EDUCATION:  Education details: HEP, POC  Person educated: Patient Education method: Explanation, Demonstration, and Handouts Education comprehension: verbalized understanding, returned demonstration, and verbal cues required   HOME EXERCISE PROGRAM:     02/14/2024 Drawing handout [facility])  Seated first rib stretch with strap   With cervical R and L rotation    R 10x3. Decreased R posterior lateral cervical discomfort with R and L cervical rotation afterwards   L 10x3    Access Code: [email or web address] URL: [email or web address] Date: 02/01/2024 Prepared by: Veryl Gottron Go  Exercises - Seated Cervical Retraction  - 1 x daily - 7 x weekly - 3 sets - 10-12 reps - 5s hold - Shoulder External Rotation and Scapular Retraction with Resistance  - 1 x daily - 3-4 x weekly - 2-3 sets - 10-12 reps - Standing Radial Nerve Glide  - 1 x daily - 7 x weekly - 3 sets - 10-12 reps - Standing Shoulder Row with Anchored Resistance  - 1 x daily - 7 x weekly - 3 sets - 10-12 reps - Seated Upper Trapezius Stretch  - 1 x daily - 7 x weekly - 3 sets - 30-60 hold - Seated Levator Scapulae Stretch  - 1 x daily - 7 x weekly - 3 sets - 30-60 hold    HEP: Access Code: [email or web address] URL: [email or web address] Date: 01/25/2024 Prepared by: Veryl Gottron Go  Exercises - Seated Cervical Retraction  - 1 x daily - 7 x weekly - 3 sets - 10-12 reps - 5s hold - Shoulder External Rotation and Scapular Retraction with Resistance  - 1 x daily - 3-4 x weekly - 2-3 sets - 10-12 reps - Standing Radial Nerve Glide  - 1 x daily - 7 x weekly - 3 sets - 10-12 reps - Standing Shoulder Row with Anchored Resistance  - 1 x daily - 7 x weekly - 3 sets - 10-12 reps  ASSESSMENT:  CLINICAL IMPRESSION:  Decreasing neck pain; still demonstrates difficulty with gripping based on subjective reports. Continued working on improving cervical posture, anterior cervical muscles and scapular strengthening, as well as decreasing upper trap and scalene muscle tension to his neck. Pt tolerated session well without aggravation of symptoms. Pt will benefit from continued skilled physical therapy services to decrease pain, improve strength and function.      OBJECTIVE  IMPAIRMENTS: decreased ROM, decreased strength, impaired sensation, and pain.   ACTIVITY LIMITATIONS: carrying, lifting, sleeping, and reach over head  PARTICIPATION LIMITATIONS: driving, occupation, and yard work  PERSONAL FACTORS: Age, Profession, and Time since onset of injury/illness/exacerbation are also affecting patient's functional outcome.   REHAB POTENTIAL: Fair Patient is motivated to maximize return to PLOF but he has had numerous ESI with little relief. Referring provider's impression on recent imaging suggest that osteophyte and degeneration of multiple vertebral spaces indicate surgical interventions.   CLINICAL DECISION MAKING: Evolving/moderate complexity  EVALUATION COMPLEXITY: Moderate   GOALS: Goals reviewed with patient? No  SHORT TERM GOALS: Target date: 03/27/2024  Pt  will be independent with HEP to improve strength and decrease neck pain to improve pain-free function at home and work. Baseline: n/a Goal status: INITIAL   LONG TERM GOALS: Target date: 05/08/2024  Pt will improve right-hand grip strength by at least within 10% of the left hand's current grip strength via hand dynamometer in order to demonstrate improvements in hand strength and functional tasks such as gripping tools at work.  Baseline: n/a  Goal status: Deferred  2.  Pt will decrease worst neck pain by at least 2 points on the NPRS in order to demonstrate clinically significant reduction in neck pain. Baseline: 01/25/2024: 10/10 Goal status: INITIAL  3.  Pt will decrease NDI score by at least 19% in order demonstrate clinically significant reduction in neck pain/disability.       Baseline: 11 / 50 = 22.0 % Goal status: INITIAL  4. Pt will report improvements in sleep ( > 6 hours) with minimal pain ( < 3/10 NPS) and/or less sleep disruptions (< 2-3) in order to demonstrate clinically significant reduction in neck pain.  Baseline:  Goal status: INITIAL      PLAN: PT FREQUENCY:  1-2x/week  PT DURATION: 12 weeks  PLANNED INTERVENTIONS: Therapeutic exercises, Therapeutic activity, Neuromuscular re-education, Balance training, Gait training, Patient/Family education, Self Care, Joint mobilization, Joint manipulation, DME instructions, Dry Needling, Electrical stimulation, Spinal manipulation, Spinal mobilization, Cryotherapy, Moist heat, Taping, Traction, Ultrasound, Manual therapy, and Re-evaluation.  PLAN FOR NEXT SESSION:  progress postural strengthening, cervical retractions exercises, manual therapy PRN  Garwood Wentzell PT, DPT  02/14/2024, 11:53 AM

## 2024-02-16 ENCOUNTER — Encounter

## 2024-02-17 ENCOUNTER — Ambulatory Visit: Attending: Student

## 2024-02-17 DIAGNOSIS — M542 Cervicalgia: Secondary | ICD-10-CM | POA: Diagnosis present

## 2024-02-17 DIAGNOSIS — M5412 Radiculopathy, cervical region: Secondary | ICD-10-CM | POA: Insufficient documentation

## 2024-02-17 NOTE — Therapy (Signed)
 OUTPATIENT PHYSICAL THERAPY NECK TREATMENT   Patient Name: Eric Owen. MRN: [id number] DOB:Jan 21, 1966, 58 y.o., male Today's Date: 02/17/2024  END OF SESSION:  PT End of Session - 02/17/24 0735     Visit Number 5    Number of Visits 25    Date for PT Re-Evaluation 04/18/24    Authorization Type [id number]    Authorization - Number of Visits 60    Progress Note Due on Visit 10    PT Start Time 0736    PT Stop Time 0820    PT Time Calculation (min) 44 min    Activity Tolerance Patient tolerated treatment well    Behavior During Therapy WFL for tasks assessed/performed                Past Medical History:  Diagnosis Date   Arthritis    Blood transfusion    GERD (gastroesophageal reflux disease)    Hypertension    Peptic ulcer    Past Surgical History:  Procedure Laterality Date   BUNIONECTOMY     CARPAL TUNNEL RELEASE     right   CERVICAL FUSION     COLONOSCOPY N/A 03/04/2015   Procedure: COLONOSCOPY;  Surgeon: Jerlean Mood, MD;  Location: [facility];  Service: Endoscopy;  Laterality: N/A;   HAMMER TOE SURGERY     POSTERIOR CERVICAL FUSION/FORAMINOTOMY  03/16/2012   Procedure: POSTERIOR CERVICAL FUSION/FORAMINOTOMY LEVEL 1;  Surgeon: Isadora Mar, MD;  Location: [facility];  Service: Neurosurgery;  Laterality: N/A;  Cervical six-seven posterior cervical fusion with lateral mass screws   Patient Active Problem List   Diagnosis Date Noted   Encounter for preventive care 09/14/2022   Type 2 diabetes mellitus without complication, without long-term current use of insulin (HCC) 08/12/2020   Mixed dyslipidemia 05/13/2020   Tobacco abuse counseling 05/07/2020   Essential hypertension    GERD (gastroesophageal reflux disease)    Arthritis     PCP: Theron Flavin, MD  REFERRING PROVIDER: Jeannette Mills, NP  REFERRING DIAG:  M54.12 (ICD-10-CM) - Radiculopathy, cervical region    RATIONALE FOR EVALUATION AND TREATMENT:  Rehabilitation  THERAPY DIAG: Radiculopathy, cervical region  Cervicalgia  ONSET DATE: Chronic Neck pain   FOLLOW-UP APPT SCHEDULED WITH REFERRING PROVIDER: Yes April 24th    SUBJECTIVE:                                                                                                                                                                                         Chief Complaint: Neck pain   Pertinent History Pt reports pain that starts in the neck travels into the shoulder,  elbow and stops at the fingers. Neck pain that radiates to both of his arms (R > L). Pt reports that he has been advised of bulging discs existing at multiple cervical levels. He has a hard time sleeping at night due to the pain. Aggravations include repetitive UE use, over head motions. Patient states that he currently takes oxycodone and tylenol  for pain relief. 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 The reports that he had a discussion with referring provider regarding cervical surgery in order to reduce pain but is willing to try OPPT in order to maximize return to PLOF. The patient reports numbness and tingling starting from posterior shoulder referring distally to the 4th and 5th digits. He has noticed his grip strength in his R hand has started becoming weaker in comparison to LUE. Denies MOI, facial weakness, and visual changes.   Dominant hand: right  Imaging (Per Chart Review):  CT CERVICAL SPINE FINDINGS  Alignment: Normal cervical lordosis.   Skull base and vertebrae: No acute fracture. No primary bone lesion or focal pathologic process.   Soft tissues and spinal canal: No prevertebral fluid or swelling. No visible canal hematoma.   Disc levels: Status post PLIF at C6-7. Mild degenerative changes, most prominent at C5-6. Spinal canal is patent.   Upper chest: Visualized lung apices are clear.   Other: Visualized thyroid  is unremarkable.    IMPRESSION: Normal head CT.   No traumatic injury to the cervical spine. Status post PLIF at C6-7. Mild degenerative changes.  Pain:  Pain Intensity: Present:4 /10, Best: 4/10, Worst: 10/10 Pain location: Bilateral Neck, Shoulder and Wrist (L > R)   Pain Quality: constant, tingling, and aching  Radiating: Yes  Numbness/Tingling: Yes Focal Weakness: No History of prior neck injury, pain, surgery, or therapy: No Red flags (personal history of cancer, h/o spinal tumors, history of compression fracture, chills/fever, night sweats, nausea, vomiting, unrelenting pain): Negative  PRECAUTIONS: Fall  WEIGHT BEARING RESTRICTIONS: No  FALLS: Has patient fallen in last 6 months? No  Living Environment Lives with: lives with their spouse Lives in: House/apartment Stairs: Yes: Internal: 14 steps; on left going up Has following equipment at home: None  Prior level of function: Independent  Occupational demands: Passenger transport manager   Hobbies: Watch TV, Hands-On Tasks, Drawing   Patient Goals: Reduce Pain, Improve Cervical ROM and increase grip strength.    OBJECTIVE:   Patient Surveys: NDI: Deferred to next session    Cognition Patient is oriented to person, place, and time.  Recent memory is intact.  Attention span and concentration are intact.  Expressive speech is intact.     Gross Musculoskeletal Assessment Tremor: None Bulk: Normal Tone: Normal  Gait Deferred   Posture Rounded Posture, Forward Posture   AROM AROM (Normal range in degrees) AROM  Cervical  Flexion (50) 40  Extension (80) WNL  Right lateral flexion (45) WNL *   Left lateral flexion (45) WNL   Right rotation (85) 40  Left rotation (85) 45  (* = pain; Blank rows = not tested)  UPPER EXTREMITY ROM:  Active ROM Right eval Left eval  Shoulder flexion 145 160  Shoulder extension    Shoulder abduction    Shoulder adduction    Shoulder extension    Shoulder internal rotation 60 *    Shoulder external rotation 50 *   Elbow flexion    Elbow extension    Wrist flexion    Wrist extension  Wrist ulnar deviation    Wrist radial deviation    Wrist pronation    Wrist supination    (* = pain; Blank rows = not tested)  UPPER EXTREMITY MMT:  MMT Right eval Left eval  Shoulder flexion 4+ *  5  Shoulder extension    Shoulder abduction 4/5 *  5  Shoulder adduction    Shoulder extension    Shoulder internal rotation    Shoulder external rotation    Middle trapezius    Lower trapezius    Elbow flexion 5 5  Elbow extension 5 5  Wrist flexion 5 5  Wrist extension 5 5  Wrist ulnar deviation    Wrist radial deviation    Wrist pronation    Wrist supination    Grip strength      (* = pain; Blank rows = not tested)  MMT MMT (out of 5) Right Left  Cervical (isometric)  Flexion WNL  Extension WNL  Lateral Flexion WNL WNL  Rotation WNL WNL      (* = pain; Blank rows = not tested)  Sensation Grossly intact with light touch bilaterally.  Reflexes Not Tested   Palpation Location LEFT  RIGHT           Suboccipitals 0 0  Cervical paraspinals 0 0  Upper Trapezius 0 0  Levator Scapulae 0 0  Rhomboid Major/Minor 0 0  (Blank rows = not tested) Graded on 0-4 scale (0 = no pain, 1 = pain, 2 = pain with wincing/grimacing/flinching, 3 = pain with withdrawal, 4 = unwilling to allow palpation), (Blank rows = not tested)  Repeated Movements No centralization or peripheralization of symptoms with repeated cervical retraction.   Passive Accessory Intervertebral Motion Not Tested   SPECIAL TESTS Spurlings A (ipsilateral lateral flexion/axial compression): R: Positive L: Not Tested  ULTT Radial R: Positive for Pain   Clinical Prediction Rules  Diagnostic Cervical Radiculopathy ULTTa: Any one of the following: A) symptom reproduction; B) side-to-side difference >10 degrees in elbow extension; or C) with regard to involved/painful side: ipsilateral neck lateral  flexion decreases symptoms and/or contralateral neck lateral flexion increases symptoms.  ROM: involved-side cervical rotation range of motion less than 60 degrees Distraction test: symptom reduction.  Spurling's A: symptom reproduction.   Number of Positive Criteria  Sensitivity  Specificity  Pos LR  Neg LR   Two  0.39 0.56 0.88 1.09   Three  0.39  0.94  6.1 0.65   Four  0.24 0.99  30.3 0.77   Pos LR = positive likelihood ratio. Neg LR = negative likelihood ratio.    TODAY'S TREATMENT:  02/17/2024    Subjective: Neck is fine today, no pain currently. Was a little achy after last session but then it went away. Neck feels like it is improving since starting PT. The symptoms on his fingers are better. Still dropping things at work, with his R hand; uses his R hand at work a lot. The frequency of dropping items is maybe not as much since a month ago. Currently has B UE radiaing symptoms (along C8/T1 dermatome to fingers)  Neck surgeries were in 2007, 2013.  Going to do a 3rd neck surgery which pt is currently waiting on scheduling: C4-C6 cervical fusion.   Usually lifts 35-40 lbs at work.   Neuromuscular re-education  Supine cervical nod while pressing tongue at roof of mouth 10x3 with 5 second holds  Then with R and L cervical rotation cues to prevent cervical protraction  and side bend compensation, evident during R cervical rotation   Small movements performed to help prevent  compensation 10x3  Supine chin tucks with B scapular retraction 10x5 seconds, then 10x10 seconds for 2 sets   Decreased B C8/T1 dermatome afterwards  Then with Supine B shoulder flexion 10x5 seconds for 3 sets   No B UE symptoms, feels better afterwards per pt.   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     Improved exercise technique, movement at target joints, use of target muscles after mod verbal, visual, tactile cues.        PATIENT EDUCATION:  Education details: HEP, POC  Person educated: Patient Education method: Explanation, Demonstration, and Handouts Education comprehension: verbalized understanding, returned demonstration, and verbal cues required   HOME EXERCISE PROGRAM:   Access Code: [email or web address] URL: [email or web address] Date: 02/17/2024 Prepared by:   Exercises - Seated Cervical Retraction  - 1 x daily - 7 x weekly - 3 sets - 10-12 reps - 5s hold - Shoulder External Rotation and Scapular Retraction with Resistance  - 1 x daily - 3-4 x weekly - 2-3 sets - 10-12 reps - Standing Radial Nerve Glide  - 1 x daily - 7 x weekly - 3 sets - 10-12 reps - Standing Shoulder Row with Anchored Resistance  - 1 x daily - 7 x weekly - 3 sets - 10-12 reps - Seated Upper Trapezius Stretch  - 1 x daily - 7 x weekly - 3 sets - 30-60 hold - Seated Levator Scapulae Stretch  - 1 x daily - 7 x weekly - 3 sets - 30-60 hold   Seated first rib stretch with strap   With cervical R and L rotation    R 10x3. Decreased R posterior lateral cervical discomfort with R and L cervical rotation afterwards   L 10x3  - Supine Cervical Rotation AROM on Pillow  - 1 x daily - 7 x weekly - 3 sets - 10 reps - Supine Shoulder Flexion Extension Full Range AROM  - 1 x daily - 7 x weekly - 3 sets - 10 reps - 5 seconds hold     02/14/2024 Drawing handout [facility])  Seated first rib stretch with strap   With cervical R and L rotation    R 10x3. Decreased R posterior lateral cervical discomfort with R and L cervical rotation afterwards   L 10x3    Access Code: [email or web address] URL: [email or web address] Date: 02/01/2024 Prepared by:  Veryl Gottron Go  Exercises - Seated Cervical Retraction  - 1 x daily - 7 x weekly - 3 sets - 10-12 reps - 5s hold - Shoulder External Rotation and Scapular Retraction with Resistance  - 1 x daily - 3-4 x weekly - 2-3 sets - 10-12 reps - Standing Radial Nerve Glide  - 1 x daily - 7 x weekly - 3 sets - 10-12 reps - Standing Shoulder Row with Anchored Resistance  - 1 x daily - 7 x weekly - 3 sets - 10-12 reps - Seated Upper Trapezius Stretch  - 1 x daily - 7 x weekly - 3 sets -  30-60 hold - Seated Levator Scapulae Stretch  - 1 x daily - 7 x weekly - 3 sets - 30-60 hold    HEP: Access Code: [email or web address] URL: [email or web address] Date: 01/25/2024 Prepared by: Veryl Gottron Go  Exercises - Seated Cervical Retraction  - 1 x daily - 7 x weekly - 3 sets - 10-12 reps - 5s hold - Shoulder External Rotation and Scapular Retraction with Resistance  - 1 x daily - 3-4 x weekly - 2-3 sets - 10-12 reps - Standing Radial Nerve Glide  - 1 x daily - 7 x weekly - 3 sets - 10-12 reps - Standing Shoulder Row with Anchored Resistance  - 1 x daily - 7 x weekly - 3 sets - 10-12 reps  ASSESSMENT:  CLINICAL IMPRESSION:  Worked on improving anterior cervical muscle strength and ability to control movement, especially with R cervical rotation in which pt compensates with cervical extension, protraction and R cervical side bend which may potentially place stress to his R UE nerves, above and below the fused level. Worked on thoracic extension to decrease extension stress to C8/T1 dermatome. 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 No B UE paresthesia or neck pain reported after session. Pt states feeling good after session. Pt will benefit from continued skilled physical therapy services to decrease pain, improve strength and function.       OBJECTIVE IMPAIRMENTS: decreased ROM, decreased strength, impaired sensation, and pain.   ACTIVITY LIMITATIONS: carrying, lifting, sleeping, and reach over head  PARTICIPATION LIMITATIONS: driving, occupation, and yard work  PERSONAL FACTORS: Age, Profession, and Time since onset of injury/illness/exacerbation are also affecting patient's functional outcome.   REHAB POTENTIAL: Fair Patient is motivated to maximize return to PLOF but he has had numerous ESI with little relief. Referring provider's impression on recent imaging suggest that osteophyte and degeneration of multiple vertebral spaces indicate surgical interventions.   CLINICAL DECISION MAKING: Evolving/moderate complexity  EVALUATION COMPLEXITY: Moderate   GOALS: Goals reviewed with patient? No  SHORT TERM GOALS: Target date: 03/30/2024  Pt will be independent with HEP to improve strength and decrease neck pain to improve pain-free function at home and work. Baseline: n/a Goal status: INITIAL   LONG TERM GOALS: Target date: 05/11/2024  Pt will improve right-hand grip strength by at least within 10% of the left hand's current grip strength via hand dynamometer in order to demonstrate improvements in hand strength and functional tasks such as gripping tools at work.  Baseline: n/a  Goal status: Deferred  2.  Pt will decrease worst neck pain by at least 2 points on the NPRS in order to demonstrate clinically significant reduction in neck pain. Baseline: 01/25/2024: 10/10 Goal status: INITIAL  3.  Pt will decrease NDI score by at least 19% in order demonstrate clinically significant reduction in neck pain/disability.       Baseline: 11 / 50 = 22.0 % Goal status: INITIAL  4. Pt will report improvements in sleep ( > 6 hours) with minimal pain ( < 3/10 NPS) and/or less sleep disruptions (< 2-3) in order to demonstrate clinically significant reduction in neck pain.  Baseline:  Goal status: INITIAL      PLAN: PT  FREQUENCY: 1-2x/week  PT DURATION: 12 weeks  PLANNED INTERVENTIONS: Therapeutic exercises, Therapeutic activity, Neuromuscular re-education, Balance training, Gait training, Patient/Family  education, Self Care, Joint mobilization, Joint manipulation, DME instructions, Dry Needling, Electrical stimulation, Spinal manipulation, Spinal mobilization, Cryotherapy, Moist heat, Taping, Traction, Ultrasound, Manual therapy, and Re-evaluation.  PLAN FOR NEXT SESSION:  progress postural strengthening, cervical retractions exercises, manual therapy PRN  Clemons Salvucci PT, DPT  02/17/2024, 8:35 AM

## 2024-02-20 ENCOUNTER — Encounter

## 2024-02-22 ENCOUNTER — Ambulatory Visit

## 2024-02-22 DIAGNOSIS — M5412 Radiculopathy, cervical region: Secondary | ICD-10-CM | POA: Diagnosis not present

## 2024-02-22 DIAGNOSIS — M542 Cervicalgia: Secondary | ICD-10-CM

## 2024-02-22 NOTE — Therapy (Signed)
 OUTPATIENT PHYSICAL THERAPY NECK TREATMENT   Patient Name: Eric Owen. MRN: [id number] DOB:11/18/1965, 58 y.o., male Today's Date: 02/22/2024  END OF SESSION:  PT End of Session - 02/22/24 0901     Visit Number 6    Number of Visits 25    Date for PT Re-Evaluation 04/18/24    Authorization Type [id number]    Authorization - Number of Visits 60    Progress Note Due on Visit 10    PT Start Time 0902    PT Stop Time 0940    PT Time Calculation (min) 38 min    Activity Tolerance Patient tolerated treatment well    Behavior During Therapy WFL for tasks assessed/performed                 Past Medical History:  Diagnosis Date   Arthritis    Blood transfusion    GERD (gastroesophageal reflux disease)    Hypertension    Peptic ulcer    Past Surgical History:  Procedure Laterality Date   BUNIONECTOMY     CARPAL TUNNEL RELEASE     right   CERVICAL FUSION     COLONOSCOPY N/A 03/04/2015   Procedure: COLONOSCOPY;  Surgeon: Jerlean Mood, MD;  Location: [facility];  Service: Endoscopy;  Laterality: N/A;   HAMMER TOE SURGERY     POSTERIOR CERVICAL FUSION/FORAMINOTOMY  03/16/2012   Procedure: POSTERIOR CERVICAL FUSION/FORAMINOTOMY LEVEL 1;  Surgeon: Isadora Mar, MD;  Location: [facility];  Service: Neurosurgery;  Laterality: N/A;  Cervical six-seven posterior cervical fusion with lateral mass screws   Patient Active Problem List   Diagnosis Date Noted   Encounter for preventive care 09/14/2022   Type 2 diabetes mellitus without complication, without long-term current use of insulin (HCC) 08/12/2020   Mixed dyslipidemia 05/13/2020   Tobacco abuse counseling 05/07/2020   Essential hypertension    GERD (gastroesophageal reflux disease)    Arthritis     PCP: Theron Flavin, MD  REFERRING PROVIDER: Jeannette Mills, NP  REFERRING DIAG:  M54.12 (ICD-10-CM) - Radiculopathy, cervical region    RATIONALE FOR EVALUATION AND TREATMENT:  Rehabilitation  THERAPY DIAG: Radiculopathy, cervical region  Cervicalgia  ONSET DATE: Chronic Neck pain   FOLLOW-UP APPT SCHEDULED WITH REFERRING PROVIDER: Yes April 24th    SUBJECTIVE:                                                                                                                                                                                         Chief Complaint: Neck pain   Pertinent History Pt reports pain that starts in the neck travels into the  shoulder, elbow and stops at the fingers. Neck pain that radiates to both of his arms (R > L). Pt reports that he has been advised of bulging discs existing at multiple cervical levels. He has a hard time sleeping at night due to the pain. Aggravations include repetitive UE use, over head motions. Patient states that he currently takes oxycodone and tylenol  for pain relief. 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 The reports that he had a discussion with referring provider regarding cervical surgery in order to reduce pain but is willing to try OPPT in order to maximize return to PLOF. The patient reports numbness and tingling starting from posterior shoulder referring distally to the 4th and 5th digits. He has noticed his grip strength in his R hand has started becoming weaker in comparison to LUE. Denies MOI, facial weakness, and visual changes.   Dominant hand: right  Imaging (Per Chart Review):  CT CERVICAL SPINE FINDINGS  Alignment: Normal cervical lordosis.   Skull base and vertebrae: No acute fracture. No primary bone lesion or focal pathologic process.   Soft tissues and spinal canal: No prevertebral fluid or swelling. No visible canal hematoma.   Disc levels: Status post PLIF at C6-7. Mild degenerative changes, most prominent at C5-6. Spinal canal is patent.   Upper chest: Visualized lung apices are clear.   Other: Visualized thyroid  is unremarkable.    IMPRESSION: Normal head CT.   No traumatic injury to the cervical spine. Status post PLIF at C6-7. Mild degenerative changes.  Pain:  Pain Intensity: Present:4 /10, Best: 4/10, Worst: 10/10 Pain location: Bilateral Neck, Shoulder and Wrist (L > R)   Pain Quality: constant, tingling, and aching  Radiating: Yes  Numbness/Tingling: Yes Focal Weakness: No History of prior neck injury, pain, surgery, or therapy: No Red flags (personal history of cancer, h/o spinal tumors, history of compression fracture, chills/fever, night sweats, nausea, vomiting, unrelenting pain): Negative  PRECAUTIONS: Fall  WEIGHT BEARING RESTRICTIONS: No  FALLS: Has patient fallen in last 6 months? No  Living Environment Lives with: lives with their spouse Lives in: House/apartment Stairs: Yes: Internal: 14 steps; on left going up Has following equipment at home: None  Prior level of function: Independent  Occupational demands: Passenger transport manager   Hobbies: Watch TV, Hands-On Tasks, Drawing   Patient Goals: Reduce Pain, Improve Cervical ROM and increase grip strength.    OBJECTIVE:   Patient Surveys: NDI: Deferred to next session    Cognition Patient is oriented to person, place, and time.  Recent memory is intact.  Attention span and concentration are intact.  Expressive speech is intact.     Gross Musculoskeletal Assessment Tremor: None Bulk: Normal Tone: Normal  Gait Deferred   Posture Rounded Posture, Forward Posture   AROM AROM (Normal range in degrees) AROM  Cervical  Flexion (50) 40  Extension (80) WNL  Right lateral flexion (45) WNL *   Left lateral flexion (45) WNL   Right rotation (85) 40  Left rotation (85) 45  (* = pain; Blank rows = not tested)  UPPER EXTREMITY ROM:  Active ROM Right eval Left eval  Shoulder flexion 145 160  Shoulder extension    Shoulder abduction    Shoulder adduction    Shoulder extension    Shoulder internal rotation 60 *    Shoulder external rotation 50 *   Elbow flexion    Elbow extension    Wrist flexion    Wrist  extension    Wrist ulnar deviation    Wrist radial deviation    Wrist pronation    Wrist supination    (* = pain; Blank rows = not tested)  UPPER EXTREMITY MMT:  MMT Right eval Left eval  Shoulder flexion 4+ *  5  Shoulder extension    Shoulder abduction 4/5 *  5  Shoulder adduction    Shoulder extension    Shoulder internal rotation    Shoulder external rotation    Middle trapezius    Lower trapezius    Elbow flexion 5 5  Elbow extension 5 5  Wrist flexion 5 5  Wrist extension 5 5  Wrist ulnar deviation    Wrist radial deviation    Wrist pronation    Wrist supination    Grip strength      (* = pain; Blank rows = not tested)  MMT MMT (out of 5) Right Left  Cervical (isometric)  Flexion WNL  Extension WNL  Lateral Flexion WNL WNL  Rotation WNL WNL      (* = pain; Blank rows = not tested)  Sensation Grossly intact with light touch bilaterally.  Reflexes Not Tested   Palpation Location LEFT  RIGHT           Suboccipitals 0 0  Cervical paraspinals 0 0  Upper Trapezius 0 0  Levator Scapulae 0 0  Rhomboid Major/Minor 0 0  (Blank rows = not tested) Graded on 0-4 scale (0 = no pain, 1 = pain, 2 = pain with wincing/grimacing/flinching, 3 = pain with withdrawal, 4 = unwilling to allow palpation), (Blank rows = not tested)  Repeated Movements No centralization or peripheralization of symptoms with repeated cervical retraction.   Passive Accessory Intervertebral Motion Not Tested   SPECIAL TESTS Spurlings A (ipsilateral lateral flexion/axial compression): R: Positive L: Not Tested  ULTT Radial R: Positive for Pain   Clinical Prediction Rules  Diagnostic Cervical Radiculopathy ULTTa: Any one of the following: A) symptom reproduction; B) side-to-side difference >10 degrees in elbow extension; or C) with regard to involved/painful side: ipsilateral neck lateral  flexion decreases symptoms and/or contralateral neck lateral flexion increases symptoms.  ROM: involved-side cervical rotation range of motion less than 60 degrees Distraction test: symptom reduction.  Spurling's A: symptom reproduction.   Number of Positive Criteria  Sensitivity  Specificity  Pos LR  Neg LR   Two  0.39 0.56 0.88 1.09   Three  0.39  0.94  6.1 0.65   Four  0.24 0.99  30.3 0.77   Pos LR = positive likelihood ratio. Neg LR = negative likelihood ratio.    TODAY'S TREATMENT:  02/22/2024    Subjective: 1/10 pain located in R elbow. Some soreness/achy sensation for a day following last PT session. Was a little achy after last session but then it went away. Neck feels like it is improving since starting PT.  Pt reports continued weakness with grip.   Neck surgeries were in 2007, 2013.  Going to do a 3rd neck surgery which pt is currently waiting on scheduling: C4-C6 cervical fusion.   Usually lifts 35-40 lbs at work.    Thereapeutic Exercise:   Scapular Row with Scapular Retraction  3 x 10 Black TB   Scapular Retraction with Scaption against resistance  3 x 10 Red TB   Shoulder Low Row with Chin Tuck  3 x 10 x 5s hold Green Tb  Seated Press-Ups on Mat Table   1 x 10 x  3s   2 x 10 x 3s off mat table for deeper range  Standing Pronation/Supination with Hammer (4# AW attached at head of hammer) for elbow strengthening and grip strength   2 x 10 reps  - vc for tempo  Neuromuscular re-education  Supine Thoracic Extension on Foam roller for Thoracic mobility, pain modulation and decrease parasthesia for 3 x 10 reps    Foam roller placed C7-T1 segment; pt endorsed decreased pain and decreased dermatomal pattern in elbow.  Supine chin tucks with B scapular retraction 2 x 10reps x 5s hold  Decreased pain sensation along R shoulder.   Seated Thoracic Extension for thoracic mobility, pain modulation and decrease parasthesia   3 x 10 reps      PATIENT EDUCATION:   Education details: HEP, POC  Person educated: Patient Education method: Explanation, Demonstration, and Handouts Education comprehension: verbalized understanding, returned demonstration, and verbal cues required   HOME EXERCISE PROGRAM:   Access Code: [email or web address] URL: [email or web address] Date: 02/17/2024 Prepared by:   Exercises - Seated Cervical Retraction  - 1 x daily - 7 x weekly - 3 sets - 10-12 reps - 5s hold - Shoulder External Rotation and Scapular Retraction with Resistance  - 1 x daily - 3-4 x weekly - 2-3 sets - 10-12 reps - Standing Radial Nerve Glide  - 1 x daily - 7 x weekly - 3 sets - 10-12 reps - Standing Shoulder Row with Anchored Resistance  - 1 x daily - 7 x weekly - 3 sets - 10-12 reps - Seated Upper Trapezius Stretch  - 1 x daily - 7 x weekly - 3 sets - 30-60 hold - Seated Levator Scapulae Stretch  - 1 x daily - 7 x weekly - 3 sets - 30-60 hold   Seated first rib stretch with strap   With cervical R and L rotation    R 10x3. Decreased R posterior lateral cervical discomfort with R and L cervical rotation afterwards   L 10x3  - Supine Cervical Rotation AROM on Pillow  - 1 x daily - 7 x weekly - 3 sets - 10 reps - Supine Shoulder Flexion Extension Full Range AROM  - 1 x daily - 7 x weekly - 3 sets - 10 reps - 5 seconds hold     02/14/2024 Drawing handout [facility])  Seated first rib stretch with strap   With cervical R and L rotation    R 10x3. Decreased R posterior lateral cervical discomfort with R and L cervical rotation afterwards   L 10x3    Access Code: [email or web address] URL: [email or web address] Date: 02/01/2024 Prepared by: Veryl Gottron Sherelle Castelli  Exercises - Seated Cervical Retraction  - 1 x daily - 7 x weekly - 3 sets - 10-12 reps - 5s hold - Shoulder External Rotation and Scapular Retraction with Resistance  - 1 x daily - 3-4 x weekly - 2-3 sets - 10-12 reps - Standing Radial Nerve Glide  - 1 x daily - 7 x weekly - 3  sets - 10-12 reps - Standing Shoulder Row with Anchored Resistance  - 1 x daily - 7 x weekly - 3 sets - 10-12 reps - Seated Upper Trapezius Stretch  - 1 x daily - 7 x weekly - 3 sets - 30-60 hold - Seated Levator Scapulae Stretch  - 1 x daily - 7 x weekly - 3 sets - 30-60 hold  ASSESSMENT:  CLINICAL IMPRESSION: Continued PT POC on improving cervical strength and postural awareness. Pt with  decreased neck pain but continues to report intermittent grip weakness. Worked on functional grip with supination/pronation; good return demo from patient. Pt tolerated all interventions without report of additional pain. PT educated patient on additional thoracic extension techniques with use of foam roller. Pt endorsed reduced pain in R neck, shoulder elbow at end of session. Pt will continue to benefit from skilled PT in order to address current deficits and improve QoL.     OBJECTIVE IMPAIRMENTS: decreased ROM, decreased strength, impaired sensation, and pain.   ACTIVITY LIMITATIONS: carrying, lifting, sleeping, and reach over head  PARTICIPATION LIMITATIONS: driving, occupation, and yard work  PERSONAL FACTORS: Age, Profession, and Time since onset of injury/illness/exacerbation are also affecting patient's functional outcome.   REHAB POTENTIAL: Fair Patient is motivated to maximize return to PLOF but he has had numerous ESI with little relief. Referring provider's impression on recent imaging suggest that osteophyte and degeneration of multiple vertebral spaces indicate surgical interventions.   CLINICAL DECISION MAKING: Evolving/moderate complexity  EVALUATION COMPLEXITY: Moderate   GOALS: Goals reviewed with patient? No  SHORT TERM GOALS: Target date: 04/04/2024  Pt will be independent with HEP to improve strength and decrease neck pain to improve pain-free function at home and work. Baseline: n/a Goal status: INITIAL   LONG TERM GOALS: Target date: 05/16/2024  Pt will improve  right-hand grip strength by at least within 10% of the left hand's current grip strength via hand dynamometer in order to demonstrate improvements in hand strength and functional tasks such as gripping tools at work.  Baseline: n/a  Goal status: Deferred  2.  Pt will decrease worst neck pain by at least 2 points on the NPRS in order to demonstrate clinically significant reduction in neck pain. Baseline: 01/25/2024: 10/10 Goal status: INITIAL  3.  Pt will decrease NDI score by at least 19% in order demonstrate clinically significant reduction in neck pain/disability.       Baseline: 11 / 50 = 22.0 % Goal status: INITIAL  4. Pt will report improvements in sleep ( > 6 hours) with minimal pain ( < 3/10 NPS) and/or less sleep disruptions (< 2-3) in order to demonstrate clinically significant reduction in neck pain.  Baseline:  Goal status: INITIAL      PLAN: PT FREQUENCY: 1-2x/week  PT DURATION: 12 weeks  PLANNED INTERVENTIONS: Therapeutic exercises, Therapeutic activity, Neuromuscular re-education, Balance training, Gait training, Patient/Family education, Self Care, Joint mobilization, Joint manipulation, DME instructions, Dry Needling, Electrical stimulation, Spinal manipulation, Spinal mobilization, Cryotherapy, Moist heat, Taping, Traction, Ultrasound, Manual therapy, and Re-evaluation.  PLAN FOR NEXT SESSION:  progress postural strengthening, cervical retractions exercises, manual therapy PRN  Miguel Laygo PT, DPT  02/22/2024, 9:02 AM

## 2024-02-23 ENCOUNTER — Encounter

## 2024-02-28 ENCOUNTER — Ambulatory Visit

## 2024-02-28 ENCOUNTER — Encounter

## 2024-03-01 ENCOUNTER — Encounter

## 2024-03-03 LAB — HM DIABETES EYE EXAM

## 2024-03-06 ENCOUNTER — Encounter

## 2024-03-06 ENCOUNTER — Ambulatory Visit

## 2024-03-06 DIAGNOSIS — M5412 Radiculopathy, cervical region: Secondary | ICD-10-CM

## 2024-03-06 DIAGNOSIS — M542 Cervicalgia: Secondary | ICD-10-CM

## 2024-03-06 NOTE — Therapy (Signed)
 OUTPATIENT PHYSICAL THERAPY NECK TREATMENT   Patient Name: Eric Owen. MRN: [id number] DOB:1966-03-23, 58 y.o., male Today's Date: 03/06/2024  END OF SESSION:  PT End of Session - 03/06/24 0903     Visit Number 7    Number of Visits 25    Date for PT Re-Evaluation 04/18/24    Authorization Type [employer] [id number]    Authorization - Number of Visits 60    Progress Note Due on Visit 10    PT Start Time 0903    PT Stop Time 0942    PT Time Calculation (min) 39 min    Activity Tolerance Patient tolerated treatment well    Behavior During Therapy WFL for tasks assessed/performed                 Past Medical History:  Diagnosis Date   Arthritis    Blood transfusion    GERD (gastroesophageal reflux disease)    Hypertension    Peptic ulcer    Past Surgical History:  Procedure Laterality Date   BUNIONECTOMY     CARPAL TUNNEL RELEASE     right   CERVICAL FUSION     COLONOSCOPY N/A 03/04/2015   Procedure: COLONOSCOPY;  Surgeon: Jerlean Mood, MD;  Location: [facility];  Service: Endoscopy;  Laterality: N/A;   HAMMER TOE SURGERY     POSTERIOR CERVICAL FUSION/FORAMINOTOMY  03/16/2012   Procedure: POSTERIOR CERVICAL FUSION/FORAMINOTOMY LEVEL 1;  Surgeon: Isadora Mar, MD;  Location: [facility];  Service: Neurosurgery;  Laterality: N/A;  Cervical six-seven posterior cervical fusion with lateral mass screws   Patient Active Problem List   Diagnosis Date Noted   Encounter for preventive care 09/14/2022   Type 2 diabetes mellitus without complication, without long-term current use of insulin (HCC) 08/12/2020   Mixed dyslipidemia 05/13/2020   Tobacco abuse counseling 05/07/2020   Essential hypertension    GERD (gastroesophageal reflux disease)    Arthritis     PCP: Theron Flavin, MD  REFERRING PROVIDER: Jeannette Mills, NP  REFERRING DIAG:  M54.12 (ICD-10-CM) - Radiculopathy, cervical region    RATIONALE FOR EVALUATION AND TREATMENT:  Rehabilitation  THERAPY DIAG: Radiculopathy, cervical region  Cervicalgia  ONSET DATE: Chronic Neck pain   FOLLOW-UP APPT SCHEDULED WITH REFERRING PROVIDER: Yes April 24th    SUBJECTIVE:                                                                                                                                                                                         Chief Complaint: Neck pain   Pertinent History Pt reports pain that starts in the neck travels into the  shoulder, elbow and stops at the fingers. Neck pain that radiates to both of his arms (R > L). Pt reports that he has been advised of bulging discs existing at multiple cervical levels. He has a hard time sleeping at night due to the pain. Aggravations include repetitive UE use, over head motions. Patient states that he currently takes oxycodone and tylenol  for pain relief. 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 The reports that he had a discussion with referring provider regarding cervical surgery in order to reduce pain but is willing to try OPPT in order to maximize return to PLOF. The patient reports numbness and tingling starting from posterior shoulder referring distally to the 4th and 5th digits. He has noticed his grip strength in his R hand has started becoming weaker in comparison to LUE. Denies MOI, facial weakness, and visual changes.   Dominant hand: right  Imaging (Per Chart Review):  CT CERVICAL SPINE FINDINGS  Alignment: Normal cervical lordosis.   Skull base and vertebrae: No acute fracture. No primary bone lesion or focal pathologic process.   Soft tissues and spinal canal: No prevertebral fluid or swelling. No visible canal hematoma.   Disc levels: Status post PLIF at C6-7. Mild degenerative changes, most prominent at C5-6. Spinal canal is patent.   Upper chest: Visualized lung apices are clear.   Other: Visualized thyroid  is unremarkable.    IMPRESSION: Normal head CT.   No traumatic injury to the cervical spine. Status post PLIF at C6-7. Mild degenerative changes.  Pain:  Pain Intensity: Present:4 /10, Best: 4/10, Worst: 10/10 Pain location: Bilateral Neck, Shoulder and Wrist (L > R)   Pain Quality: constant, tingling, and aching  Radiating: Yes  Numbness/Tingling: Yes Focal Weakness: No History of prior neck injury, pain, surgery, or therapy: No Red flags (personal history of cancer, h/o spinal tumors, history of compression fracture, chills/fever, night sweats, nausea, vomiting, unrelenting pain): Negative  PRECAUTIONS: Fall  WEIGHT BEARING RESTRICTIONS: No  FALLS: Has patient fallen in last 6 months? No  Living Environment Lives with: lives with their spouse Lives in: House/apartment Stairs: Yes: Internal: 14 steps; on left going up Has following equipment at home: None  Prior level of function: Independent  Occupational demands: Passenger transport manager   Hobbies: Watch TV, Hands-On Tasks, Drawing   Patient Goals: Reduce Pain, Improve Cervical ROM and increase grip strength.    OBJECTIVE:   Patient Surveys: NDI: Deferred to next session    Cognition Patient is oriented to person, place, and time.  Recent memory is intact.  Attention span and concentration are intact.  Expressive speech is intact.     Gross Musculoskeletal Assessment Tremor: None Bulk: Normal Tone: Normal  Gait Deferred   Posture Rounded Posture, Forward Posture   AROM AROM (Normal range in degrees) AROM  Cervical  Flexion (50) 40  Extension (80) WNL  Right lateral flexion (45) WNL *   Left lateral flexion (45) WNL   Right rotation (85) 40  Left rotation (85) 45  (* = pain; Blank rows = not tested)  UPPER EXTREMITY ROM:  Active ROM Right eval Left eval  Shoulder flexion 145 160  Shoulder extension    Shoulder abduction    Shoulder adduction    Shoulder extension    Shoulder internal rotation 60 *    Shoulder external rotation 50 *   Elbow flexion    Elbow extension    Wrist flexion    Wrist  extension    Wrist ulnar deviation    Wrist radial deviation    Wrist pronation    Wrist supination    (* = pain; Blank rows = not tested)  UPPER EXTREMITY MMT:  MMT Right eval Left eval  Shoulder flexion 4+ *  5  Shoulder extension    Shoulder abduction 4/5 *  5  Shoulder adduction    Shoulder extension    Shoulder internal rotation    Shoulder external rotation    Middle trapezius    Lower trapezius    Elbow flexion 5 5  Elbow extension 5 5  Wrist flexion 5 5  Wrist extension 5 5  Wrist ulnar deviation    Wrist radial deviation    Wrist pronation    Wrist supination    Grip strength      (* = pain; Blank rows = not tested)  MMT MMT (out of 5) Right Left  Cervical (isometric)  Flexion WNL  Extension WNL  Lateral Flexion WNL WNL  Rotation WNL WNL      (* = pain; Blank rows = not tested)  Sensation Grossly intact with light touch bilaterally.  Reflexes Not Tested   Palpation Location LEFT  RIGHT           Suboccipitals 0 0  Cervical paraspinals 0 0  Upper Trapezius 0 0  Levator Scapulae 0 0  Rhomboid Major/Minor 0 0  (Blank rows = not tested) Graded on 0-4 scale (0 = no pain, 1 = pain, 2 = pain with wincing/grimacing/flinching, 3 = pain with withdrawal, 4 = unwilling to allow palpation), (Blank rows = not tested)  Repeated Movements No centralization or peripheralization of symptoms with repeated cervical retraction.   Passive Accessory Intervertebral Motion Not Tested   SPECIAL TESTS Spurlings A (ipsilateral lateral flexion/axial compression): R: Positive L: Not Tested  ULTT Radial R: Positive for Pain   Clinical Prediction Rules  Diagnostic Cervical Radiculopathy ULTTa: Any one of the following: A) symptom reproduction; B) side-to-side difference >10 degrees in elbow extension; or C) with regard to involved/painful side: ipsilateral neck lateral  flexion decreases symptoms and/or contralateral neck lateral flexion increases symptoms.  ROM: involved-side cervical rotation range of motion less than 60 degrees Distraction test: symptom reduction.  Spurling's A: symptom reproduction.   Number of Positive Criteria  Sensitivity  Specificity  Pos LR  Neg LR   Two  0.39 0.56 0.88 1.09   Three  0.39  0.94  6.1 0.65   Four  0.24 0.99  30.3 0.77   Pos LR = positive likelihood ratio. Neg LR = negative likelihood ratio.    TODAY'S TREATMENT:  03/06/2024    Subjective: 3/10 pain in the neck and shoulder. Pt still reports sudden decrease in grip strength; pt dropped wrench at work suddenly. Pt also randomly dropped object (pool chalk) without realizing. Pt feels PT interventions assist with pain but it increases after a few days. Pt reports that cervical surgery procedure was denied by insurance. No questions or concerns.   Neck surgeries were in 2007, 2013.  Going to do a 3rd neck surgery which pt is currently waiting on scheduling: C4-C6 cervical fusion.   Usually lifts 35-40 lbs at work.    Thereapeutic Exercise:  Seated Elbow Flexion    R: 1 x 10 4#DB; 1 x 10 8# DB;  Scapular Retraction with Scaption against resistance  1 x 12, Red TB  2 x 12, Green TB  Shoulder Low Row with [employer]  Tuck  2 x 12, Blue TB  Seated Pronation/Supination with Hammer (4# AW at distal)  1 x 10 AROM 2 x 10 with 4# AW    Shoulder Mid Row against resistance  3 x 12 Blue TB   Standing lat pull down with short bar  2 x 12 25#   1 x 12 20#   Neuromuscular re-education  Seated R first rib self mobilization with strap  With cervical R and L rotation  2 x 10 R/L Cervical Rotations    Supine chin tucks with B scapular retraction   2 x 10 reps x 5s hold  Decreased pain sensation along R shoulder.   Seated Thoracic Extension for thoracic mobility, pain modulation and decrease parasthesia   2 x 10 reps      PATIENT EDUCATION:  Education details: HEP,  POC  Person educated: Patient Education method: Explanation, Demonstration, and Handouts Education comprehension: verbalized understanding, returned demonstration, and verbal cues required   HOME EXERCISE PROGRAM:   Access Code: [email or web address] URL: [email or web address] Date: 02/17/2024 Prepared by:   Exercises - Seated Cervical Retraction  - 1 x daily - 7 x weekly - 3 sets - 10-12 reps - 5s hold - Shoulder External Rotation and Scapular Retraction with Resistance  - 1 x daily - 3-4 x weekly - 2-3 sets - 10-12 reps - Standing Radial Nerve Glide  - 1 x daily - 7 x weekly - 3 sets - 10-12 reps - Standing Shoulder Row with Anchored Resistance  - 1 x daily - 7 x weekly - 3 sets - 10-12 reps - Seated Upper Trapezius Stretch  - 1 x daily - 7 x weekly - 3 sets - 30-60 hold - Seated Levator Scapulae Stretch  - 1 x daily - 7 x weekly - 3 sets - 30-60 hold   Seated first rib stretch with strap   With cervical R and L rotation    R 10x3. Decreased R posterior lateral cervical discomfort with R and L cervical rotation afterwards   L 10x3  - Supine Cervical Rotation AROM on Pillow  - 1 x daily - 7 x weekly - 3 sets - 10 reps - Supine Shoulder Flexion Extension Full Range AROM  - 1 x daily - 7 x weekly - 3 sets - 10 reps - 5 seconds hold     02/14/2024 Drawing handout [facility])  Seated first rib stretch with strap   With cervical R and L rotation    R 10x3. Decreased R posterior lateral cervical discomfort with R and L cervical rotation afterwards   L 10x3    Access Code: [email or web address] URL: [email or web address] Date: 02/01/2024 Prepared by: Veryl Gottron Dahir Ayer  Exercises - Seated Cervical Retraction  - 1 x daily - 7 x weekly - 3 sets - 10-12 reps - 5s hold - Shoulder External Rotation and Scapular Retraction with Resistance  - 1 x daily - 3-4 x weekly - 2-3 sets - 10-12 reps - Standing Radial Nerve Glide  - 1 x daily - 7 x weekly - 3 sets - 10-12 reps - Standing  Shoulder Row with Anchored Resistance  - 1 x daily - 7 x weekly - 3 sets - 10-12 reps - Seated Upper Trapezius Stretch  - 1 x daily - 7 x weekly - 3 sets - 30-60 hold - Seated Levator Scapulae Stretch  - 1 x daily - 7 x weekly - 3 sets - 30-60 hold  ASSESSMENT:  CLINICAL IMPRESSION: Continued PT POC  on improving parascapular strength, grip strength, and postural awareness. Pt still continues to endorse intermittent grip strength weakness; drops random objects at work or at home. PT focused on improving grip strength with supination/pronation focus. Strengthening exercises with scapular retraction focus caused minor pain in today's session. Although he has temporary relief from pain during PT sessions but it worsens following appointments. Pt's patient hasn't improved since initial visit and he has consistently reported minor pain radiating from his neck into his shoulder.  PT encouraged adherence to HEP.  Pt will continue to benefit from skilled PT in order to address current deficits and improve QoL.   OBJECTIVE IMPAIRMENTS: decreased ROM, decreased strength, impaired sensation, and pain.   ACTIVITY LIMITATIONS: carrying, lifting, sleeping, and reach over head  PARTICIPATION LIMITATIONS: driving, occupation, and yard work  PERSONAL FACTORS: Age, Profession, and Time since onset of injury/illness/exacerbation are also affecting patient's functional outcome.   REHAB POTENTIAL: Fair Patient is motivated to maximize return to PLOF but he has had numerous ESI with little relief. Referring provider's impression on recent imaging suggest that osteophyte and degeneration of multiple vertebral spaces indicate surgical interventions.   CLINICAL DECISION MAKING: Evolving/moderate complexity  EVALUATION COMPLEXITY: Moderate   GOALS: Goals reviewed with patient? No  SHORT TERM GOALS: Target date: 04/17/2024  Pt will be independent with HEP to improve strength and decrease neck pain to improve  pain-free function at home and work. Baseline: n/a Goal status: INITIAL   LONG TERM GOALS: Target date: 05/29/2024  Pt will improve right-hand grip strength by at least within 10% of the left hand's current grip strength via hand dynamometer in order to demonstrate improvements in hand strength and functional tasks such as gripping tools at work.  Baseline: n/a  Goal status: Deferred  2.  Pt will decrease worst neck pain by at least 2 points on the NPRS in order to demonstrate clinically significant reduction in neck pain. Baseline: 01/25/2024: 10/10 Goal status: INITIAL  3.  Pt will decrease NDI score by at least 19% in order demonstrate clinically significant reduction in neck pain/disability.       Baseline: 11 / 50 = 22.0 % Goal status: INITIAL  4. Pt will report improvements in sleep ( > 6 hours) with minimal pain ( < 3/10 NPS) and/or less sleep disruptions (< 2-3) in order to demonstrate clinically significant reduction in neck pain.  Baseline: 03/06/2024: 4 hours a night Goal status: Progressing      PLAN: PT FREQUENCY: 1-2x/week  PT DURATION: 12 weeks  PLANNED INTERVENTIONS: Therapeutic exercises, Therapeutic activity, Neuromuscular re-education, Balance training, Gait training, Patient/Family education, Self Care, Joint mobilization, Joint manipulation, DME instructions, Dry Needling, Electrical stimulation, Spinal manipulation, Spinal mobilization, Cryotherapy, Moist heat, Taping, Traction, Ultrasound, Manual therapy, and Re-evaluation.  PLAN FOR NEXT SESSION:  progress postural strengthening, cervical retractions exercises, manual therapy PRN  Satira Curet PT, DPT Physical Therapist- [facility]  [facility]  03/06/2024, 12:37 PM

## 2024-03-08 ENCOUNTER — Encounter

## 2024-03-08 ENCOUNTER — Ambulatory Visit

## 2024-03-08 DIAGNOSIS — M5412 Radiculopathy, cervical region: Secondary | ICD-10-CM

## 2024-03-08 DIAGNOSIS — M542 Cervicalgia: Secondary | ICD-10-CM

## 2024-03-08 NOTE — Therapy (Signed)
 OUTPATIENT PHYSICAL THERAPY NECK TREATMENT   Patient Name: Eric Owen. MRN: [id number] DOB:01/01/1966, 58 y.o., male Today's Date: 03/08/2024  END OF SESSION:  PT End of Session - 03/08/24 0902     Visit Number 8    Number of Visits 25    Date for PT Re-Evaluation 04/18/24    Authorization Type [id number]    Authorization - Number of Visits 60    Progress Note Due on Visit 10    PT Start Time 0901    PT Stop Time 0942    PT Time Calculation (min) 41 min    Activity Tolerance Patient tolerated treatment well    Behavior During Therapy WFL for tasks assessed/performed                 Past Medical History:  Diagnosis Date   Arthritis    Blood transfusion    GERD (gastroesophageal reflux disease)    Hypertension    Peptic ulcer    Past Surgical History:  Procedure Laterality Date   BUNIONECTOMY     CARPAL TUNNEL RELEASE     right   CERVICAL FUSION     COLONOSCOPY N/A 03/04/2015   Procedure: COLONOSCOPY;  Surgeon: Jerlean Mood, MD;  Location: [facility];  Service: Endoscopy;  Laterality: N/A;   HAMMER TOE SURGERY     POSTERIOR CERVICAL FUSION/FORAMINOTOMY  03/16/2012   Procedure: POSTERIOR CERVICAL FUSION/FORAMINOTOMY LEVEL 1;  Surgeon: Isadora Mar, MD;  Location: [facility];  Service: Neurosurgery;  Laterality: N/A;  Cervical six-seven posterior cervical fusion with lateral mass screws   Patient Active Problem List   Diagnosis Date Noted   Encounter for preventive care 09/14/2022   Type 2 diabetes mellitus without complication, without long-term current use of insulin (HCC) 08/12/2020   Mixed dyslipidemia 05/13/2020   Tobacco abuse counseling 05/07/2020   Essential hypertension    GERD (gastroesophageal reflux disease)    Arthritis     PCP: Theron Flavin, MD  REFERRING PROVIDER: Jeannette Mills, NP  REFERRING DIAG:  M54.12 (ICD-10-CM) - Radiculopathy, cervical region    RATIONALE FOR EVALUATION AND TREATMENT:  Rehabilitation  THERAPY DIAG: Radiculopathy, cervical region  Cervicalgia  ONSET DATE: Chronic Neck pain   FOLLOW-UP APPT SCHEDULED WITH REFERRING PROVIDER: Yes April 24th    SUBJECTIVE:                                                                                                                                                                                         Chief Complaint: Neck pain   Pertinent History Pt reports pain that starts in the neck travels into the  shoulder, elbow and stops at the fingers. Neck pain that radiates to both of his arms (R > L). Pt reports that he has been advised of bulging discs existing at multiple cervical levels. He has a hard time sleeping at night due to the pain. Aggravations include repetitive UE use, over head motions. Patient states that he currently takes oxycodone and tylenol  for pain relief. 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 The reports that he had a discussion with referring provider regarding cervical surgery in order to reduce pain but is willing to try OPPT in order to maximize return to PLOF. The patient reports numbness and tingling starting from posterior shoulder referring distally to the 4th and 5th digits. He has noticed his grip strength in his R hand has started becoming weaker in comparison to LUE. Denies MOI, facial weakness, and visual changes.   Dominant hand: right  Imaging (Per Chart Review):  CT CERVICAL SPINE FINDINGS  Alignment: Normal cervical lordosis.   Skull base and vertebrae: No acute fracture. No primary bone lesion or focal pathologic process.   Soft tissues and spinal canal: No prevertebral fluid or swelling. No visible canal hematoma.   Disc levels: Status post PLIF at C6-7. Mild degenerative changes, most prominent at C5-6. Spinal canal is patent.   Upper chest: Visualized lung apices are clear.   Other: Visualized thyroid  is unremarkable.    IMPRESSION: Normal head CT.   No traumatic injury to the cervical spine. Status post PLIF at C6-7. Mild degenerative changes.  Pain:  Pain Intensity: Present:4 /10, Best: 4/10, Worst: 10/10 Pain location: Bilateral Neck, Shoulder and Wrist (L > R)   Pain Quality: constant, tingling, and aching  Radiating: Yes  Numbness/Tingling: Yes Focal Weakness: No History of prior neck injury, pain, surgery, or therapy: No Red flags (personal history of cancer, h/o spinal tumors, history of compression fracture, chills/fever, night sweats, nausea, vomiting, unrelenting pain): Negative  PRECAUTIONS: Fall  WEIGHT BEARING RESTRICTIONS: No  FALLS: Has patient fallen in last 6 months? No  Living Environment Lives with: lives with their spouse Lives in: House/apartment Stairs: Yes: Internal: 14 steps; on left going up Has following equipment at home: None  Prior level of function: Independent  Occupational demands: Passenger transport manager   Hobbies: Watch TV, Hands-On Tasks, Drawing   Patient Goals: Reduce Pain, Improve Cervical ROM and increase grip strength.    OBJECTIVE:   Patient Surveys: NDI: Deferred to next session    Cognition Patient is oriented to person, place, and time.  Recent memory is intact.  Attention span and concentration are intact.  Expressive speech is intact.     Gross Musculoskeletal Assessment Tremor: None Bulk: Normal Tone: Normal  Gait Deferred   Posture Rounded Posture, Forward Posture   AROM AROM (Normal range in degrees) AROM  Cervical  Flexion (50) 40  Extension (80) WNL  Right lateral flexion (45) WNL *   Left lateral flexion (45) WNL   Right rotation (85) 40  Left rotation (85) 45  (* = pain; Blank rows = not tested)  UPPER EXTREMITY ROM:  Active ROM Right eval Left eval  Shoulder flexion 145 160  Shoulder extension    Shoulder abduction    Shoulder adduction    Shoulder extension    Shoulder internal rotation 60 *    Shoulder external rotation 50 *   Elbow flexion    Elbow extension    Wrist flexion    Wrist  extension    Wrist ulnar deviation    Wrist radial deviation    Wrist pronation    Wrist supination    (* = pain; Blank rows = not tested)  UPPER EXTREMITY MMT:  MMT Right eval Left eval  Shoulder flexion 4+ *  5  Shoulder extension    Shoulder abduction 4/5 *  5  Shoulder adduction    Shoulder extension    Shoulder internal rotation    Shoulder external rotation    Middle trapezius    Lower trapezius    Elbow flexion 5 5  Elbow extension 5 5  Wrist flexion 5 5  Wrist extension 5 5  Wrist ulnar deviation    Wrist radial deviation    Wrist pronation    Wrist supination    Grip strength      (* = pain; Blank rows = not tested)  MMT MMT (out of 5) Right Left  Cervical (isometric)  Flexion WNL  Extension WNL  Lateral Flexion WNL WNL  Rotation WNL WNL      (* = pain; Blank rows = not tested)  Sensation Grossly intact with light touch bilaterally.  Reflexes Not Tested   Palpation Location LEFT  RIGHT           Suboccipitals 0 0  Cervical paraspinals 0 0  Upper Trapezius 0 0  Levator Scapulae 0 0  Rhomboid Major/Minor 0 0  (Blank rows = not tested) Graded on 0-4 scale (0 = no pain, 1 = pain, 2 = pain with wincing/grimacing/flinching, 3 = pain with withdrawal, 4 = unwilling to allow palpation), (Blank rows = not tested)  Repeated Movements No centralization or peripheralization of symptoms with repeated cervical retraction.   Passive Accessory Intervertebral Motion Not Tested   SPECIAL TESTS Spurlings A (ipsilateral lateral flexion/axial compression): R: Positive L: Not Tested  ULTT Radial R: Positive for Pain   Clinical Prediction Rules  Diagnostic Cervical Radiculopathy ULTTa: Any one of the following: A) symptom reproduction; B) side-to-side difference >10 degrees in elbow extension; or C) with regard to involved/painful side: ipsilateral neck lateral  flexion decreases symptoms and/or contralateral neck lateral flexion increases symptoms.  ROM: involved-side cervical rotation range of motion less than 60 degrees Distraction test: symptom reduction.  Spurling's A: symptom reproduction.   Number of Positive Criteria  Sensitivity  Specificity  Pos LR  Neg LR   Two  0.39 0.56 0.88 1.09   Three  0.39  0.94  6.1 0.65   Four  0.24 0.99  30.3 0.77   Pos LR = positive likelihood ratio. Neg LR = negative likelihood ratio.    TODAY'S TREATMENT:  03/08/2024    Subjective: Patient reports 3/10 pain in the R neck and shoulder. Pt reports that he continues to drop items randomly at work. Pt reports dropping a metal plate in assembly; muscles in his hand randomly became weak. Pain still consistent without only temporary relief following PT. No questions or concerns.   Neck surgeries were in 2007, 2013.  Going to do a 3rd neck surgery which pt is currently waiting on scheduling: C4-C6 cervical fusion.   Usually lifts 35-40 lbs at work.    Thereapeutic Exercise:  OMEGA Cable Column Shoulder Mid Row with Cervical Retraction (Seated) 2 x 12, 15#   1 x 12, 20#    Standing Lat Pull Down   3 x 12, 20#    Standing Shoulder High Row (Face Pull)    2 x 12, 15#  Seated Cervical Retractions against Blue Ball (on wall)  2 x 12 x 2 hold   Standing Shoulder Horizontal Abduction against resistance   2 x 12, Black TB  Supination/Pronation against hammer with 3# AW attached to distal end   1 x 8 ea direction, short lever   2 x 8 ea direction, long lever (VC for grip at bottom of the hammer)    Neuromuscular re-education  Seated R first rib self mobilization with strap    2 x 10, R/L Cervical Rotation, L Sidebending and Extension   Seated Thoracic Extension for thoracic mobility, pain modulation and decrease parasthesia   1 x 10   Supine Thoracic Extension on Yoga Foam Roller for thoracic mobility and pain modulation   2 x 10, VC for increased  thoracic extension  PATIENT EDUCATION:  Education details: HEP, POC  Person educated: Patient Education method: Explanation, Demonstration, and Handouts Education comprehension: verbalized understanding, returned demonstration, and verbal cues required   HOME EXERCISE PROGRAM:   Access Code: [email or web address] URL: [email or web address] Date: 02/17/2024 Prepared by:   Exercises - Seated Cervical Retraction  - 1 x daily - 7 x weekly - 3 sets - 10-12 reps - 5s hold - Shoulder External Rotation and Scapular Retraction with Resistance  - 1 x daily - 3-4 x weekly - 2-3 sets - 10-12 reps - Standing Radial Nerve Glide  - 1 x daily - 7 x weekly - 3 sets - 10-12 reps - Standing Shoulder Row with Anchored Resistance  - 1 x daily - 7 x weekly - 3 sets - 10-12 reps - Seated Upper Trapezius Stretch  - 1 x daily - 7 x weekly - 3 sets - 30-60 hold - Seated Levator Scapulae Stretch  - 1 x daily - 7 x weekly - 3 sets - 30-60 hold   Seated first rib stretch with strap   With cervical R and L rotation    R 10x3. Decreased R posterior lateral cervical discomfort with R and L cervical rotation afterwards   L 10x3  - Supine Cervical Rotation AROM on Pillow  - 1 x daily - 7 x weekly - 3 sets - 10 reps - Supine Shoulder Flexion Extension Full Range AROM  - 1 x daily - 7 x weekly - 3 sets - 10 reps - 5 seconds hold  02/14/2024 Drawing handout [facility])  Seated first rib stretch with strap   With cervical R and L rotation    R 10x3. Decreased R posterior lateral cervical discomfort with R and L cervical rotation afterwards   L 10x3    Access Code: [email or web address] URL: [email or web address] Date: 02/01/2024 Prepared by: Veryl Gottron Rande Roylance  Exercises - Seated Cervical Retraction  - 1 x daily - 7 x weekly - 3 sets - 10-12 reps - 5s hold - Shoulder External Rotation and Scapular Retraction with Resistance  - 1 x daily - 3-4 x weekly - 2-3 sets - 10-12 reps - Standing Radial Nerve  Glide  - 1 x daily - 7 x weekly - 3 sets - 10-12 reps - Standing Shoulder Row with Anchored Resistance  - 1 x daily - 7 x weekly - 3 sets - 10-12 reps - Seated Upper Trapezius Stretch  - 1 x daily - 7 x weekly - 3 sets - 30-60 hold - Seated Levator Scapulae Stretch  - 1 x daily - 7 x weekly - 3 sets - 30-60 hold  ASSESSMENT:  CLINICAL IMPRESSION: Continued PT POC focused on improving  cervicothoracic pain and postural awareness. Pt continues to endorse only temporary relief from following PT session with worsening symptoms throughout the day. He still continues to have sporadic moments of hand grip weakness and endorsed that he dropped a metal plate while working yesterday. He tolerated all interventions today without report of additional pain. End of session patient endorsed only a minor improvement in R neck shoulder pain. Patient's pain without improvement since initial visit and he has consistently reported radiating pain from his neck into his shoulder.  PT encouraged adherence to HEP.  Pt will continue to benefit from skilled PT in order to address current deficits and improve QoL.   OBJECTIVE IMPAIRMENTS: decreased ROM, decreased strength, impaired sensation, and pain.   ACTIVITY LIMITATIONS: carrying, lifting, sleeping, and reach over head  PARTICIPATION LIMITATIONS: driving, occupation, and yard work  PERSONAL FACTORS: Age, Profession, and Time since onset of injury/illness/exacerbation are also affecting patient's functional outcome.   REHAB POTENTIAL: Fair Patient is motivated to maximize return to PLOF but he has had numerous ESI with little relief. Referring provider's impression on recent imaging suggest that osteophyte and degeneration of multiple vertebral spaces indicate surgical interventions.   CLINICAL DECISION MAKING: Evolving/moderate complexity  EVALUATION COMPLEXITY: Moderate   GOALS: Goals reviewed with patient? No  SHORT TERM GOALS: Target date: 04/19/2024  Pt  will be independent with HEP to improve strength and decrease neck pain to improve pain-free function at home and work. Baseline: n/a Goal status: INITIAL   LONG TERM GOALS: Target date: 05/31/2024  Pt will improve right-hand grip strength by at least within 10% of the left hand's current grip strength via hand dynamometer in order to demonstrate improvements in hand strength and functional tasks such as gripping tools at work.  Baseline: n/a  Goal status: Deferred  2.  Pt will decrease worst neck pain by at least 2 points on the NPRS in order to demonstrate clinically significant reduction in neck pain. Baseline: 01/25/2024: 10/10 Goal status: INITIAL  3.  Pt will decrease NDI score by at least 19% in order demonstrate clinically significant reduction in neck pain/disability.       Baseline: 11 / 50 = 22.0 % Goal status: INITIAL  4. Pt will report improvements in sleep ( > 6 hours) with minimal pain ( < 3/10 NPS) and/or less sleep disruptions (< 2-3) in order to demonstrate clinically significant reduction in neck pain.  Baseline: 03/06/2024: 4 hours a night Goal status: Progressing      PLAN: PT FREQUENCY: 1-2x/week  PT DURATION: 12 weeks  PLANNED INTERVENTIONS: Therapeutic exercises, Therapeutic activity, Neuromuscular re-education, Balance training, Gait training, Patient/Family education, Self Care, Joint mobilization, Joint manipulation, DME instructions, Dry Needling, Electrical stimulation, Spinal manipulation, Spinal mobilization, Cryotherapy, Moist heat, Taping, Traction, Ultrasound, Manual therapy, and Re-evaluation.  PLAN FOR NEXT SESSION:  progress postural strengthening, cervical retractions exercises, manual therapy PRN  Satira Curet PT, DPT Physical Therapist- [facility]  [facility]  03/08/2024, 12:17 PM

## 2024-03-13 ENCOUNTER — Encounter

## 2024-03-14 ENCOUNTER — Ambulatory Visit

## 2024-03-14 DIAGNOSIS — M5412 Radiculopathy, cervical region: Secondary | ICD-10-CM

## 2024-03-14 DIAGNOSIS — M542 Cervicalgia: Secondary | ICD-10-CM

## 2024-03-14 NOTE — Therapy (Signed)
 OUTPATIENT PHYSICAL THERAPY NECK TREATMENT/DISCHARGE SUMMARY   Patient Name: Eric Owen. MRN: [id number] DOB:[birth year], 58 y.o., male Today's Date: 03/14/2024  END OF SESSION:  PT End of Session - 03/14/24 0902     Visit Number 9    Number of Visits 25    Date for PT Re-Evaluation 04/18/24    Authorization Type [employer] [id number]    Authorization - Number of Visits 60    Progress Note Due on Visit 10    PT Start Time 0902    PT Stop Time 0942    PT Time Calculation (min) 40 min    Activity Tolerance Patient tolerated treatment well    Behavior During Therapy WFL for tasks assessed/performed                 Past Medical History:  Diagnosis Date   Arthritis    Blood transfusion    GERD (gastroesophageal reflux disease)    Hypertension    Peptic ulcer    Past Surgical History:  Procedure Laterality Date   BUNIONECTOMY     CARPAL TUNNEL RELEASE     right   CERVICAL FUSION     COLONOSCOPY N/A 03/04/2015   Procedure: COLONOSCOPY;  Surgeon: Jerlean Mood, MD;  Location: [facility];  Service: Endoscopy;  Laterality: N/A;   HAMMER TOE SURGERY     POSTERIOR CERVICAL FUSION/FORAMINOTOMY  03/16/2012   Procedure: POSTERIOR CERVICAL FUSION/FORAMINOTOMY LEVEL 1;  Surgeon: Isadora Mar, MD;  Location: [facility];  Service: Neurosurgery;  Laterality: N/A;  Cervical six-seven posterior cervical fusion with lateral mass screws   Patient Active Problem List   Diagnosis Date Noted   Encounter for preventive care 09/14/2022   Type 2 diabetes mellitus without complication, without long-term current use of insulin (HCC) 08/12/2020   Mixed dyslipidemia 05/13/2020   Tobacco abuse counseling 05/07/2020   Essential hypertension    GERD (gastroesophageal reflux disease)    Arthritis     PCP: Theron Flavin, MD  REFERRING PROVIDER: Jeannette Mills, NP  REFERRING DIAG:  M54.12 (ICD-10-CM) - Radiculopathy, cervical region    RATIONALE FOR EVALUATION AND  TREATMENT: Rehabilitation  THERAPY DIAG: Radiculopathy, cervical region  Cervicalgia  ONSET DATE: Chronic Neck pain   FOLLOW-UP APPT SCHEDULED WITH REFERRING PROVIDER: Yes April 24th    SUBJECTIVE:                                                                                                                                                                                         Chief Complaint: Neck pain   Pertinent History Pt reports pain that starts in the neck travels into  the shoulder, elbow and stops at the fingers. Neck pain that radiates to both of his arms (R > L). Pt reports that he has been advised of bulging discs existing at multiple cervical levels. He has a hard time sleeping at night due to the pain. Aggravations include repetitive UE use, over head motions. Patient states that he currently takes oxycodone and tylenol  for pain relief. 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 The reports that he had a discussion with referring provider regarding cervical surgery in order to reduce pain but is willing to try OPPT in order to maximize return to PLOF. The patient reports numbness and tingling starting from posterior shoulder referring distally to the 4th and 5th digits. He has noticed his grip strength in his R hand has started becoming weaker in comparison to LUE. Denies MOI, facial weakness, and visual changes.   Dominant hand: right  Imaging (Per Chart Review):  CT CERVICAL SPINE FINDINGS  Alignment: Normal cervical lordosis.   Skull base and vertebrae: No acute fracture. No primary bone lesion or focal pathologic process.   Soft tissues and spinal canal: No prevertebral fluid or swelling. No visible canal hematoma.   Disc levels: Status post PLIF at C6-7. Mild degenerative changes, most prominent at C5-6. Spinal canal is patent.   Upper chest: Visualized lung apices are clear.   Other: Visualized thyroid  is unremarkable.    IMPRESSION: Normal head CT.   No traumatic injury to the cervical spine. Status post PLIF at C6-7. Mild degenerative changes.  Pain:  Pain Intensity: Present:4 /10, Best: 4/10, Worst: 10/10 Pain location: Bilateral Neck, Shoulder and Wrist (L > R)   Pain Quality: constant, tingling, and aching  Radiating: Yes  Numbness/Tingling: Yes Focal Weakness: No History of prior neck injury, pain, surgery, or therapy: No Red flags (personal history of cancer, h/o spinal tumors, history of compression fracture, chills/fever, night sweats, nausea, vomiting, unrelenting pain): Negative  PRECAUTIONS: Fall  WEIGHT BEARING RESTRICTIONS: No  FALLS: Has patient fallen in last 6 months? No  Living Environment Lives with: lives with their spouse Lives in: House/apartment Stairs: Yes: Internal: 14 steps; on left going up Has following equipment at home: None  Prior level of function: Independent  Occupational demands: Passenger transport manager   Hobbies: Watch TV, Hands-On Tasks, Drawing   Patient Goals: Reduce Pain, Improve Cervical ROM and increase grip strength.    OBJECTIVE:   Patient Surveys: NDI: Deferred to next session    Cognition Patient is oriented to person, place, and time.  Recent memory is intact.  Attention span and concentration are intact.  Expressive speech is intact.     Gross Musculoskeletal Assessment Tremor: None Bulk: Normal Tone: Normal  Gait Deferred   Posture Rounded Posture, Forward Posture   AROM AROM (Normal range in degrees) AROM  Cervical  Flexion (50) 40  Extension (80) WNL  Right lateral flexion (45) WNL *   Left lateral flexion (45) WNL   Right rotation (85) 40   Left rotation (85) 45   (* = pain; Blank rows = not tested)  UPPER EXTREMITY ROM:  Active ROM Right eval Left eval  Shoulder flexion 145 160  Shoulder extension    Shoulder abduction    Shoulder adduction    Shoulder extension    Shoulder internal rotation 60 *    Shoulder external rotation 50 *   Elbow flexion    Elbow extension    Wrist flexion  Wrist extension    Wrist ulnar deviation    Wrist radial deviation    Wrist pronation    Wrist supination    (* = pain; Blank rows = not tested)  UPPER EXTREMITY MMT:  MMT Right eval Left eval  Shoulder flexion 4+ *  5  Shoulder extension    Shoulder abduction 4/5 *  5  Shoulder adduction    Shoulder extension    Shoulder internal rotation    Shoulder external rotation    Middle trapezius    Lower trapezius    Elbow flexion 5 5  Elbow extension 5 5  Wrist flexion 5 5  Wrist extension 5 5  Wrist ulnar deviation    Wrist radial deviation    Wrist pronation    Wrist supination    Grip strength      (* = pain; Blank rows = not tested)  MMT MMT (out of 5) Right Left  Cervical (isometric)  Flexion WNL  Extension WNL  Lateral Flexion WNL WNL  Rotation WNL WNL      (* = pain; Blank rows = not tested)  Sensation Grossly intact with light touch bilaterally.  Reflexes Not Tested   Palpation Location LEFT  RIGHT           Suboccipitals 0 0  Cervical paraspinals 0 0  Upper Trapezius 0 0  Levator Scapulae 0 0  Rhomboid Major/Minor 0 0  (Blank rows = not tested) Graded on 0-4 scale (0 = no pain, 1 = pain, 2 = pain with wincing/grimacing/flinching, 3 = pain with withdrawal, 4 = unwilling to allow palpation), (Blank rows = not tested)  Repeated Movements No centralization or peripheralization of symptoms with repeated cervical retraction.   Passive Accessory Intervertebral Motion Not Tested   SPECIAL TESTS Spurlings A (ipsilateral lateral flexion/axial compression): R: Positive L: Not Tested  ULTT Radial R: Positive for Pain   Clinical Prediction Rules  Diagnostic Cervical Radiculopathy ULTTa: Any one of the following: A) symptom reproduction; B) side-to-side difference >10 degrees in elbow extension; or C) with regard to involved/painful side: ipsilateral neck lateral  flexion decreases symptoms and/or contralateral neck lateral flexion increases symptoms.  ROM: involved-side cervical rotation range of motion less than 60 degrees Distraction test: symptom reduction.  Spurling's A: symptom reproduction.   Number of Positive Criteria  Sensitivity  Specificity  Pos LR  Neg LR   Two  0.39 0.56 0.88 1.09   Three  0.39  0.94  6.1 0.65   Four  0.24 0.99  30.3 0.77   Pos LR = positive likelihood ratio. Neg LR = negative likelihood ratio.    TODAY'S TREATMENT:  03/13/2024    Subjective: Patient reports 6/10 pain in the R neck and shoulder throughout the weekend; pt reports that he had difficulty sleeping. Patient still consistently dropping items. Patient not seeing any improvements in pain; per physician request pt would like to discharge from PT.  No questions or concerns.   Neck surgeries were in 2007, 2013.  Going to do a 3rd neck surgery which pt is currently waiting on scheduling: C4-C6 cervical fusion.   Usually lifts 35-40 lbs at work.  Therapeutic Exercise:  Review HEP with pt return demonstration:     Seated Cervical Retractions:     2 x 10 x 3s     First Rib Mobilization with Strap     2 x 10  x 3s     Seated External Rotation with Scapular Retraction  2 x 12, Blue TB     Upper Trapezius Stretch     30s/bout x 2 bouts in order to improve pain and tissue extensibility     Upper Levator Stretch    30s/bout x 2 in order to improve ROM and tissue extensibility    Standing Shoulder Row with Scapular Retraction    2 x 12, Black TB    Omega Cable Column  Seated Lat Pull Down (Cable Column)    2 x 12, 25#   Seated Shoulder Row    2 x 12, 25#   Reassessment of ROM:    R Cervical Rotation - 50   L Cervical Rotation -  60   PATIENT EDUCATION:  Education details: HEP, POC  Person educated: Patient Education method: Explanation, Demonstration, and Handouts Education comprehension: verbalized understanding, returned  demonstration, and verbal cues required   HOME EXERCISE PROGRAM:   Access Code: [email or web address] URL: [email or web address] Date: 02/17/2024 Prepared by:   Exercises - Seated Cervical Retraction  - 1 x daily - 7 x weekly - 3 sets - 10-12 reps - 5s hold - Shoulder External Rotation and Scapular Retraction with Resistance  - 1 x daily - 3-4 x weekly - 2-3 sets - 10-12 reps - Standing Radial Nerve Glide  - 1 x daily - 7 x weekly - 3 sets - 10-12 reps - Standing Shoulder Row with Anchored Resistance  - 1 x daily - 7 x weekly - 3 sets - 10-12 reps - Seated Upper Trapezius Stretch  - 1 x daily - 7 x weekly - 3 sets - 30-60 hold - Seated Levator Scapulae Stretch  - 1 x daily - 7 x weekly - 3 sets - 30-60 hold   Seated first rib stretch with strap   With cervical R and L rotation    R 10x3. Decreased R posterior lateral cervical discomfort with R and L cervical rotation afterwards   L 10x3  - Supine Cervical Rotation AROM on Pillow  - 1 x daily - 7 x weekly - 3 sets - 10 reps - Supine Shoulder Flexion Extension Full Range AROM  - 1 x daily - 7 x weekly - 3 sets - 10 reps - 5 seconds hold  02/14/2024 Drawing handout [facility])  Seated first rib stretch with strap   With cervical R and L rotation    R 10x3. Decreased R posterior lateral cervical discomfort with R and L cervical rotation afterwards   L 10x3    Access Code: [email or web address] URL: [email or web address] Date: 02/01/2024 Prepared by: Veryl Gottron Modest Draeger  Exercises - Seated Cervical Retraction  - 1 x daily - 7 x weekly - 3 sets - 10-12 reps - 5s hold - Shoulder External Rotation and Scapular Retraction with Resistance  - 1 x daily - 3-4 x weekly - 2-3 sets - 10-12 reps - Standing Radial Nerve Glide  - 1 x daily - 7 x weekly - 3 sets - 10-12 reps - Standing Shoulder Row with Anchored Resistance  - 1 x daily - 7 x weekly - 3 sets - 10-12 reps - Seated Upper Trapezius Stretch  - 1 x daily - 7 x weekly - 3 sets -  30-60 hold - Seated Levator Scapulae Stretch  - 1 x daily - 7 x weekly - 3 sets - 30-60 hold  ASSESSMENT:  CLINICAL IMPRESSION: Patient agreeable to d/c from OPPT due to no improvements in pain, consistent weakness in grip and radicular symptoms. Patient pain with minor  improvements since initial visit (10/10 NPS to 6/10 NPS) and consistent disrupts his normal sleep cycle. Although patient has demonstrated improvements in parascapular strength and ROM, pt endorses grip weakness which intermittently affects work-related tasks and household ADLs. According to self reported NDI (See below), pt still has minimal to moderate disability due to the pain in his R neck/shoulder. Although pain and disability are without improvements, pt did demonstrate improvements in cervical rotation AROM (See above) in today's session. Pt with only minimal relief at the end of session following mobilization and strengthening interventions. PT encouraged ideal frequency of HEP in order to mitigate radicular symptoms. PT believes pt no longer require skilled physical therapy interventions and are discharged with a home exercise program to maintain current progress. No question at end of session. Pt encouraged to follow-up with their primary care provider is recommended if any issues arise    OBJECTIVE IMPAIRMENTS: decreased ROM, decreased strength, impaired sensation, and pain.   ACTIVITY LIMITATIONS: carrying, lifting, sleeping, and reach over head  PARTICIPATION LIMITATIONS: driving, occupation, and yard work  PERSONAL FACTORS: Age, Profession, and Time since onset of injury/illness/exacerbation are also affecting patient's functional outcome.   REHAB POTENTIAL: Fair Patient is motivated to maximize return to PLOF but he has had numerous ESI with little relief. Referring provider's impression on recent imaging suggest that osteophyte and degeneration of multiple vertebral spaces indicate surgical interventions.    CLINICAL DECISION MAKING: Evolving/moderate complexity  EVALUATION COMPLEXITY: Moderate   GOALS: Goals reviewed with patient? No  SHORT TERM GOALS: Target date: 04/25/2024  Pt will be independent with HEP to improve strength and decrease neck pain to improve pain-free function at home and work. Baseline: n/a 03/14/2024: Performs cervical retractions and stretches daily  Goal status: MET   LONG TERM GOALS: Target date: 06/06/2024  Pt will improve right-hand grip strength by at least within 10% of the left hand's current grip strength via hand dynamometer in order to demonstrate improvements in hand strength and functional tasks such as gripping tools at work.  Baseline: n/a  Goal status: Deferred  2.  Pt will decrease worst neck pain by at least 2 points on the NPRS in order to demonstrate clinically significant reduction in neck pain. Baseline: 01/25/2024: 10/10; 03/14/2024: 6/10 Goal status: MET  3.  Pt will decrease NDI score by at least 19% in order demonstrate clinically significant reduction in neck pain/disability.       Baseline: 11 / 50 = 22.0 %; 03/14/2024:  14 / 50 = 28.0 % Goal status: NOT MET  4. Pt will report improvements in sleep ( > 6 hours) with minimal pain ( < 3/10 NPS) and/or less sleep disruptions (< 2-3) in order to demonstrate clinically significant reduction in neck pain.  Baseline: 03/06/2024: 4 hours a night; 03/14/2024: 4 Hr/night with neck pain Goal status: NOT MET       PLAN: PT FREQUENCY: 1-2x/week  PT DURATION: 12 weeks  PLANNED INTERVENTIONS: Therapeutic exercises, Therapeutic activity, Neuromuscular re-education, Balance training, Gait training, Patient/Family education, Self Care, Joint mobilization, Joint manipulation, DME instructions, Dry Needling, Electrical stimulation, Spinal manipulation, Spinal mobilization, Cryotherapy, Moist heat, Taping, Traction, Ultrasound, Manual therapy, and Re-evaluation.  PLAN FOR NEXT SESSION:   D/c  Satira Curet PT, DPT Physical Therapist- [place]  [facility]  03/14/2024, 9:44 AM

## 2024-03-15 ENCOUNTER — Encounter

## 2024-03-19 ENCOUNTER — Ambulatory Visit

## 2024-03-21 ENCOUNTER — Ambulatory Visit

## 2024-04-06 ENCOUNTER — Other Ambulatory Visit: Payer: Self-pay | Admitting: Neurological Surgery

## 2024-04-27 NOTE — Pre-Procedure Instructions (Signed)
 Surgical Instructions   Your procedure is scheduled on Wednesday, July 23rd. Report to [facility] Main Entrance A at 05:30 A.M., then check in with the Admitting office. Any questions or running late day of surgery: call [phone]  Questions prior to your surgery date: call [phone], Monday-Friday, 8am-4pm. If you experience any cold or flu symptoms such as cough, fever, chills, shortness of breath, etc. between now and your scheduled surgery, please notify us  at the above number.     Remember:  Do not eat or drink after midnight the night before your surgery    Take these medicines the morning of surgery with A SIP OF WATER  May take these medicines IF NEEDED: HYDROcodone -acetaminophen  (NORCO)  oxyCODONE-acetaminophen  (PERCOCET)   One week prior to surgery, STOP taking any Aspirin (unless otherwise instructed by your surgeon) Aleve, Naproxen, Ibuprofen, Motrin, Advil, Goody's, BC's, all herbal medications, fish oil, and non-prescription vitamins.    HOW TO MANAGE YOUR DIABETES BEFORE AND AFTER SURGERY  Why is it important to control my blood sugar before and after surgery? Improving blood sugar levels before and after surgery helps healing and can limit problems. A way of improving blood sugar control is eating a healthy diet by:  Eating less sugar and carbohydrates  Increasing activity/exercise  Talking with your doctor about reaching your blood sugar goals High blood sugars (greater than 180 mg/dL) can raise your risk of infections and slow your recovery, so you will need to focus on controlling your diabetes during the weeks before surgery. Make sure that the doctor who takes care of your diabetes knows about your planned surgery including the date and location.  How do I manage my blood sugar before surgery? Check your blood sugar at least 4 times a day, starting 2 days before surgery, to make sure that the level is not too high or low.  Check your blood sugar the  morning of your surgery when you wake up and every 2 hours until you get to the Short Stay unit.  If your blood sugar is less than 70 mg/dL, you will need to treat for low blood sugar: Do not take insulin. Treat a low blood sugar (less than 70 mg/dL) with  cup of clear juice (cranberry or apple), 4 glucose tablets, OR glucose gel. Recheck blood sugar in 15 minutes after treatment (to make sure it is greater than 70 mg/dL). If your blood sugar is not greater than 70 mg/dL on recheck, call [phone] for further instructions. Report your blood sugar to the short stay nurse when you get to Short Stay.  If you are admitted to the hospital after surgery: Your blood sugar will be checked by the staff and you will probably be given insulin after surgery (instead of oral diabetes medicines) to make sure you have good blood sugar levels. The goal for blood sugar control after surgery is 80-180 mg/dL.                     Do NOT Smoke (Tobacco/Vaping) for 24 hours prior to your procedure.  If you use a CPAP at night, you may bring your mask/headgear for your overnight stay.   You will be asked to remove any contacts, glasses, piercing's, hearing aid's, dentures/partials prior to surgery. Please bring cases for these items if needed.    Patients discharged the day of surgery will not be allowed to drive home, and someone needs to stay with them for 24 hours.  SURGICAL WAITING ROOM  VISITATION Patients may have no more than 2 support people in the waiting area - these visitors may rotate.   Pre-op nurse will coordinate an appropriate time for 1 ADULT support person, who may not rotate, to accompany patient in pre-op.  Children under the age of 84 must have an adult with them who is not the patient and must remain in the main waiting area with an adult.  If the patient needs to stay at the hospital during part of their recovery, the visitor guidelines for inpatient rooms apply.  Please refer to the  [facility] website for the visitor guidelines for any additional information.   If you received a COVID test during your pre-op visit  it is requested that you wear a mask when out in public, stay away from anyone that may not be feeling well and notify your surgeon if you develop symptoms. If you have been in contact with anyone that has tested positive in the last 10 days please notify you surgeon.      Pre-operative 5 CHG Bathing Instructions   You can play a key role in reducing the risk of infection after surgery. Your skin needs to be as free of germs as possible. You can reduce the number of germs on your skin by washing with CHG (chlorhexidine  gluconate) soap before surgery. CHG is an antiseptic soap that kills germs and continues to kill germs even after washing.   DO NOT use if you have an allergy to chlorhexidine /CHG or antibacterial soaps. If your skin becomes reddened or irritated, stop using the CHG and notify one of our RNs at [phone].   Please shower with the CHG soap starting 4 days before surgery using the following schedule:     Please keep in mind the following:  DO NOT shave, including legs and underarms, starting the day of your first shower.   You may shave your face at any point before/day of surgery.  Place clean sheets on your bed the day you start using CHG soap. Use a clean washcloth (not used since being washed) for each shower. DO NOT sleep with pets once you start using the CHG.   CHG Shower Instructions:  Wash your face and private area with normal soap. If you choose to wash your hair, wash first with your normal shampoo.  After you use shampoo/soap, rinse your hair and body thoroughly to remove shampoo/soap residue.  Turn the water OFF and apply about 3 tablespoons (45 ml) of CHG soap to a CLEAN washcloth.  Apply CHG soap ONLY FROM YOUR NECK DOWN TO YOUR TOES (washing for 3-5 minutes)  DO NOT use CHG soap on face, private areas, open wounds, or  sores.  Pay special attention to the area where your surgery is being performed.  If you are having back surgery, having someone wash your back for you may be helpful. Wait 2 minutes after CHG soap is applied, then you may rinse off the CHG soap.  Pat dry with a clean towel  Put on clean clothes/pajamas   If you choose to wear lotion, please use ONLY the CHG-compatible lotions that are listed below.  Additional instructions for the day of surgery: DO NOT APPLY any lotions, deodorants, cologne, or perfumes.   Do not bring valuables to the hospital. [facility] is not responsible for any belongings/valuables. Do not wear nail polish, gel polish, artificial nails, or any other type of covering on natural nails (fingers and toes) Do not wear jewelry  or makeup Put on clean/comfortable clothes.  Please brush your teeth.  Ask your nurse before applying any prescription medications to the skin.     CHG Compatible Lotions   Aveeno Moisturizing lotion  Cetaphil Moisturizing Cream  Cetaphil Moisturizing Lotion  Clairol Herbal Essence Moisturizing Lotion, Dry Skin  Clairol Herbal Essence Moisturizing Lotion, Extra Dry Skin  Clairol Herbal Essence Moisturizing Lotion, Normal Skin  Curel Age Defying Therapeutic Moisturizing Lotion with Alpha Hydroxy  Curel Extreme Care Body Lotion  Curel Soothing Hands Moisturizing Hand Lotion  Curel Therapeutic Moisturizing Cream, Fragrance-Free  Curel Therapeutic Moisturizing Lotion, Fragrance-Free  Curel Therapeutic Moisturizing Lotion, Original Formula  Eucerin Daily Replenishing Lotion  Eucerin Dry Skin Therapy Plus Alpha Hydroxy Crme  Eucerin Dry Skin Therapy Plus Alpha Hydroxy Lotion  Eucerin Original Crme  Eucerin Original Lotion  Eucerin Plus Crme Eucerin Plus Lotion  Eucerin TriLipid Replenishing Lotion  Keri Anti-Bacterial Hand Lotion  Keri Deep Conditioning Original Lotion Dry Skin Formula Softly Scented  Keri Deep Conditioning Original  Lotion, Fragrance Free Sensitive Skin Formula  Keri Lotion Fast Absorbing Fragrance Free Sensitive Skin Formula  Keri Lotion Fast Absorbing Softly Scented Dry Skin Formula  Keri Original Lotion  Keri Skin Renewal Lotion Keri Silky Smooth Lotion  Keri Silky Smooth Sensitive Skin Lotion  Nivea Body Creamy Conditioning Oil  Nivea Body Extra Enriched Lotion  Nivea Body Original Lotion  Nivea Body Sheer Moisturizing Lotion Nivea Crme  Nivea Skin Firming Lotion  NutraDerm 30 Skin Lotion  NutraDerm Skin Lotion  NutraDerm Therapeutic Skin Cream  NutraDerm Therapeutic Skin Lotion  ProShield Protective Hand Cream  Provon moisturizing lotion  Please read over the following fact sheets that you were given.

## 2024-04-30 ENCOUNTER — Encounter (HOSPITAL_COMMUNITY): Payer: Self-pay | Admitting: Physician Assistant

## 2024-04-30 ENCOUNTER — Other Ambulatory Visit: Payer: Self-pay

## 2024-04-30 ENCOUNTER — Encounter (HOSPITAL_COMMUNITY)
Admission: RE | Admit: 2024-04-30 | Discharge: 2024-04-30 | Disposition: A | Discharge status: Home / Self Care | Source: Ambulatory Visit | Attending: Neurological Surgery | Admitting: Neurological Surgery

## 2024-04-30 ENCOUNTER — Encounter (HOSPITAL_COMMUNITY): Payer: Self-pay

## 2024-04-30 VITALS — BP 159/92 | HR 68 | Temp 98.2°F | Resp 17 | Ht 72.0 in | Wt 190.9 lb

## 2024-04-30 DIAGNOSIS — Z91128 Patient's intentional underdosing of medication regimen for other reason: Secondary | ICD-10-CM | POA: Insufficient documentation

## 2024-04-30 DIAGNOSIS — Z01818 Encounter for other preprocedural examination: Secondary | ICD-10-CM | POA: Insufficient documentation

## 2024-04-30 DIAGNOSIS — M5412 Radiculopathy, cervical region: Secondary | ICD-10-CM | POA: Insufficient documentation

## 2024-04-30 DIAGNOSIS — I10 Essential (primary) hypertension: Secondary | ICD-10-CM | POA: Insufficient documentation

## 2024-04-30 DIAGNOSIS — K219 Gastro-esophageal reflux disease without esophagitis: Secondary | ICD-10-CM | POA: Insufficient documentation

## 2024-04-30 DIAGNOSIS — Z01812 Encounter for preprocedural laboratory examination: Secondary | ICD-10-CM | POA: Diagnosis present

## 2024-04-30 DIAGNOSIS — Z01810 Encounter for preprocedural cardiovascular examination: Secondary | ICD-10-CM | POA: Diagnosis present

## 2024-04-30 DIAGNOSIS — T465X6A Underdosing of other antihypertensive drugs, initial encounter: Secondary | ICD-10-CM | POA: Insufficient documentation

## 2024-04-30 DIAGNOSIS — E119 Type 2 diabetes mellitus without complications: Secondary | ICD-10-CM

## 2024-04-30 DIAGNOSIS — T383X6A Underdosing of insulin and oral hypoglycemic [antidiabetic] drugs, initial encounter: Secondary | ICD-10-CM | POA: Diagnosis not present

## 2024-04-30 DIAGNOSIS — E1165 Type 2 diabetes mellitus with hyperglycemia: Secondary | ICD-10-CM | POA: Insufficient documentation

## 2024-04-30 DIAGNOSIS — Z87891 Personal history of nicotine dependence: Secondary | ICD-10-CM | POA: Insufficient documentation

## 2024-04-30 DIAGNOSIS — R9431 Abnormal electrocardiogram [ECG] [EKG]: Secondary | ICD-10-CM | POA: Diagnosis not present

## 2024-04-30 DIAGNOSIS — Z981 Arthrodesis status: Secondary | ICD-10-CM | POA: Diagnosis not present

## 2024-04-30 HISTORY — DX: Type 2 diabetes mellitus without complications: E11.9

## 2024-04-30 LAB — SURGICAL PCR SCREEN
MRSA, PCR: NEGATIVE
Staphylococcus aureus: NEGATIVE

## 2024-04-30 LAB — BASIC METABOLIC PANEL WITH GFR
Anion gap: 9 (ref 5–15)
BUN: 7 mg/dL (ref 6–20)
CO2: 27 mmol/L (ref 22–32)
Calcium: 8.9 mg/dL (ref 8.9–10.3)
Chloride: 99 mmol/L (ref 98–111)
Creatinine, Ser: 0.83 mg/dL (ref 0.61–1.24)
GFR, Estimated: >60 mL/min (ref >60)
Glucose, Bld: 189 mg/dL — ABNORMAL HIGH (ref 70–99)
Potassium: 4.1 mmol/L (ref 3.5–5.1)
Sodium: 135 mmol/L (ref 135–145)

## 2024-04-30 LAB — TYPE AND SCREEN
ABO/RH(D): O POS
Antibody Screen: NEGATIVE

## 2024-04-30 LAB — CBC
HCT: 38.2 % — ABNORMAL LOW (ref 39.0–52.0)
Hemoglobin: 13.8 g/dL (ref 13.0–17.0)
MCH: 32 pg (ref 26.0–34.0)
MCHC: 36.1 g/dL — ABNORMAL HIGH (ref 30.0–36.0)
MCV: 88.6 fL (ref 80.0–100.0)
Platelets: 226 K/uL (ref 150–400)
RBC: 4.31 MIL/uL (ref 4.22–5.81)
RDW: 11.9 % (ref 11.5–15.5)
WBC: 5.6 K/uL (ref 4.0–10.5)
nRBC: 0 % (ref 0.0–0.2)

## 2024-04-30 LAB — HEMOGLOBIN A1C
Hgb A1c MFr Bld: 8.5 % — ABNORMAL HIGH (ref 4.8–5.6)
Mean Plasma Glucose: 197.25 mg/dL

## 2024-04-30 LAB — GLUCOSE, CAPILLARY: Glucose-Capillary: 180 mg/dL — ABNORMAL HIGH (ref 70–99)

## 2024-04-30 NOTE — Progress Notes (Signed)
 PCP - Chelsea Aurora, NP Cardiologist - denies  PPM/ICD - denies   Chest x-ray - 09/13/17 EKG - 09/13/17 Stress Test - 04/28/11 ECHO - 11/24/21 Cardiac Cath - denies  Sleep Study - denies   DM- pt states that he rarely checks CBG, maybe once a month and does not know typical fasting level  Last dose of GLP1 agonist-  n/a   ASA/Blood Thinner Instructions: n/a   ERAS Protcol - no, NPO   COVID TEST- n/a   Anesthesia review: yes, pt states that he was told by Dr. Britta that he had a large heart. Pt has not seen Dr. Britta since 2023 since he retired. He has not f/u with a PCP since (no HTN meds or DM meds). Pt has upcoming PCP appt with Chelsea Aurora, NP on 05/25/24.  Patient denies shortness of breath, fever, cough and chest pain at PAT appointment   All instructions explained to the patient, with a verbal understanding of the material. Patient agrees to go over the instructions while at home for a better understanding.  The opportunity to ask questions was provided.

## 2024-05-01 NOTE — Progress Notes (Signed)
 Anesthesia Chart Review:  Case: [id number] Date/Time: 05/09/24 0715   Procedure: ANTERIOR CERVICAL DECOMPRESSION/DISCECTOMY FUSION 4 LEVELS - ACDF - C2-C3 - C3-C4 - C4-C5 - C5-C6   Anesthesia type: General   Diagnosis: Radiculopathy, cervical region [M54.12]   Pre-op diagnosis: Radiculopathy, cervical region   [facility] ROOM [facility] / [facility]   Surgeons: Joshua Alm Hamilton, MD       DISCUSSION: Patient is a 58 year old male scheduled for the above procedure.  History includes former smoker (quit 01/27/06), HTN, DM2, GERD, spinal surgery (C6-7 ACDF; s/p C6-7 posterior fusion 03/16/12).  He was followed by Dr. Britta, last on 09/14/22, but he is now retired. Mr. Jacquin is scheduled to get established with a new PCP [name], NP on 05/29/24. He is currently not taking fenofibrate , losartan , metformin . BP readings in [facility] at at [facility] since April 2024 have ranged from 143-158/78-92. He rarely checks home CBGs. A1c 8.5% on 04/30/24.   When he was followed by Dr. Britta, he was diagnosed with a right BBB and had a subsequent normal ETT in July 2012. June 2012 TTE showed normal LV/RV dimensions, wall thickness, global function, normal LA/R, and no significant valvular disease. He had a more recent TTE with Dr. Britta on 12/14/21 for murmur evaluation and showed LV within normal limits, minimally thickened AV with mild AI.   04/30/24 EKG was stable with SR, right BBB. He denied chest pain and SOB.   MRI in December 2024 had shown solid fusion at C6-7, right paramedian disc protrusions at C2-3 and C3-4 with canal narrowing, moderate right foraminal stenosis at C2-3 and C4-5, moderate right foraminal stenosis at C5-6 and mild at left C5-6 and left C7-T1. He was in PT ~ April-May 2025 for his neck pain. 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 He was having numbness and tingling in his shoulder and 4th-5th digits with  decrease right hand strength. He has received cervical injections with short term relief, last on 04/16/24 at C7-T1. Surgery now recommended.   Discussed above with anesthesiologist Jefm Senior, MD. DM2 not well controlled based on A1c of 8.5% (average 197) and up from 5.9% on 07/26/22. He is off his metformin . BP also elevated and no longer on antihypertensive medications. For elective surgery, Dr. Jefm would advise preoperative medical evaluation given off medications and lapse of primary care follow-up since 08/2022. Surgery is posted for ~ 3.5 hours.   Will notify Dr. Joshua' staff of recommendations.   VS: BP (!) 159/92   Pulse 68   Temp 36.8 C   Resp 17   Ht 6' (1.829 m)   Wt 86.6 kg   SpO2 100%   BMI 25.89 kg/m    PROVIDERS: [name], NP will be PCP. Has visit to get established at [facility] on 05/29/24. Previously he was followed by is Britta King, MD with [facility] (see [facility]).   LABS: Preoperative labs noted. A1c 8.5%.  (all labs ordered are listed, but only abnormal results are displayed)  Labs Reviewed  GLUCOSE, CAPILLARY - Abnormal; Notable for the following components:      Result Value   Glucose-Capillary 180 (*)    All other components within normal limits  HEMOGLOBIN A1C - Abnormal; Notable for the following components:   Hgb A1c MFr Bld 8.5 (*)    All other components within normal limits  BASIC METABOLIC  PANEL WITH GFR - Abnormal; Notable for the following components:   Glucose, Bld 189 (*)    All other components within normal limits  CBC - Abnormal; Notable for the following components:   HCT 38.2 (*)    MCHC 36.1 (*)    All other components within normal limits  SURGICAL PCR SCREEN  TYPE AND SCREEN     IMAGES: MRI C-spine 09/28/23 (Canopy/PACS): IMPRESSION: 1. Solid fusion anteriorly at C6-7 without residual or recurrent stenosis. 2. Right paramedian disc protrusions at C2-3 and C3-4 contact  and slightly distort the ventral surface the cord. Canal is narrowed to 6.5 mm at C2-3 and 8.5 mm at C3-4 stable from the prior exam. 3. Moderate right foraminal stenosis at C2-3 and C4-is stable 5. 4. Moderate right and mild left foraminal stenosis at C5-6 is stable. 5. Mild left foraminal narrowing at C7-T1 is stable.  CT Head & C-spine 05/31/23: IMPRESSION: - Normal head CT. - No traumatic injury to the cervical spine. Status post PLIF at C6-7. Mild degenerative changes.    EKG: 05/01/24: Sinus bradycardia at 59 bpm Right bundle branch block Abnormal ECG When compared with ECG of 13-Sep-2017 15:23, No significant change was found Confirmed by Kate Bruckner [phone]) on 04/30/2024 8:57:00 PM   CV: Echo 11/24/21 (Dr. Britta): Technical quality: good Resting Measurements: Normal limits Left Ventrical: Within normal limits Mitral Valve: Normal mitral valve Aortic Valve: Aortic valve is minimally thickened mild AI was noted Tricuspid Valve: Within normal limits Pulmonic Valve: Not seen Left Atrium/ Left atrial appendage: No blood clots Atrial septum:  Aorta: Aortic root is within normal limits Complications: No apparent complications    Exercise stress test on 04/28/11 (for RBBB evaluation; scanned under Media tab, Correspondence 03/18/12):  - Negative for angina, arrhythmia, and ischemic ST changes.     Echo on 04/14/11 (scanned under Media tab, Correspondence 03/18/12):  Conclusions:  Normal left and right ventricular dimensions and wall thickness.  Global left and right ventricular function within normal limits.  Left ventricular ejection fraction 50%, no segmental wall motion abnormalities noted.    Right and left atria normal.  No evidence of pericardial effusion.  Valves are normal for age. Atrial septum intact with no evidence of defect or shunt. Conclusions: No significant valvular disease seen.  Left ventricular function is normal.   Past Medical History:   Diagnosis Date   Arthritis    Blood transfusion    Diabetes mellitus without complication (HCC)    GERD (gastroesophageal reflux disease)    Hypertension    Peptic ulcer     Past Surgical History:  Procedure Laterality Date   ANTERIOR CERVICAL DECOMP/DISCECTOMY FUSION     2 neck fusions prior to 04/30/24   BUNIONECTOMY     CARPAL TUNNEL RELEASE     right   CERVICAL FUSION     COLONOSCOPY N/A 03/04/2015   Procedure: COLONOSCOPY;  Surgeon: Louanne KANDICE Muse, MD;  Location: [facility] ENDOSCOPY;  Service: Endoscopy;  Laterality: N/A;   HAMMER TOE SURGERY     POSTERIOR CERVICAL FUSION/FORAMINOTOMY  03/16/2012   Procedure: POSTERIOR CERVICAL FUSION/FORAMINOTOMY LEVEL 1;  Surgeon: Alm GORMAN Molt, MD;  Location: [facility];  Service: Neurosurgery;  Laterality: N/A;  Cervical six-seven posterior cervical fusion with lateral mass screws    MEDICATIONS:  cholecalciferol (VITAMIN D3) 25 MCG (1000 UNIT) tablet   ELDERBERRY PO   fenofibrate  160 MG tablet   HYDROcodone -acetaminophen  (NORCO) 5-325 MG per tablet   losartan  (COZAAR ) 25 MG tablet  metFORMIN  (GLUCOPHAGE -XR) 500 MG 24 hr tablet   Multiple Vitamins-Minerals (MENS MULTIVITAMIN PO)   oxyCODONE-acetaminophen  (PERCOCET) 10-325 MG tablet   sildenafil (REVATIO) 20 MG tablet   No current facility-administered medications for this encounter.     Isaiah Ruder, PA-C Surgical Short Stay/Anesthesiology [facility] Phone [phone] [facility] Phone [phone] 05/01/2024 5:48 PM

## 2024-05-04 ENCOUNTER — Telehealth: Payer: Self-pay | Admitting: Nurse Practitioner

## 2024-05-04 NOTE — Telephone Encounter (Signed)
 No available appointments in office until 05/11/2024     Copied from CRM #[id number]. Topic: Appointments - Scheduling Inquiry for Clinic >> May 04, 2024  1:54 PM Thersia BROCKS wrote: Reason for CRM: Patient called in regarding appointment, stated patient has a surgery scheduled 07/23 surgeon stated that he needs to be seen by pcp before surgery, patient wanted to know if there is anyway he could be seen sooner, would like a callback to be scheduled if he could

## 2024-05-07 ENCOUNTER — Ambulatory Visit: Admitting: Internal Medicine

## 2024-05-07 ENCOUNTER — Telehealth: Payer: Self-pay

## 2024-05-07 ENCOUNTER — Encounter: Payer: Self-pay | Admitting: Internal Medicine

## 2024-05-07 VITALS — BP 124/82 | HR 80 | Temp 98.1°F | Ht 72.0 in | Wt 190.6 lb

## 2024-05-07 DIAGNOSIS — I10 Essential (primary) hypertension: Secondary | ICD-10-CM | POA: Diagnosis not present

## 2024-05-07 DIAGNOSIS — E1169 Type 2 diabetes mellitus with other specified complication: Secondary | ICD-10-CM | POA: Diagnosis not present

## 2024-05-07 DIAGNOSIS — Z01818 Encounter for other preprocedural examination: Secondary | ICD-10-CM | POA: Insufficient documentation

## 2024-05-07 DIAGNOSIS — Z0000 Encounter for general adult medical examination without abnormal findings: Secondary | ICD-10-CM

## 2024-05-07 DIAGNOSIS — Z7984 Long term (current) use of oral hypoglycemic drugs: Secondary | ICD-10-CM

## 2024-05-07 DIAGNOSIS — E559 Vitamin D deficiency, unspecified: Secondary | ICD-10-CM | POA: Diagnosis not present

## 2024-05-07 DIAGNOSIS — E782 Mixed hyperlipidemia: Secondary | ICD-10-CM | POA: Diagnosis not present

## 2024-05-07 LAB — LIPID PANEL
Cholesterol: 205 mg/dL — ABNORMAL HIGH (ref 0–200)
HDL: 27.2 mg/dL — ABNORMAL LOW (ref >39.00)
NonHDL: 177.8
Total CHOL/HDL Ratio: 8
Triglycerides: 774 mg/dL — ABNORMAL HIGH (ref 0.0–149.0)
VLDL: 154.8 mg/dL — ABNORMAL HIGH (ref 0.0–40.0)

## 2024-05-07 LAB — MICROALBUMIN / CREATININE URINE RATIO
Creatinine,U: 173.4 mg/dL
Microalb Creat Ratio: UNDETERMINED mg/g (ref 0.0–30.0)
Microalb, Ur: <0.7 mg/dL

## 2024-05-07 LAB — VITAMIN D 25 HYDROXY (VIT D DEFICIENCY, FRACTURES): VITD: 31.41 ng/mL (ref 30.00–100.00)

## 2024-05-07 LAB — LDL CHOLESTEROL, DIRECT: Direct LDL: 44 mg/dL

## 2024-05-07 MED ORDER — METFORMIN HCL ER 500 MG PO TB24: ORAL_TABLET | ORAL | 0 refills | Status: DC

## 2024-05-07 NOTE — Progress Notes (Signed)
 New Patient Office Visit  Subjective    Patient ID: Eric Owen., male    DOB: 1966-07-16  Age: 58 y.o. MRN: [id number]  CC:  Chief Complaint  Patient presents with   Acute Visit    Medical clearance    HPI Eric Owen. presents to establish care Patient presents today to establish care and for preop clearance for his anterior cervical decompression fusion with neurosurgery for Wednesday.  Patient states that he has an appointment with NP Vincente in August and will follow-up with her as his regular PCP.  Patient states that he has been having neck pain rating down his right arm with associated weakness in his right hand.  He has had 2 prior surgeries for this and is scheduled for another one on Wednesday (anterior cervical decompression and fusion).  He states that he has 2 flights of stairs at home and is able to walk up and down the stairs without any issues.  He also walks long distances without having to stop.  Denies any chest pain or shortness of breath with either of these activities.  He is also able to do housework at home including vacuuming without any limitation.  Patient does have a history of diabetes which is currently uncontrolled with an A1c of 8.5.  He was on metformin  for but has not been on this in a while.  He denies any chest pain or shortness of breath or polydipsia/polyphagia/polyuria.  He did have an eye exam done in April at my eye doctor on [address] and states that this was normal.  He was on medication for hypertension as well but has not been on this for a while and his blood pressure is well-controlled today at 124/82.  He is on vitamin D  chronically for vitamin D  deficiency.  Outpatient Encounter Medications as of 05/07/2024  Medication Sig   ELDERBERRY PO Take 1 capsule by mouth daily.   Multiple Vitamins-Minerals (MENS MULTIVITAMIN PO) Take 1 tablet by mouth daily.   oxyCODONE-acetaminophen  (PERCOCET) 10-325 MG tablet Take 1 tablet by mouth every  6 (six) hours as needed for pain.   [DISCONTINUED] fenofibrate  160 MG tablet Take 1 tablet (160 mg total) by mouth daily.   cholecalciferol (VITAMIN D3) 25 MCG (1000 UNIT) tablet Take 1 tablet (1,000 Units total) by mouth daily. (Patient not taking: Reported on 04/25/2024)   metFORMIN  (GLUCOPHAGE -XR) 500 MG 24 hr tablet Take 1 tablet (500 mg total) by mouth daily with breakfast for 7 days, THEN 1 tablet (500 mg total) 2 (two) times daily with a meal. TAKE 1 TABLET(500 MG) BY MOUTH DAILY WITH BREAKFAST.   [DISCONTINUED] HYDROcodone -acetaminophen  (NORCO) 5-325 MG per tablet Take 1 tablet by mouth every 6 (six) hours as needed. For pain   [DISCONTINUED] losartan  (COZAAR ) 25 MG tablet Take 1 tablet (25 mg total) by mouth daily. (Patient not taking: Reported on 04/25/2024)   [DISCONTINUED] metFORMIN  (GLUCOPHAGE -XR) 500 MG 24 hr tablet TAKE 1 TABLET(500 MG) BY MOUTH DAILY WITH BREAKFAST (Patient not taking: Reported on 04/25/2024)   [DISCONTINUED] sildenafil (REVATIO) 20 MG tablet TAKE 1 TABLET BY MOUTH DAILY AS NEEDED   No facility-administered encounter medications on file as of 05/07/2024.    Past Medical History:  Diagnosis Date   Arthritis    Blood transfusion    Diabetes mellitus without complication (HCC)    GERD (gastroesophageal reflux disease)    Hypertension    Peptic ulcer     Past Surgical History:  Procedure Laterality  Date   ANTERIOR CERVICAL DECOMP/DISCECTOMY FUSION     2 neck fusions prior to 04/30/24   BUNIONECTOMY     CARPAL TUNNEL RELEASE     right   CERVICAL FUSION     COLONOSCOPY N/A 03/04/2015   Procedure: COLONOSCOPY;  Surgeon: Louanne KANDICE Muse, MD;  Location: [facility] ENDOSCOPY;  Service: Endoscopy;  Laterality: N/A;   HAMMER TOE SURGERY     POSTERIOR CERVICAL FUSION/FORAMINOTOMY  03/16/2012   Procedure: POSTERIOR CERVICAL FUSION/FORAMINOTOMY LEVEL 1;  Surgeon: Alm GORMAN Molt, MD;  Location: [facility];  Service: Neurosurgery;  Laterality: N/A;  Cervical six-seven  posterior cervical fusion with lateral mass screws    History reviewed. No pertinent family history.  Social History   Socioeconomic History   Marital status: Married    Spouse name: Not on file   Number of children: 4   Years of education: Not on file   Highest education level: Associate degree: occupational, Scientist, product/process development, or vocational program  Occupational History   Not on file  Tobacco Use   Smoking status: Former    Current packs/day: 0.00    Average packs/day: 0.4 packs/day for 18.0 years (7.2 ttl pk-yrs)    Types: Cigarettes    Start date: 01/28/1988    Quit date: 01/27/2006    Years since quitting: 18.2   Smokeless tobacco: Never  Vaping Use   Vaping status: Never Used  Substance and Sexual Activity   Alcohol use: Yes    Comment: maybe once a week, occasionally   Drug use: No   Sexual activity: Not on file  Other Topics Concern   Not on file  Social History Narrative   Not on file   Social Drivers of Health   Financial Resource Strain: Low Risk  (05/04/2024)   Overall Financial Resource Strain (CARDIA)    Difficulty of Paying Living Expenses: Not hard at all  Food Insecurity: No Food Insecurity (05/04/2024)   Hunger Vital Sign    Worried About Running Out of Food in the Last Year: Never true    Ran Out of Food in the Last Year: Never true  Transportation Needs: No Transportation Needs (05/04/2024)   PRAPARE - Administrator, Civil Service (Medical): No    Lack of Transportation (Non-Medical): No  Physical Activity: Insufficiently Active (05/04/2024)   Exercise Vital Sign    Days of Exercise per Week: 4 days    Minutes of Exercise per Session: 20 min  Stress: No Stress Concern Present (05/04/2024)   [employer] of Occupational Health - Occupational Stress Questionnaire    Feeling of Stress: Not at all  Social Connections: Socially Integrated (05/04/2024)   Social Connection and Isolation Panel    Frequency of Communication with Friends and  Family: More than three times a week    Frequency of Social Gatherings with Friends and Family: Three times a week    Attends Religious Services: More than 4 times per year    Active Member of Clubs or Organizations: Yes    Attends Engineer, structural: More than 4 times per year    Marital Status: Married  Catering manager Violence: Not on file    Review of Systems  Constitutional: Negative.   HENT: Negative.    Eyes: Negative.   Respiratory: Negative.    Cardiovascular: Negative.   Musculoskeletal:  Positive for neck pain.       Complains of neck pain rating down his right arm  Neurological:  Positive for tingling  and focal weakness.       Complains of weakness in his right hand as well as tingling and numbness  Psychiatric/Behavioral: Negative.          Objective    BP 124/82   Pulse 80   Temp 98.1 F (36.7 C)   Ht 6' (1.829 m)   Wt 190 lb 9.6 oz (86.5 kg)   SpO2 97%   BMI 25.85 kg/m   Physical Exam Constitutional:      Appearance: Normal appearance.  HENT:     Head: Normocephalic and atraumatic.  Cardiovascular:     Rate and Rhythm: Normal rate and regular rhythm.     Heart sounds: Normal heart sounds.  Pulmonary:     Breath sounds: Normal breath sounds. No wheezing or rales.  Abdominal:     General: Bowel sounds are normal. There is no distension.     Palpations: Abdomen is soft.     Tenderness: There is no abdominal tenderness.  Musculoskeletal:        General: No swelling or tenderness.  Neurological:     Mental Status: He is alert and oriented to person, place, and time.     Motor: Weakness present.     Comments: Mildly decreased grip strength noted in his right hand compared to his left.  Psychiatric:        Mood and Affect: Mood normal.        Behavior: Behavior normal.         Assessment & Plan:   Problem List Items Addressed This Visit       Cardiovascular and Mediastinum   Essential hypertension   - This problem is chronic  and stable - Patient was previously on losartan  but is not on any medication currently -Blood pressure is well-controlled at 124/82 today -Patient's kidney function was normal on recent blood work -No further workup at this time.  Will continue to monitor off antihypertensives for now        Endocrine   Type 2 diabetes mellitus with other specified complication (HCC)   - Patient has a history of type 2 diabetes associated with hypertension and hyperlipidemia. -He was previously on metformin  but has not been on this medication for a while -His A1c was noted to be high at 8.5 -This is not a contraindication for surgery at this time -We will restart his metformin  after his surgery and start him at 500 mg daily for 7 days and then increase to 500 mg twice a day -He will follow-up with NP Vincente in August for further titration as needed -Will check a UACR today as well as a lipid panel -No further workup at this time      Relevant Medications   metFORMIN  (GLUCOPHAGE -XR) 500 MG 24 hr tablet   Other Relevant Orders   Microalbumin / creatinine urine ratio     Other   Encounter for preventive care   - We will do HIV screening as well as hepatitis C screening today -Patient did have his eye exam done in April at my eye doctor on [facility].  We will attempt to obtain records from them -He is due for the shingles vaccine as well as a pneumococcal vaccine.  Will defer this to his follow-up in August -He is also due for foot exam which we will do in August      Mixed dyslipidemia - Primary   - This problem is chronic -We will recheck his lipid panel today -  If his LDL is elevated we will start him on a statin given that he is a diabetic -No further workup at this time      Relevant Orders   Lipid panel   Preoperative clearance   - Patient presents today for preoperative medical clearance for an anterior cervical decompression and fusion scheduled on Wednesday, July 23 -His RCRI score  is 0 and he is able to do at least 4 METS without any limitation -No medical contraindication to the procedure at this time.  He is at moderate risk for postop cardiac complications given his history of diabetes -He does have uncontrolled diabetes with an A1c of 8.5 but this should not cause a delay in his surgery.  We will initiate metformin  for this after his surgery -No further workup at this time      Relevant Orders   HIV antibody (with reflex)   Hepatitis C Antibody   Vitamin D  deficiency   - Patient has a history of vitamin D  deficiency -He is on vitamin D  supplementation daily -We will recheck vitamin D  level on him today      Relevant Orders   VITAMIN D  25 Hydroxy (Vit-D Deficiency, Fractures)    No follow-ups on file.   Sussan Meter, MD

## 2024-05-07 NOTE — Assessment & Plan Note (Signed)
-   Patient presents today for preoperative medical clearance for an anterior cervical decompression and fusion scheduled on Wednesday, July 23 -His RCRI score is 0 and he is able to do at least 4 METS without any limitation -No medical contraindication to the procedure at this time.  He is at moderate risk for postop cardiac complications given his history of diabetes -He does have uncontrolled diabetes with an A1c of 8.5 but this should not cause a delay in his surgery.  We will initiate metformin  for this after his surgery -No further workup at this time

## 2024-05-07 NOTE — Patient Instructions (Signed)
-   It was a pleasure meeting you today -I have prescribed metformin  for your diabetes.  Please take 1 tablet with breakfast once daily for 7 days and then go up to 1 tablet twice a day with breakfast and dinner till your appointment with the nurse practitioner Ester) in August -We will check your cholesterol and vitamin D  levels to see if you need additional medication for these conditions -Will also check your hepatitis C and HIV antibodies as part of your preop clearance and routine screening -Will also check a urine test to ensure that your kidneys are doing okay as diabetes can cause damage to your kidneys -Please contact us  with any questions or concerns or if you need any refills -Please follow-up with Chelsea Aurora in August.  You will also need an annual foot exam (to be done in this office) as well as an eye exam with an optometrist/ophthalmologist yearly

## 2024-05-07 NOTE — Telephone Encounter (Signed)
 Pt seen in office 05/07/2024

## 2024-05-07 NOTE — Assessment & Plan Note (Signed)
-   Patient has a history of vitamin D  deficiency -He is on vitamin D  supplementation daily -We will recheck vitamin D  level on him today

## 2024-05-07 NOTE — Telephone Encounter (Signed)
 Copied from CRM [id number]. Topic: Referral - Question >> May 07, 2024  4:07 PM Deleta RAMAN wrote: Reason for CRM: patient has surgery scheduled for July 23rd he would like for pcp office to send clearance as soon as possible to obtain this appointment. Please notify the patient at [phone]

## 2024-05-07 NOTE — Assessment & Plan Note (Signed)
-   This problem is chronic and stable - Patient was previously on losartan  but is not on any medication currently -Blood pressure is well-controlled at 124/82 today -Patient's kidney function was normal on recent blood work -No further workup at this time.  Will continue to monitor off antihypertensives for now

## 2024-05-07 NOTE — Assessment & Plan Note (Signed)
-   This problem is chronic -We will recheck his lipid panel today -If his LDL is elevated we will start him on a statin given that he is a diabetic -No further workup at this time

## 2024-05-07 NOTE — Telephone Encounter (Signed)
 Sent request for recent diabetic eye exam from [facility].

## 2024-05-07 NOTE — Assessment & Plan Note (Signed)
-   We will do HIV screening as well as hepatitis C screening today -Patient did have his eye exam done in April at my eye doctor on [facility].  We will attempt to obtain records from them -He is due for the shingles vaccine as well as a pneumococcal vaccine.  Will defer this to his follow-up in August -He is also due for foot exam which we will do in August

## 2024-05-07 NOTE — Assessment & Plan Note (Signed)
-   Patient has a history of type 2 diabetes associated with hypertension and hyperlipidemia. -He was previously on metformin  but has not been on this medication for a while -His A1c was noted to be high at 8.5 -This is not a contraindication for surgery at this time -We will restart his metformin  after his surgery and start him at 500 mg daily for 7 days and then increase to 500 mg twice a day -He will follow-up with NP Vincente in August for further titration as needed -Will check a UACR today as well as a lipid panel -No further workup at this time

## 2024-05-08 ENCOUNTER — Other Ambulatory Visit: Payer: Self-pay

## 2024-05-08 ENCOUNTER — Ambulatory Visit: Payer: Self-pay | Admitting: Internal Medicine

## 2024-05-08 DIAGNOSIS — E782 Mixed hyperlipidemia: Secondary | ICD-10-CM

## 2024-05-08 LAB — HIV ANTIBODY (ROUTINE TESTING W REFLEX): HIV 1&2 Ab, 4th Generation: NONREACTIVE

## 2024-05-08 LAB — HEPATITIS C ANTIBODY: Hepatitis C Ab: NONREACTIVE

## 2024-05-08 MED ORDER — ROSUVASTATIN CALCIUM 10 MG PO TABS: 10.0000 mg | ORAL_TABLET | Freq: Every day | ORAL | 3 refills | Status: DC

## 2024-05-08 MED ORDER — ROSUVASTATIN CALCIUM 10 MG PO TABS: 10.0000 mg | ORAL_TABLET | Freq: Every day | ORAL | 3 refills | Status: AC

## 2024-05-08 NOTE — Telephone Encounter (Signed)
 Called to let Patient know that the medical clearance was faxed on 05/07/24 at 10:51 am and I received an ok confirmation.

## 2024-05-09 ENCOUNTER — Inpatient Hospital Stay (HOSPITAL_COMMUNITY): Admission: RE | Admit: 2024-05-09 | Source: Home / Self Care | Admitting: Neurological Surgery

## 2024-05-09 ENCOUNTER — Encounter (HOSPITAL_COMMUNITY): Admission: RE | Payer: Self-pay | Source: Home / Self Care

## 2024-05-09 SURGERY — ANTERIOR CERVICAL DECOMPRESSION/DISCECTOMY FUSION 4 LEVELS
Anesthesia: General

## 2024-05-29 ENCOUNTER — Ambulatory Visit: Admitting: Nurse Practitioner

## 2024-05-29 ENCOUNTER — Encounter: Payer: Self-pay | Admitting: Nurse Practitioner

## 2024-05-29 VITALS — BP 122/64 | HR 86 | Temp 97.9°F | Resp 20 | Ht 72.0 in | Wt 194.0 lb

## 2024-05-29 DIAGNOSIS — Z125 Encounter for screening for malignant neoplasm of prostate: Secondary | ICD-10-CM

## 2024-05-29 DIAGNOSIS — K219 Gastro-esophageal reflux disease without esophagitis: Secondary | ICD-10-CM

## 2024-05-29 DIAGNOSIS — E782 Mixed hyperlipidemia: Secondary | ICD-10-CM | POA: Diagnosis not present

## 2024-05-29 DIAGNOSIS — E1169 Type 2 diabetes mellitus with other specified complication: Secondary | ICD-10-CM

## 2024-05-29 DIAGNOSIS — E559 Vitamin D deficiency, unspecified: Secondary | ICD-10-CM

## 2024-05-29 NOTE — Assessment & Plan Note (Signed)
 Chronic stable

## 2024-05-29 NOTE — Assessment & Plan Note (Signed)
 Chronic. Lab Results  Component Value Date   CHOL 205 (H) 05/07/2024   HDL 27.20 (L) 05/07/2024   LDLCALC 124 (H) 09/14/2022   LDLDIRECT 44.0 05/07/2024   TRIG (H) 05/07/2024    774.0 Triglyceride is over 400; calculations on Lipids are invalid.   CHOLHDL 8 05/07/2024  -Continue Crestor  10 mg daily. -Will recheck fasting lipid panel in one month. - Consider restarting fenofibrate  if triglycerides remain elevated.

## 2024-05-29 NOTE — Assessment & Plan Note (Addendum)
 Chronic and uncontrolled.  Lab Results  Component Value Date   HGBA1C 8.5 (H) 04/30/2024  -Continue metformin  500 mg twice a day. -Diabetic foot examination performed. - Denies polyuria, polydipsia, or polyphagia. - Advised on lifestyle interventions and diet management. - Will continue to monitor.

## 2024-05-29 NOTE — Assessment & Plan Note (Signed)
 Chronic stable. -Vitamin D  31.41 on 05/07/24. - Continue vitamin D  supplement daily.

## 2024-05-29 NOTE — Progress Notes (Signed)
 New Patient Office Visit  Subjective   Patient ID: Eric Owen., male    DOB: [birth year]  Age: 58 y.o. MRN: [id number]  CC:  Chief Complaint  Patient presents with   Establish Care    HPI Eric Owen. presents to establish care. His previous PCP was Dr. Britta.  Diabetes : managed with metformin , with a recent hemoglobin A1c of 8.5%. Diarrhea from metformin  resolved with the extended-release form.   Hyperlipidemia: recently started on Crestor , with triglycerides at 774 mg/dL. Previously on fenofibrate , he experienced a lapse in medication.   Hypertension:  was managed with lisinopril, but he is not currently on medication due to stable blood pressure.  Vitamin D : On Vit D supplement daily.   He experiences cervical pain radiating to his hand with loss of strength and is scheduled for cervical surgery on June 08, 2024. Previous surgeries include cervical fusion, hammertoe surgery, bunionectomy, and carpal tunnel release.  He had appointment with Dr. Narendra on 05/07/2024 for preoperative clearance and lab work.    Family history includes diabetes in his sister, arthritis and heart disease in his mother, and a maternal grandfather with a colostomy. Socially, he quit smoking in 2007, drinks alcohol occasionally, and denies drug use. He is married with four children. No current urinary issues, constipation, diarrhea, numbness, or tingling in feet.  Health Maintenance  Topic Date Due   FOOT EXAM  Never done   Pneumococcal Vaccine: 19-49 Years (1 of 2 - PCV) Never done   Pneumococcal Vaccine: 50+ Years (1 of 2 - PCV) Never done   Hepatitis B Vaccines (1 of 3 - 19+ 3-dose series) Never done   Zoster Vaccines- Shingrix (1 of 2) Never done   COVID-19 Vaccine (4 - 2024-25 season) 06/13/2024 (Originally 06/19/2023)   HEMOGLOBIN A1C  10/31/2024   OPHTHALMOLOGY EXAM  03/03/2025   Colonoscopy  03/03/2025   Diabetic kidney evaluation - eGFR measurement  04/30/2025   Diabetic kidney  evaluation - Urine ACR  05/07/2025   DTaP/Tdap/Td (3 - Td or Tdap) 07/26/2032   Hepatitis C Screening  Completed   HIV Screening  Completed   HPV VACCINES  Aged Out   Meningococcal B Vaccine  Aged Out   INFLUENZA VACCINE  Discontinued       Topic Date Due   Hepatitis B Vaccines (1 of 3 - 19+ 3-dose series) Never done    Outpatient Encounter Medications as of 05/29/2024  Medication Sig   cholecalciferol (VITAMIN D3) 25 MCG (1000 UNIT) tablet Take 1 tablet (1,000 Units total) by mouth daily.   ELDERBERRY PO Take 1 capsule by mouth daily.   metFORMIN  (GLUCOPHAGE -XR) 500 MG 24 hr tablet Take 1 tablet (500 mg total) by mouth daily with breakfast for 7 days, THEN 1 tablet (500 mg total) 2 (two) times daily with a meal. TAKE 1 TABLET(500 MG) BY MOUTH DAILY WITH BREAKFAST.   Multiple Vitamins-Minerals (MENS MULTIVITAMIN PO) Take 1 tablet by mouth daily.   oxyCODONE -acetaminophen  (PERCOCET) 10-325 MG tablet Take 1 tablet by mouth every 6 (six) hours as needed for pain.   rosuvastatin  (CRESTOR ) 10 MG tablet Take 1 tablet (10 mg total) by mouth daily.   [DISCONTINUED] rosuvastatin  (CRESTOR ) 10 MG tablet Take 1 tablet (10 mg total) by mouth daily.   No facility-administered encounter medications on file as of 05/29/2024.    Past Medical History:  Diagnosis Date   Arthritis    Blood transfusion    Diabetes mellitus without complication (HCC)  GERD (gastroesophageal reflux disease)    Hypertension    Peptic ulcer     Past Surgical History:  Procedure Laterality Date   ANTERIOR CERVICAL DECOMP/DISCECTOMY FUSION     2 neck fusions prior to 04/30/24   BUNIONECTOMY     CARPAL TUNNEL RELEASE     right   CERVICAL FUSION     COLONOSCOPY N/A 03/04/2015   Procedure: COLONOSCOPY;  Surgeon: Louanne KANDICE Muse, MD;  Location: [facility] END[facility]COPY;  Service: Endoscopy;  Laterality: N/A;   HAMMER TOE SURGERY     POSTERIOR CERVICAL FUSION/FORAMINOTOMY  03/16/2012   Procedure: POSTERIOR CERVICAL  FUSION/FORAMINOTOMY LEVEL 1;  Surgeon: Alm GORMAN Molt, MD;  Location: [facility];  Service: Neurosurgery;  Laterality: N/A;  Cervical six-seven posterior cervical fusion with lateral mass screws   SPINE SURGERY      Family History  Problem Relation Age of Onset   Arthritis Mother    Heart disease Mother    Diabetes Sister     Social History   Socioeconomic History   Marital status: Married    Spouse name: Not on file   Number of children: 4   Years of education: Not on file   Highest education level: Associate degree: occupational, Scientist, product/process development, or vocational program  Occupational History   Not on file  Tobacco Use   Smoking status: Former    Current packs/day: 0.00    Average packs/day: 0.4 packs/day for 18.0 years (7.2 ttl pk-yrs)    Types: Cigarettes    Start date: 01/28/1988    Quit date: 04/23/2006    Years since quitting: 18.1   Smokeless tobacco: Never  Vaping Use   Vaping status: Never Used  Substance and Sexual Activity   Alcohol use: Yes    Alcohol/week: 2.0 standard drinks of alcohol    Types: 2 Glasses of wine per week    Comment: maybe once a week, occasionally   Drug use: No   Sexual activity: Yes    Birth control/protection: None  Other Topics Concern   Not on file  Social History Narrative   Married lives with wife. 3 sons lives in [place] and 1 daughter in [place]   Social Drivers of Health   Financial Resource Strain: Low Risk  (05/04/2024)   Overall Financial Resource Strain (CARDIA)    Difficulty of Paying Living Expenses: Not hard at all  Food Insecurity: No Food Insecurity (05/04/2024)   Hunger Vital Sign    Worried About Running Out of Food in the Last Year: Never true    Ran Out of Food in the Last Year: Never true  Transportation Needs: No Transportation Needs (05/04/2024)   PRAPARE - Administrator, Civil Service (Medical): No    Lack of Transportation (Non-Medical): No  Physical Activity: Insufficiently Active (05/04/2024)   Exercise Vital  Sign    Days of Exercise per Week: 4 days    Minutes of Exercise per Session: 20 min  Stress: No Stress Concern Present (05/04/2024)   [employer] of Occupational Health - Occupational Stress Questionnaire    Feeling of Stress: Not at all  Social Connections: Socially Integrated (05/04/2024)   Social Connection and Isolation Panel    Frequency of Communication with Friends and Family: More than three times a week    Frequency of Social Gatherings with Friends and Family: Three times a week    Attends Religious Services: More than 4 times per year    Active Member of Clubs or Organizations: Yes  Attends Banker Meetings: More than 4 times per year    Marital Status: Married  Catering manager Violence: Not on file    ROS Negative unless indicated in HPI.      Objective    BP 122/64   Pulse 86   Temp 97.9 F (36.6 C)   Resp 20   Ht 6' (1.829 m)   Wt 194 lb (88 kg)   SpO2 97%   BMI 26.31 kg/m   Physical Exam Constitutional:      Appearance: Normal appearance.  HENT:     Head: Normocephalic.     Mouth/Throat:     Mouth: Mucous membranes are moist.     Dentition: Has dentures (upper).  Eyes:     Conjunctiva/sclera: Conjunctivae normal.     Pupils: Pupils are equal, round, and reactive to light.  Cardiovascular:     Rate and Rhythm: Normal rate and regular rhythm.     Pulses: Normal pulses.     Heart sounds: Normal heart sounds.  Pulmonary:     Effort: Pulmonary effort is normal.     Breath sounds: Normal breath sounds.  Abdominal:     General: Bowel sounds are normal.     Palpations: Abdomen is soft.  Musculoskeletal:     Cervical back: Normal range of motion. No tenderness.  Skin:    General: Skin is warm.     Findings: No bruising.  Neurological:     General: No focal deficit present.     Mental Status: He is alert and oriented to person, place, and time. Mental status is at baseline.  Psychiatric:        Mood and Affect: Mood normal.         Behavior: Behavior normal.        Thought Content: Thought content normal.        Judgment: Judgment normal.         Assessment & Plan:  Type 2 diabetes mellitus with other specified complication, without long-term current use of insulin  (HCC) Assessment & Plan: Chronic and uncontrolled.  Lab Results  Component Value Date   HGBA1C 8.5 (H) 04/30/2024  -Continue metformin  500 mg twice a day. -Diabetic foot examination performed. - Denies polyuria, polydipsia, or polyphagia. - Advised on lifestyle interventions and diet management. - Will continue to monitor.    Screening PSA (prostate specific antigen) -     PSA; Future  Mixed dyslipidemia Assessment & Plan: Chronic. Lab Results  Component Value Date   CHOL 205 (H) 05/07/2024   HDL 27.20 (L) 05/07/2024   LDLCALC 124 (H) 09/14/2022   LDLDIRECT 44.0 05/07/2024   TRIG (H) 05/07/2024    774.0 Triglyceride is over 400; calculations on Lipids are invalid.   CHOLHDL 8 05/07/2024  -Continue Crestor  10 mg daily. -Will recheck fasting lipid panel in one month. - Consider restarting fenofibrate  if triglycerides remain elevated.   Orders: -     Lipid panel; Future  Gastroesophageal reflux disease, unspecified whether esophagitis present Assessment & Plan: Chronic stable.   Vitamin D  deficiency Assessment & Plan: Chronic stable. -Vitamin D  31.41 on 05/07/24. - Continue vitamin D  supplement daily.     Return in about 5 weeks (around 07/03/2024) for follow up with fasting lab 2 days prior.   Jessika Rothery, NP

## 2024-06-04 ENCOUNTER — Encounter (HOSPITAL_COMMUNITY): Payer: Self-pay

## 2024-06-04 NOTE — Progress Notes (Incomplete)
 PCP - [facility] in [place] Cardiologist - none Medical Clearance on 05/07/24 from Dr Hans Grad  Chest x-ray - n/a EKG - 04/30/24 Stress Test - 04/28/11 ECHO - 11/24/21 Cardiac Cath - n/a  ICD Pacemaker/Loop - n/a  Sleep Study -  n/a  Diabetes Type 2  Checks Blood Sugar rarely checks CBG  Do not take Metformin  on the morning of surgery.  If your blood sugar is less than 70 mg/dL, you will need to treat for low blood sugar: Treat a low blood sugar (less than 70 mg/dL) with  cup of clear juice (cranberry or apple), 4 glucose tablets, OR glucose gel. Recheck blood sugar in 15 minutes after treatment (to make sure it is greater than 70 mg/dL). If your blood sugar is not greater than 70 mg/dL on recheck, call [phone] for further instructions.  Aspirin & Blood Thinner Instructions:  n/a  NPO  Anesthesia review: Yes,  STOP now taking any Aspirin (unless otherwise instructed by your surgeon), Aleve, Naproxen, Ibuprofen, Motrin, Advil, Goody's, BC's, all herbal medications, fish oil, and all vitamins.   Coronavirus Screening Do you have any of the following symptoms:  Cough yes/no: No Fever (>100.22F)  yes/no: No Runny nose yes/no: No Sore throat yes/no: No Difficulty breathing/shortness of breath  yes/no: No  Have you traveled in the last 14 days and where? yes/no: No  Patient verbalized understanding of instructions that were given to them at the PAT appointment. Patient was also instructed that they will need to review over the PAT instructions again at home before surgery.

## 2024-06-04 NOTE — Progress Notes (Signed)
 Surgical Instructions   Your procedure is scheduled on Friday June 08, 2024. Report to [facility] Main Entrance A at 9:50 A.M., then check in with the Admitting office. Any questions or running late day of surgery: call [phone]  Questions prior to your surgery date: call [phone], Monday-Friday, 8am-4pm. If you experience any cold or flu symptoms such as cough, fever, chills, shortness of breath, etc. between now and your scheduled surgery, please notify us  at the above number.     Remember:  Do not eat or drink  after midnight the night before your surgery  Take these medicines the morning of surgery with A SIP OF WATER  rosuvastatin  (CRESTOR )   May take these medicines IF NEEDED: oxyCODONE -acetaminophen  (PERCOCET)    One week prior to surgery, STOP taking any Aspirin (unless otherwise instructed by your surgeon) Aleve, Naproxen, Ibuprofen, Motrin, Advil, Goody's, BC's, all herbal medications, fish oil, and non-prescription vitamins.   WHAT DO I DO ABOUT MY DIABETES MEDICATION?   Do not take oral diabetes medicines (pills) the morning of surgery.        DO NOT TAKE YOUR metFORMIN  (GLUCOPHAGE -XR) THE MORNING OF SURGERY.    The day of surgery, do not take other diabetes injectables, including Byetta (exenatide), Bydureon (exenatide ER), Victoza (liraglutide), or Trulicity (dulaglutide).  If your CBG is greater than 220 mg/dL, you may take  of your sliding scale (correction) dose of insulin .   HOW TO MANAGE YOUR DIABETES BEFORE AND AFTER SURGERY  Why is it important to control my blood sugar before and after surgery? Improving blood sugar levels before and after surgery helps healing and can limit problems. A way of improving blood sugar control is eating a healthy diet by:  Eating less sugar and carbohydrates  Increasing activity/exercise  Talking with your doctor about reaching your blood sugar goals High blood sugars (greater than 180 mg/dL) can raise your  risk of infections and slow your recovery, so you will need to focus on controlling your diabetes during the weeks before surgery. Make sure that the doctor who takes care of your diabetes knows about your planned surgery including the date and location.  How do I manage my blood sugar before surgery? Check your blood sugar at least 4 times a day, starting 2 days before surgery, to make sure that the level is not too high or low.  Check your blood sugar the morning of your surgery when you wake up and every 2 hours until you get to the Short Stay unit.  If your blood sugar is less than 70 mg/dL, you will need to treat for low blood sugar: Do not take insulin . Treat a low blood sugar (less than 70 mg/dL) with  cup of clear juice (cranberry or apple), 4 glucose tablets, OR glucose gel. Recheck blood sugar in 15 minutes after treatment (to make sure it is greater than 70 mg/dL). If your blood sugar is not greater than 70 mg/dL on recheck, call [phone] for further instructions. Report your blood sugar to the short stay nurse when you get to Short Stay.  If you are admitted to the hospital after surgery: Your blood sugar will be checked by the staff and you will probably be given insulin  after surgery (instead of oral diabetes medicines) to make sure you have good blood sugar levels. The goal for blood sugar control after surgery is 80-180 mg/dL.  Do NOT Smoke (Tobacco/Vaping) for 24 hours prior to your procedure.  If you use a CPAP at night, you may bring your mask/headgear for your overnight stay.   You will be asked to remove any contacts, glasses, piercing's, hearing aid's, dentures/partials prior to surgery. Please bring cases for these items if needed.    Patients discharged the day of surgery will not be allowed to drive home, and someone needs to stay with them for 24 hours.  SURGICAL WAITING ROOM VISITATION Patients may have no more than 2 support people in  the waiting area - these visitors may rotate.   Pre-op nurse will coordinate an appropriate time for 1 ADULT support person, who may not rotate, to accompany patient in pre-op.  Children under the age of 43 must have an adult with them who is not the patient and must remain in the main waiting area with an adult.  If the patient needs to stay at the hospital during part of their recovery, the visitor guidelines for inpatient rooms apply.  Please refer to the [facility] website for the visitor guidelines for any additional information.   If you received a COVID test during your pre-op visit  it is requested that you wear a mask when out in public, stay away from anyone that may not be feeling well and notify your surgeon if you develop symptoms. If you have been in contact with anyone that has tested positive in the last 10 days please notify you surgeon.      Pre-operative 5 CHG Bathing Instructions   You can play a key role in reducing the risk of infection after surgery. Your skin needs to be as free of germs as possible. You can reduce the number of germs on your skin by washing with CHG (chlorhexidine  gluconate) soap before surgery. CHG is an antiseptic soap that kills germs and continues to kill germs even after washing.   DO NOT use if you have an allergy to chlorhexidine /CHG or antibacterial soaps. If your skin becomes reddened or irritated, stop using the CHG and notify one of our RNs at [phone].   Please shower with the CHG soap starting 4 days before surgery using the following schedule:     Please keep in mind the following:  You may shave your face at any point before/day of surgery.  Place clean sheets on your bed the day you start using CHG soap. Use a clean washcloth (not used since being washed) for each shower. DO NOT sleep with pets once you start using the CHG.   CHG Shower Instructions:  Wash your face and private area with normal soap. If you choose to wash  your hair, wash first with your normal shampoo.  After you use shampoo/soap, rinse your hair and body thoroughly to remove shampoo/soap residue.  Turn the water OFF and apply about 3 tablespoons (45 ml) of CHG soap to a CLEAN washcloth.  Apply CHG soap ONLY FROM YOUR NECK DOWN TO YOUR TOES (washing for 3-5 minutes)  DO NOT use CHG soap on face, private areas, open wounds, or sores.  Pay special attention to the area where your surgery is being performed.  If you are having back surgery, having someone wash your back for you may be helpful. Wait 2 minutes after CHG soap is applied, then you may rinse off the CHG soap.  Pat dry with a clean towel  Put on clean clothes/pajamas   If you choose to wear lotion, please  use ONLY the CHG-compatible lotions that are listed below.  Additional instructions for the day of surgery: DO NOT APPLY any lotions, deodorants or cologne.   Do not bring valuables to the hospital. [facility] is not responsible for any belongings/valuables. Do not wear jewelry  Put on clean/comfortable clothes.  Please brush your teeth.  Ask your nurse before applying any prescription medications to the skin.     CHG Compatible Lotions   Aveeno Moisturizing lotion  Cetaphil Moisturizing Cream  Cetaphil Moisturizing Lotion  Clairol Herbal Essence Moisturizing Lotion, Dry Skin  Clairol Herbal Essence Moisturizing Lotion, Extra Dry Skin  Clairol Herbal Essence Moisturizing Lotion, Normal Skin  Curel Age Defying Therapeutic Moisturizing Lotion with Alpha Hydroxy  Curel Extreme Care Body Lotion  Curel Soothing Hands Moisturizing Hand Lotion  Curel Therapeutic Moisturizing Cream, Fragrance-Free  Curel Therapeutic Moisturizing Lotion, Fragrance-Free  Curel Therapeutic Moisturizing Lotion, Original Formula  Eucerin Daily Replenishing Lotion  Eucerin Dry Skin Therapy Plus Alpha Hydroxy Crme  Eucerin Dry Skin Therapy Plus Alpha Hydroxy Lotion  Eucerin Original Crme  Eucerin  Original Lotion  Eucerin Plus Crme Eucerin Plus Lotion  Eucerin TriLipid Replenishing Lotion  Keri Anti-Bacterial Hand Lotion  Keri Deep Conditioning Original Lotion Dry Skin Formula Softly Scented  Keri Deep Conditioning Original Lotion, Fragrance Free Sensitive Skin Formula  Keri Lotion Fast Absorbing Fragrance Free Sensitive Skin Formula  Keri Lotion Fast Absorbing Softly Scented Dry Skin Formula  Keri Original Lotion  Keri Skin Renewal Lotion Keri Silky Smooth Lotion  Keri Silky Smooth Sensitive Skin Lotion  Nivea Body Creamy Conditioning Oil  Nivea Body Extra Enriched Lotion  Nivea Body Original Lotion  Nivea Body Sheer Moisturizing Lotion Nivea Crme  Nivea Skin Firming Lotion  NutraDerm 30 Skin Lotion  NutraDerm Skin Lotion  NutraDerm Therapeutic Skin Cream  NutraDerm Therapeutic Skin Lotion  ProShield Protective Hand Cream  Provon moisturizing lotion  Please read over the following fact sheets that you were given.

## 2024-06-05 ENCOUNTER — Inpatient Hospital Stay (HOSPITAL_COMMUNITY)
Admission: RE | Admit: 2024-06-05 | Discharge: 2024-06-05 | Disposition: A | Discharge status: Home / Self Care | Source: Ambulatory Visit

## 2024-06-07 ENCOUNTER — Other Ambulatory Visit: Payer: Self-pay

## 2024-06-07 ENCOUNTER — Encounter (HOSPITAL_COMMUNITY)
Admission: RE | Admit: 2024-06-07 | Discharge: 2024-06-07 | Disposition: A | Discharge status: Home / Self Care | Source: Ambulatory Visit | Attending: Neurological Surgery | Admitting: Neurological Surgery

## 2024-06-07 VITALS — BP 132/86 | HR 65 | Temp 98.1°F | Resp 18 | Ht 72.0 in | Wt 193.0 lb

## 2024-06-07 DIAGNOSIS — Z7984 Long term (current) use of oral hypoglycemic drugs: Secondary | ICD-10-CM | POA: Insufficient documentation

## 2024-06-07 DIAGNOSIS — I4510 Unspecified right bundle-branch block: Secondary | ICD-10-CM | POA: Insufficient documentation

## 2024-06-07 DIAGNOSIS — E785 Hyperlipidemia, unspecified: Secondary | ICD-10-CM | POA: Insufficient documentation

## 2024-06-07 DIAGNOSIS — M4802 Spinal stenosis, cervical region: Secondary | ICD-10-CM | POA: Insufficient documentation

## 2024-06-07 DIAGNOSIS — M5412 Radiculopathy, cervical region: Secondary | ICD-10-CM | POA: Insufficient documentation

## 2024-06-07 DIAGNOSIS — Z01812 Encounter for preprocedural laboratory examination: Secondary | ICD-10-CM | POA: Insufficient documentation

## 2024-06-07 DIAGNOSIS — R001 Bradycardia, unspecified: Secondary | ICD-10-CM | POA: Insufficient documentation

## 2024-06-07 DIAGNOSIS — Z01818 Encounter for other preprocedural examination: Secondary | ICD-10-CM

## 2024-06-07 DIAGNOSIS — K219 Gastro-esophageal reflux disease without esophagitis: Secondary | ICD-10-CM | POA: Insufficient documentation

## 2024-06-07 DIAGNOSIS — I10 Essential (primary) hypertension: Secondary | ICD-10-CM | POA: Insufficient documentation

## 2024-06-07 DIAGNOSIS — Z981 Arthrodesis status: Secondary | ICD-10-CM | POA: Insufficient documentation

## 2024-06-07 DIAGNOSIS — E1169 Type 2 diabetes mellitus with other specified complication: Secondary | ICD-10-CM | POA: Insufficient documentation

## 2024-06-07 LAB — BASIC METABOLIC PANEL WITH GFR
Anion gap: 12 (ref 5–15)
BUN: 7 mg/dL (ref 6–20)
CO2: 27 mmol/L (ref 22–32)
Calcium: 9.7 mg/dL (ref 8.9–10.3)
Chloride: 100 mmol/L (ref 98–111)
Creatinine, Ser: 0.77 mg/dL (ref 0.61–1.24)
GFR, Estimated: >60 mL/min (ref >60)
Glucose, Bld: 155 mg/dL — ABNORMAL HIGH (ref 70–99)
Potassium: 3.8 mmol/L (ref 3.5–5.1)
Sodium: 139 mmol/L (ref 135–145)

## 2024-06-07 LAB — CBC
HCT: 39.4 % (ref 39.0–52.0)
Hemoglobin: 13.8 g/dL (ref 13.0–17.0)
MCH: 31.6 pg (ref 26.0–34.0)
MCHC: 35 g/dL (ref 30.0–36.0)
MCV: 90.2 fL (ref 80.0–100.0)
Platelets: 257 K/uL (ref 150–400)
RBC: 4.37 MIL/uL (ref 4.22–5.81)
RDW: 12.3 % (ref 11.5–15.5)
WBC: 6.4 K/uL (ref 4.0–10.5)
nRBC: 0 % (ref 0.0–0.2)

## 2024-06-07 LAB — HEMOGLOBIN A1C
Hgb A1c MFr Bld: 7.5 % — ABNORMAL HIGH (ref 4.8–5.6)
Mean Plasma Glucose: 168.55 mg/dL

## 2024-06-07 LAB — TYPE AND SCREEN
ABO/RH(D): O POS
Antibody Screen: NEGATIVE

## 2024-06-07 LAB — GLUCOSE, CAPILLARY: Glucose-Capillary: 143 mg/dL — ABNORMAL HIGH (ref 70–99)

## 2024-06-07 LAB — SURGICAL PCR SCREEN
MRSA, PCR: NEGATIVE
Staphylococcus aureus: NEGATIVE

## 2024-06-07 NOTE — Progress Notes (Signed)
 Anesthesia Chart Review:  Case: [id number] Date/Time: 06/08/24 1138   Procedure: ANTERIOR CERVICAL DECOMPRESSION/DISCECTOMY FUSION 4 LEVELS - ACDF - C2-C3 - C3-C4 - C4-C5 - C5-C6   Anesthesia type: General   Diagnosis: Radiculopathy, cervical region [M54.12]   Pre-op diagnosis: Radiculopathy, cervical region   [facility] ROOM [facility] / [facility]   Surgeons: Joshua Alm Hamilton, MD       DISCUSSION: Patient is a 58 year old male scheduled for the above procedure. Surgery was initially scheduled for 05/09/24, but postponed to allow time to get established with new PCP and to start medication for uncontrolled DM2 (A1c 8.5%).   History includes former smoker (quit 01/27/06), HTN, HLD, DM2, GERD, spinal surgery (C6-7 ACDF; s/p C6-7 posterior fusion 03/16/12).   At his original PAT visit on 04/30/24, he was in between primary care providers (former PCP Dr. Britta retired), and he was no longer on fenofibrate , losartan , metformin . BP had been running ~ 143-158/78-92 since April 2024. A1c had increased to 8.5%.    When he was followed by Dr. Britta, he was diagnosed with a right BBB and had a subsequent normal ETT in July 2012. June 2012 TTE showed normal LV/RV dimensions, wall thickness, global function, normal LA/R, and no significant valvular disease. At more recent TTE with Dr. Britta on 12/14/21 for murmur evaluation and showed LV within normal limits, minimally thickened AV with mild AI.    04/30/24 EKG was stable with SR, right BBB.    He had new patient with preoperative visit with Narendra, Nischal, MD on 05/07/24 (until he could get established with new PCP [name], NP at the same practice). Patient reported able to walk up 2 flights of stairs as well as long distances without stopping. He denied chest pain and SOB. BP 124/82, so no antihypertensive prescribed. He was started on metformin  500 mg daily x 7 days then BID. Crestor  10 mg daily also started for HLD (TC 205, HDL 27, LDL 44, VLDL 155,  TRIG 774).  He felt there was not a contraindication for surgery at this time. He had follow-up with [name], NP on 05/29/24. BP 122/64. No new changes made. He has follow-up labs on 07/27/24 with visit on 07/31/24. On 06/07/24, A1c down to 7.5%.   MRI in December 2024 had shown solid fusion at C6-7, right paramedian disc protrusions at C2-3 and C3-4 with canal narrowing, moderate right foraminal stenosis at C2-3 and C4-5, moderate right foraminal stenosis at C5-6 and mild at left C5-6 and left C7-T1. He was in PT ~ April-May 2025 for his neck pain. 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 He was having numbness and tingling in his shoulder and 4th-5th digits with decrease right hand strength. He has received cervical injections with short term relief. Surgery now recommended.     Anesthesia team to evaluate on the day of surgery.    VS: BP 132/86   Pulse 65   Temp 36.7 C   Resp 18   Ht 6' (1.829 m)   Wt 87.5 kg   SpO2 100%   BMI 26.18 kg/m    PROVIDERS: [name], NP is PCP [facility] [place])Narendra, Nischal, MD   on 05/29/24. Previously he was followed by is Britta King, MD with [facility] (see [facility]).    LABS: Labs reviewed: Acceptable for surgery. (all labs ordered are listed, but only abnormal results  are displayed)  Labs Reviewed  GLUCOSE, CAPILLARY - Abnormal; Notable for the following components:      Result Value   Glucose-Capillary 143 (*)    All other components within normal limits  HEMOGLOBIN A1C - Abnormal; Notable for the following components:   Hgb A1c MFr Bld 7.5 (*)    All other components within normal limits  BASIC METABOLIC PANEL WITH GFR - Abnormal; Notable for the following components:   Glucose, Bld 155 (*)    All other components within normal limits  SURGICAL PCR SCREEN  CBC  TYPE AND SCREEN     IMAGES: MRI C-spine 09/28/23  (Canopy/PACS): IMPRESSION: 1. Solid fusion anteriorly at C6-7 without residual or recurrent stenosis. 2. Right paramedian disc protrusions at C2-3 and C3-4 contact and slightly distort the ventral surface the cord. Canal is narrowed to 6.5 mm at C2-3 and 8.5 mm at C3-4 stable from the prior exam. 3. Moderate right foraminal stenosis at C2-3 and C4-is stable 5. 4. Moderate right and mild left foraminal stenosis at C5-6 is stable. 5. Mild left foraminal narrowing at C7-T1 is stable.   CT Head & C-spine 05/31/23: IMPRESSION: - Normal head CT. - No traumatic injury to the cervical spine. Status post PLIF at C6-7. Mild degenerative changes.     EKG: 05/01/24: Sinus bradycardia at 59 bpm Right bundle branch block Abnormal ECG When compared with ECG of 13-Sep-2017 15:23, No significant change was found Confirmed by Kate Bruckner [phone]) on 04/30/2024 8:57:00 PM     CV: Echo 11/24/21 (Dr. Britta): Technical quality: good Resting Measurements: Normal limits Left Ventrical: Within normal limits Mitral Valve: Normal mitral valve Aortic Valve: Aortic valve is minimally thickened mild AI was noted Tricuspid Valve: Within normal limits Pulmonic Valve: Not seen Left Atrium/ Left atrial appendage: No blood clots Atrial septum:  Aorta: Aortic root is within normal limits Complications: No apparent complications     Exercise stress test on 04/28/11 (for RBBB evaluation; scanned under Media tab, Correspondence 03/18/12):  - Negative for angina, arrhythmia, and ischemic ST changes.       Echo on 04/14/11 (scanned under Media tab, Correspondence 03/18/12):  Conclusions:  Normal left and right ventricular dimensions and wall thickness.  Global left and right ventricular function within normal limits.  Left ventricular ejection fraction 50%, no segmental wall motion abnormalities noted.    Right and left atria normal.  No evidence of pericardial effusion.  Valves are normal for age. Atrial  septum intact with no evidence of defect or shunt. Conclusions: No significant valvular disease seen.  Left ventricular function is normal.    Past Medical History:  Diagnosis Date   Arthritis    Blood transfusion    Diabetes mellitus without complication (HCC)    type 2   GERD (gastroesophageal reflux disease)    Hypertension    Peptic ulcer     Past Surgical History:  Procedure Laterality Date   ANTERIOR CERVICAL DECOMP/DISCECTOMY FUSION     2 neck fusions prior to 04/30/24   BUNIONECTOMY     CARPAL TUNNEL RELEASE     right   CERVICAL FUSION     COLONOSCOPY N/A 03/04/2015   Procedure: COLONOSCOPY;  Surgeon: Louanne KANDICE Muse, MD;  Location: [facility] ENDOSCOPY;  Service: Endoscopy;  Laterality: N/A;   HAMMER TOE SURGERY     POSTERIOR CERVICAL FUSION/FORAMINOTOMY  03/16/2012   Procedure: POSTERIOR CERVICAL FUSION/FORAMINOTOMY LEVEL 1;  Surgeon: Alm GORMAN Molt, MD;  Location: [facility] NEUR[facility]S;  Service: Neurosurgery;  Laterality: N/A;  Cervical six-seven posterior cervical fusion with lateral mass screws   SPINE SURGERY      MEDICATIONS:  cholecalciferol (VITAMIN D3) 25 MCG (1000 UNIT) tablet   docusate sodium (COLACE) 100 MG capsule   ELDERBERRY PO   esomeprazole (NEXIUM) 20 MG packet   metFORMIN  (GLUCOPHAGE -XR) 500 MG 24 hr tablet   Multiple Vitamins-Minerals (MENS MULTIVITAMIN PO)   oxyCODONE -acetaminophen  (PERCOCET) 10-325 MG tablet   rosuvastatin  (CRESTOR ) 10 MG tablet   No current facility-administered medications for this encounter.    Isaiah Ruder, PA-C Surgical Short Stay/Anesthesiology [facility] [place] Phone [phone] [facility] Phone [phone] 06/07/2024 12:10 PM

## 2024-06-07 NOTE — Progress Notes (Signed)
 PCP - [name], NP Cardiologist -denies  PPM/ICD - denies Device Orders - n/a Rep Notified - n/a  Chest x-ray - 09/13/2017 EKG - 04/30/2024 Stress Test - 04/28/2011 ECHO - 11/24/2021 Cardiac Cath - denies  Sleep Study - denies CPAP - n/a  Fasting Blood Sugar - pt states that he checks CBG once a week  Last dose of GLP1 agonist- n/a  GLP1 instructions: n/a  Blood Thinner Instructions: n/a Aspirin Instructions: n/a  ERAS Protcol - NPO PRE-SURGERY Ensure or G2- n/a  COVID TEST- n/a   Anesthesia review: yes; medical clearance (Hx of DMII)  Patient denies shortness of breath, fever, cough and chest pain at PAT appointment   All instructions explained to the patient, with a verbal understanding of the material. Patient agrees to go over the instructions while at home for a better understanding. Patient also instructed to self quarantine after being tested for COVID-19. The opportunity to ask questions was provided.

## 2024-06-07 NOTE — Anesthesia Preprocedure Evaluation (Signed)
 Anesthesia Evaluation  Patient identified by MRN, date of birth, ID band Patient awake    Reviewed: Allergy & Precautions, NPO status , Patient's Chart, lab work & pertinent test results, reviewed documented beta blocker date and time   History of Anesthesia Complications Negative for: history of anesthetic complications  Airway Mallampati: II  TM Distance: >3 FB     Dental  (+) Edentulous Upper   Pulmonary neg recent URI, former smoker   breath sounds clear to auscultation       Cardiovascular hypertension, (-) angina (-) CAD and (-) Past MI  Rhythm:Regular Rate:Normal  TTE with Dr. Britta on 12/14/21 for murmur evaluation and showed LV within normal limits, minimally thickened AV with mild AI.    Neuro/Psych neg Seizures  Neuromuscular disease    GI/Hepatic PUD,GERD  Medicated,,(+) neg Cirrhosis        Endo/Other  diabetes, Type 2    Renal/GU Renal disease     Musculoskeletal  (+) Arthritis ,    Abdominal   Peds  Hematology  (+) Blood dyscrasia, anemia   Anesthesia Other Findings   Reproductive/Obstetrics                              Anesthesia Physical Anesthesia Plan  ASA: 3  Anesthesia Plan: General   Post-op Pain Management:    Induction: Intravenous  PONV Risk Score and Plan: 2 and Ondansetron  and Dexamethasone   Airway Management Planned: Oral ETT  Additional Equipment:   Intra-op Plan:   Post-operative Plan: Extubation in OR  Informed Consent: I have reviewed the patients History and Physical, chart, labs and discussed the procedure including the risks, benefits and alternatives for the proposed anesthesia with the patient or authorized representative who has indicated his/her understanding and acceptance.     Dental advisory given  Plan Discussed with: CRNA  Anesthesia Plan Comments: (PAT note written 06/07/2024 by Palmer Fahrner, PA-C.  )          Anesthesia Quick Evaluation

## 2024-06-08 ENCOUNTER — Encounter (HOSPITAL_COMMUNITY): Payer: Self-pay | Admitting: Neurological Surgery

## 2024-06-08 ENCOUNTER — Inpatient Hospital Stay (HOSPITAL_COMMUNITY)

## 2024-06-08 ENCOUNTER — Encounter (HOSPITAL_COMMUNITY): Admitting: Vascular Surgery

## 2024-06-08 ENCOUNTER — Encounter (HOSPITAL_COMMUNITY)
Admission: RE | Disposition: A | Discharge status: Home / Self Care | Payer: Self-pay | Source: Home / Self Care | Attending: Neurological Surgery

## 2024-06-08 ENCOUNTER — Inpatient Hospital Stay (HOSPITAL_COMMUNITY): Admitting: Anesthesiology

## 2024-06-08 ENCOUNTER — Other Ambulatory Visit: Payer: Self-pay

## 2024-06-08 ENCOUNTER — Inpatient Hospital Stay (HOSPITAL_COMMUNITY)
Admission: RE | Admit: 2024-06-08 | Discharge: 2024-06-09 | DRG: 430 | Disposition: A | Discharge status: Home / Self Care | Payer: Self-pay | Attending: Neurological Surgery | Admitting: Neurological Surgery

## 2024-06-08 DIAGNOSIS — E785 Hyperlipidemia, unspecified: Secondary | ICD-10-CM | POA: Diagnosis present

## 2024-06-08 DIAGNOSIS — E119 Type 2 diabetes mellitus without complications: Secondary | ICD-10-CM | POA: Diagnosis present

## 2024-06-08 DIAGNOSIS — M5011 Cervical disc disorder with radiculopathy,  high cervical region: Principal | ICD-10-CM | POA: Diagnosis present

## 2024-06-08 DIAGNOSIS — M5412 Radiculopathy, cervical region: Secondary | ICD-10-CM

## 2024-06-08 DIAGNOSIS — Z8249 Family history of ischemic heart disease and other diseases of the circulatory system: Secondary | ICD-10-CM

## 2024-06-08 DIAGNOSIS — Z87891 Personal history of nicotine dependence: Secondary | ICD-10-CM

## 2024-06-08 DIAGNOSIS — M4802 Spinal stenosis, cervical region: Secondary | ICD-10-CM | POA: Diagnosis present

## 2024-06-08 DIAGNOSIS — Z833 Family history of diabetes mellitus: Secondary | ICD-10-CM

## 2024-06-08 DIAGNOSIS — I4510 Unspecified right bundle-branch block: Secondary | ICD-10-CM | POA: Diagnosis present

## 2024-06-08 DIAGNOSIS — M4722 Other spondylosis with radiculopathy, cervical region: Secondary | ICD-10-CM | POA: Diagnosis present

## 2024-06-08 DIAGNOSIS — I10 Essential (primary) hypertension: Secondary | ICD-10-CM | POA: Diagnosis present

## 2024-06-08 DIAGNOSIS — Z981 Arthrodesis status: Principal | ICD-10-CM

## 2024-06-08 DIAGNOSIS — Z8261 Family history of arthritis: Secondary | ICD-10-CM

## 2024-06-08 DIAGNOSIS — Z7984 Long term (current) use of oral hypoglycemic drugs: Secondary | ICD-10-CM

## 2024-06-08 DIAGNOSIS — Z79899 Other long term (current) drug therapy: Secondary | ICD-10-CM

## 2024-06-08 DIAGNOSIS — E1169 Type 2 diabetes mellitus with other specified complication: Secondary | ICD-10-CM

## 2024-06-08 DIAGNOSIS — M1990 Unspecified osteoarthritis, unspecified site: Secondary | ICD-10-CM | POA: Diagnosis present

## 2024-06-08 HISTORY — PX: ANTERIOR CERVICAL DECOMPRESSION/DISCECTOMY FUSION 4 LEVELS: SHX5556

## 2024-06-08 LAB — GLUCOSE, CAPILLARY
Glucose-Capillary: 210 mg/dL — ABNORMAL HIGH (ref 70–99)
Glucose-Capillary: 143 mg/dL — ABNORMAL HIGH (ref 70–99)
Glucose-Capillary: 194 mg/dL — ABNORMAL HIGH (ref 70–99)
Glucose-Capillary: 307 mg/dL — ABNORMAL HIGH (ref 70–99)

## 2024-06-08 SURGERY — ANTERIOR CERVICAL DECOMPRESSION/DISCECTOMY FUSION 4 LEVELS
Anesthesia: General | Site: Neck

## 2024-06-08 MED ORDER — ORAL CARE MOUTH RINSE: 15.0000 mL | Freq: Once | OROMUCOSAL | Status: AC

## 2024-06-08 MED ORDER — ROCURONIUM BROMIDE 10 MG/ML (PF) SYRINGE
PREFILLED_SYRINGE | INTRAVENOUS | Status: AC
  Filled 2024-06-08: qty 10

## 2024-06-08 MED ORDER — ACETAMINOPHEN 500 MG PO TABS
ORAL_TABLET | ORAL | Status: AC
  Administered 2024-06-08: 1000 mg via ORAL
  Filled 2024-06-08: qty 2

## 2024-06-08 MED ORDER — CHLORHEXIDINE GLUCONATE 0.12 % MT SOLN
OROMUCOSAL | Status: AC
  Administered 2024-06-08: 15 mL via OROMUCOSAL
  Filled 2024-06-08: qty 15

## 2024-06-08 MED ORDER — SENNA 8.6 MG PO TABS
1.0000 | ORAL_TABLET | Freq: Two times a day (BID) | ORAL | Status: DC
  Administered 2024-06-08: 8.6 mg via ORAL
  Filled 2024-06-08: qty 1

## 2024-06-08 MED ORDER — OXYCODONE HCL 5 MG PO TABS
5.0000 mg | ORAL_TABLET | ORAL | Status: DC | PRN
  Administered 2024-06-08 – 2024-06-09 (×4): 5 mg via ORAL
  Filled 2024-06-08 (×4): qty 1

## 2024-06-08 MED ORDER — CEFAZOLIN SODIUM-DEXTROSE 2-4 GM/100ML-% IV SOLN
2.0000 g | INTRAVENOUS | Status: AC
  Administered 2024-06-08: 2 g via INTRAVENOUS

## 2024-06-08 MED ORDER — INSULIN ASPART 100 UNIT/ML IJ SOLN
0.0000 [IU] | INTRAMUSCULAR | Status: DC | PRN
  Administered 2024-06-08: 4 [IU] via SUBCUTANEOUS

## 2024-06-08 MED ORDER — METHOCARBAMOL 1000 MG/10ML IJ SOLN: 500.0000 mg | Freq: Four times a day (QID) | INTRAMUSCULAR | Status: DC | PRN

## 2024-06-08 MED ORDER — INSULIN ASPART 100 UNIT/ML IJ SOLN
0.0000 [IU] | Freq: Three times a day (TID) | INTRAMUSCULAR | Status: DC
  Administered 2024-06-09: 3 [IU] via SUBCUTANEOUS

## 2024-06-08 MED ORDER — HYDROMORPHONE HCL 1 MG/ML IJ SOLN
0.2500 mg | INTRAMUSCULAR | Status: DC | PRN
  Administered 2024-06-08 (×4): 0.5 mg via INTRAVENOUS

## 2024-06-08 MED ORDER — KETAMINE HCL 50 MG/5ML IJ SOSY
PREFILLED_SYRINGE | INTRAMUSCULAR | Status: DC | PRN
Start: 2024-06-08 — End: 2024-06-08
  Administered 2024-06-08: 10 mg via INTRAVENOUS
  Administered 2024-06-08 (×2): 20 mg via INTRAVENOUS

## 2024-06-08 MED ORDER — MIDAZOLAM HCL 2 MG/2ML IJ SOLN
INTRAMUSCULAR | Status: AC
  Filled 2024-06-08: qty 2

## 2024-06-08 MED ORDER — OXYCODONE HCL 5 MG/5ML PO SOLN: 5.0000 mg | Freq: Once | ORAL | Status: DC | PRN

## 2024-06-08 MED ORDER — INSULIN ASPART 100 UNIT/ML IJ SOLN
0.0000 [IU] | Freq: Every day | INTRAMUSCULAR | Status: DC
  Administered 2024-06-08: 4 [IU] via SUBCUTANEOUS

## 2024-06-08 MED ORDER — ONDANSETRON HCL 4 MG/2ML IJ SOLN: 4.0000 mg | Freq: Four times a day (QID) | INTRAMUSCULAR | Status: DC | PRN

## 2024-06-08 MED ORDER — LACTATED RINGERS IV SOLN: INTRAVENOUS | Status: DC

## 2024-06-08 MED ORDER — METFORMIN HCL ER 500 MG PO TB24
500.0000 mg | ORAL_TABLET | Freq: Two times a day (BID) | ORAL | Status: DC
  Administered 2024-06-08 – 2024-06-09 (×2): 500 mg via ORAL
  Filled 2024-06-08 (×2): qty 1

## 2024-06-08 MED ORDER — MENTHOL 3 MG MT LOZG: 1.0000 | LOZENGE | OROMUCOSAL | Status: DC | PRN

## 2024-06-08 MED ORDER — CEFAZOLIN SODIUM-DEXTROSE 2-4 GM/100ML-% IV SOLN
INTRAVENOUS | Status: AC
  Filled 2024-06-08: qty 100

## 2024-06-08 MED ORDER — FENTANYL CITRATE (PF) 100 MCG/2ML IJ SOLN
INTRAMUSCULAR | Status: AC
Start: 2024-06-08 — End: 2024-06-08
  Filled 2024-06-08: qty 2

## 2024-06-08 MED ORDER — CEFAZOLIN SODIUM-DEXTROSE 2-4 GM/100ML-% IV SOLN
2.0000 g | Freq: Three times a day (TID) | INTRAVENOUS | Status: AC
  Administered 2024-06-08 – 2024-06-09 (×2): 2 g via INTRAVENOUS
  Filled 2024-06-08 (×2): qty 100

## 2024-06-08 MED ORDER — KETAMINE HCL 50 MG/5ML IJ SOSY
PREFILLED_SYRINGE | INTRAMUSCULAR | Status: AC
  Filled 2024-06-08: qty 5

## 2024-06-08 MED ORDER — SODIUM CHLORIDE 0.9% FLUSH: 3.0000 mL | INTRAVENOUS | Status: DC | PRN

## 2024-06-08 MED ORDER — FENTANYL CITRATE (PF) 100 MCG/2ML IJ SOLN
25.0000 ug | INTRAMUSCULAR | Status: DC | PRN
  Administered 2024-06-08 (×3): 50 ug via INTRAVENOUS

## 2024-06-08 MED ORDER — ONDANSETRON HCL 4 MG/2ML IJ SOLN
INTRAMUSCULAR | Status: AC
  Filled 2024-06-08: qty 2

## 2024-06-08 MED ORDER — OXYCODONE HCL 5 MG PO TABS: 5.0000 mg | ORAL_TABLET | Freq: Once | ORAL | Status: DC | PRN

## 2024-06-08 MED ORDER — PROPOFOL 10 MG/ML IV BOLUS
INTRAVENOUS | Status: AC
  Filled 2024-06-08: qty 20

## 2024-06-08 MED ORDER — DEXAMETHASONE SODIUM PHOSPHATE 10 MG/ML IJ SOLN
INTRAMUSCULAR | Status: AC
  Filled 2024-06-08: qty 1

## 2024-06-08 MED ORDER — ACETAMINOPHEN 650 MG RE SUPP: 650.0000 mg | RECTAL | Status: DC | PRN

## 2024-06-08 MED ORDER — 0.9 % SODIUM CHLORIDE (POUR BTL) OPTIME
TOPICAL | Status: DC | PRN
  Administered 2024-06-08: 1000 mL

## 2024-06-08 MED ORDER — DEXAMETHASONE SODIUM PHOSPHATE 4 MG/ML IJ SOLN: 4.0000 mg | Freq: Four times a day (QID) | INTRAMUSCULAR | Status: DC

## 2024-06-08 MED ORDER — PROPOFOL 10 MG/ML IV BOLUS
INTRAVENOUS | Status: DC | PRN
  Administered 2024-06-08: 140 mg via INTRAVENOUS

## 2024-06-08 MED ORDER — HYDROMORPHONE HCL 1 MG/ML IJ SOLN
INTRAMUSCULAR | Status: AC
  Filled 2024-06-08: qty 1

## 2024-06-08 MED ORDER — DIAZEPAM 5 MG/ML IJ SOLN
2.5000 mg | Freq: Once | INTRAMUSCULAR | Status: AC
  Administered 2024-06-08: 2.5 mg via INTRAVENOUS
  Filled 2024-06-08: qty 2

## 2024-06-08 MED ORDER — ONDANSETRON HCL 4 MG/2ML IJ SOLN: 4.0000 mg | Freq: Once | INTRAMUSCULAR | Status: DC | PRN

## 2024-06-08 MED ORDER — BUPIVACAINE HCL (PF) 0.25 % IJ SOLN
INTRAMUSCULAR | Status: AC
  Filled 2024-06-08: qty 30

## 2024-06-08 MED ORDER — FENTANYL CITRATE (PF) 250 MCG/5ML IJ SOLN
INTRAMUSCULAR | Status: DC | PRN
  Administered 2024-06-08: 50 ug via INTRAVENOUS
  Administered 2024-06-08: 100 ug via INTRAVENOUS

## 2024-06-08 MED ORDER — LIDOCAINE 2% (20 MG/ML) 5 ML SYRINGE
INTRAMUSCULAR | Status: DC | PRN
  Administered 2024-06-08: 100 mg via INTRAVENOUS

## 2024-06-08 MED ORDER — FENTANYL CITRATE (PF) 100 MCG/2ML IJ SOLN
INTRAMUSCULAR | Status: AC
  Filled 2024-06-08: qty 2

## 2024-06-08 MED ORDER — DEXMEDETOMIDINE HCL IN NACL 80 MCG/20ML IV SOLN
INTRAVENOUS | Status: AC
  Filled 2024-06-08: qty 20

## 2024-06-08 MED ORDER — HYDROMORPHONE HCL 1 MG/ML IJ SOLN
INTRAMUSCULAR | Status: AC
Start: 2024-06-08 — End: 2024-06-08
  Filled 2024-06-08: qty 1

## 2024-06-08 MED ORDER — SODIUM CHLORIDE 0.9% FLUSH
3.0000 mL | Freq: Two times a day (BID) | INTRAVENOUS | Status: DC
  Administered 2024-06-08: 3 mL via INTRAVENOUS

## 2024-06-08 MED ORDER — FENTANYL CITRATE (PF) 250 MCG/5ML IJ SOLN
INTRAMUSCULAR | Status: AC
  Filled 2024-06-08: qty 5

## 2024-06-08 MED ORDER — CHLORHEXIDINE GLUCONATE 0.12 % MT SOLN: 15.0000 mL | Freq: Once | OROMUCOSAL | Status: AC

## 2024-06-08 MED ORDER — MIDAZOLAM HCL 2 MG/2ML IJ SOLN
INTRAMUSCULAR | Status: DC | PRN
  Administered 2024-06-08: 2 mg via INTRAVENOUS

## 2024-06-08 MED ORDER — CHLORHEXIDINE GLUCONATE CLOTH 2 % EX PADS: 6.0000 | MEDICATED_PAD | Freq: Once | CUTANEOUS | Status: DC

## 2024-06-08 MED ORDER — ACETAMINOPHEN 10 MG/ML IV SOLN: 1000.0000 mg | Freq: Once | INTRAVENOUS | Status: DC | PRN

## 2024-06-08 MED ORDER — OXYCODONE-ACETAMINOPHEN 10-325 MG PO TABS: 1.0000 | ORAL_TABLET | ORAL | Status: DC | PRN

## 2024-06-08 MED ORDER — DEXMEDETOMIDINE HCL IN NACL 80 MCG/20ML IV SOLN
INTRAVENOUS | Status: DC | PRN
Start: 2024-06-08 — End: 2024-06-08
  Administered 2024-06-08: 4 ug via INTRAVENOUS

## 2024-06-08 MED ORDER — INSULIN ASPART 100 UNIT/ML IJ SOLN
INTRAMUSCULAR | Status: AC
  Filled 2024-06-08: qty 1

## 2024-06-08 MED ORDER — SUGAMMADEX SODIUM 200 MG/2ML IV SOLN
INTRAVENOUS | Status: DC | PRN
  Administered 2024-06-08: 200 mg via INTRAVENOUS

## 2024-06-08 MED ORDER — THROMBIN 5000 UNITS EX SOLR: OROMUCOSAL | Status: DC | PRN

## 2024-06-08 MED ORDER — ACETAMINOPHEN 325 MG PO TABS
650.0000 mg | ORAL_TABLET | ORAL | Status: DC | PRN
Start: 2024-06-08 — End: 2024-06-09

## 2024-06-08 MED ORDER — LIDOCAINE 2% (20 MG/ML) 5 ML SYRINGE
INTRAMUSCULAR | Status: AC
  Filled 2024-06-08: qty 5

## 2024-06-08 MED ORDER — DEXAMETHASONE 4 MG PO TABS
4.0000 mg | ORAL_TABLET | Freq: Four times a day (QID) | ORAL | Status: DC
  Administered 2024-06-08 – 2024-06-09 (×3): 4 mg via ORAL
  Filled 2024-06-08 (×3): qty 1

## 2024-06-08 MED ORDER — ACETAMINOPHEN 500 MG PO TABS: 1000.0000 mg | ORAL_TABLET | ORAL | Status: AC

## 2024-06-08 MED ORDER — POTASSIUM CHLORIDE IN NACL 20-0.9 MEQ/L-% IV SOLN
INTRAVENOUS | Status: DC
  Filled 2024-06-08: qty 1000

## 2024-06-08 MED ORDER — SODIUM CHLORIDE 0.9 % IV SOLN
250.0000 mL | INTRAVENOUS | Status: DC
  Administered 2024-06-08: 250 mL via INTRAVENOUS

## 2024-06-08 MED ORDER — DEXAMETHASONE SODIUM PHOSPHATE 10 MG/ML IJ SOLN
INTRAMUSCULAR | Status: DC | PRN
  Administered 2024-06-08: 10 mg via INTRAVENOUS

## 2024-06-08 MED ORDER — PHENOL 1.4 % MT LIQD
1.0000 | OROMUCOSAL | Status: DC | PRN
Start: 2024-06-08 — End: 2024-06-09
  Filled 2024-06-08: qty 177

## 2024-06-08 MED ORDER — ONDANSETRON HCL 4 MG PO TABS: 4.0000 mg | ORAL_TABLET | Freq: Four times a day (QID) | ORAL | Status: DC | PRN

## 2024-06-08 MED ORDER — MORPHINE SULFATE (PF) 2 MG/ML IV SOLN
2.0000 mg | INTRAVENOUS | Status: DC | PRN
  Administered 2024-06-08: 2 mg via INTRAVENOUS
  Filled 2024-06-08: qty 1

## 2024-06-08 MED ORDER — THROMBIN 5000 UNITS EX KIT
PACK | CUTANEOUS | Status: AC
  Filled 2024-06-08: qty 1

## 2024-06-08 MED ORDER — BUPIVACAINE HCL (PF) 0.25 % IJ SOLN
INTRAMUSCULAR | Status: DC | PRN
  Administered 2024-06-08: 6 mL

## 2024-06-08 MED ORDER — ONDANSETRON HCL 4 MG/2ML IJ SOLN
INTRAMUSCULAR | Status: DC | PRN
  Administered 2024-06-08: 4 mg via INTRAVENOUS

## 2024-06-08 MED ORDER — ROCURONIUM BROMIDE 10 MG/ML (PF) SYRINGE
PREFILLED_SYRINGE | INTRAVENOUS | Status: DC | PRN
  Administered 2024-06-08: 80 mg via INTRAVENOUS

## 2024-06-08 MED ORDER — GABAPENTIN 300 MG PO CAPS
300.0000 mg | ORAL_CAPSULE | ORAL | Status: AC
  Administered 2024-06-08: 300 mg via ORAL
  Filled 2024-06-08: qty 1

## 2024-06-08 MED ORDER — OXYCODONE-ACETAMINOPHEN 5-325 MG PO TABS
1.0000 | ORAL_TABLET | ORAL | Status: DC | PRN
  Administered 2024-06-08 – 2024-06-09 (×4): 1 via ORAL
  Filled 2024-06-08 (×4): qty 1

## 2024-06-08 MED ORDER — METHOCARBAMOL 500 MG PO TABS
500.0000 mg | ORAL_TABLET | Freq: Four times a day (QID) | ORAL | Status: DC | PRN
  Administered 2024-06-08 – 2024-06-09 (×2): 500 mg via ORAL
  Filled 2024-06-08 (×2): qty 1

## 2024-06-08 SURGICAL SUPPLY — 48 items
ALLOGRAFT BONESTRIP KORE 2.5X5 (Bone Implant) IMPLANT
BAG COUNTER SPONGE SURGICOUNT (BAG) ×1 IMPLANT
BAND RUBBER #18 3X1/16 STRL (MISCELLANEOUS) ×2 IMPLANT
BASKET BONE COLLECTION (BASKET) ×1 IMPLANT
BENZOIN TINCTURE PRP APPL 2/3 (GAUZE/BANDAGES/DRESSINGS) ×1 IMPLANT
BUR CARBIDE MATCH 3.0 (BURR) ×1 IMPLANT
CANISTER SUCTION 3000ML PPV (SUCTIONS) ×1 IMPLANT
DRAIN 7X20 FLAT PERF LF SIL ST (DRAIN) IMPLANT
DRAPE C-ARM 42X72 X-RAY (DRAPES) ×2 IMPLANT
DRAPE LAPAROTOMY 100X72 PEDS (DRAPES) ×1 IMPLANT
DRAPE MICROSCOPE SLANT 54X150 (MISCELLANEOUS) IMPLANT
DRSG OPSITE POSTOP 4X6 (GAUZE/BANDAGES/DRESSINGS) IMPLANT
DURAPREP 6ML APPLICATOR 50/CS (WOUND CARE) ×1 IMPLANT
ELECT COATED BLADE 2.86 ST (ELECTRODE) ×1 IMPLANT
ELECTRODE REM PT RTRN 9FT ADLT (ELECTROSURGICAL) ×1 IMPLANT
EVACUATOR SILICONE 100CC (DRAIN) IMPLANT
GAUZE 4X4 16PLY ~~LOC~~+RFID DBL (SPONGE) IMPLANT
GLOVE BIO SURGEON STRL SZ7 (GLOVE) ×1 IMPLANT
GLOVE BIO SURGEON STRL SZ8 (GLOVE) ×1 IMPLANT
GLOVE BIOGEL PI IND STRL 7.0 (GLOVE) ×1 IMPLANT
GOWN STRL REUS W/ TWL LRG LVL3 (GOWN DISPOSABLE) ×1 IMPLANT
GOWN STRL REUS W/ TWL XL LVL3 (GOWN DISPOSABLE) ×1 IMPLANT
GOWN STRL REUS W/TWL 2XL LVL3 (GOWN DISPOSABLE) IMPLANT
HEMOSTAT POWDER KIT SURGIFOAM (HEMOSTASIS) ×1 IMPLANT
KIT BASIN OR (CUSTOM PROCEDURE TRAY) ×1 IMPLANT
KIT TURNOVER KIT B (KITS) ×1 IMPLANT
NDL HYPO 25X1 1.5 SAFETY (NEEDLE) ×1 IMPLANT
NDL SPNL 20GX3.5 QUINCKE YW (NEEDLE) ×1 IMPLANT
NEEDLE HYPO 25X1 1.5 SAFETY (NEEDLE) ×1 IMPLANT
NEEDLE SPNL 20GX3.5 QUINCKE YW (NEEDLE) ×1 IMPLANT
NS IRRIG 1000ML POUR BTL (IV SOLUTION) ×1 IMPLANT
PACK LAMINECTOMY NEURO (CUSTOM PROCEDURE TRAY) ×1 IMPLANT
PAD ARMBOARD POSITIONER FOAM (MISCELLANEOUS) ×1 IMPLANT
PIN DISTRACTION 14MM (PIN) ×2 IMPLANT
PLATE ACP INSIGNIA 78 4L (Plate) IMPLANT
PUTTY DBM 5CC (Putty) IMPLANT
SCREW VA SINGLE LEAD 4X16 (Screw) IMPLANT
SPACER IDENT 7X18X16 7D (Spacer) IMPLANT
SPACER IDENTITI 7X18X16 7D (Spacer) IMPLANT
SPACER IDENTITI 8X18X16 7D (Spacer) IMPLANT
SPONGE INTESTINAL PEANUT (DISPOSABLE) ×1 IMPLANT
SPONGE SURGIFOAM ABS GEL 100 (HEMOSTASIS) IMPLANT
STRIP CLOSURE SKIN 1/2X4 (GAUZE/BANDAGES/DRESSINGS) ×1 IMPLANT
SUT VIC AB 3-0 SH 8-18 (SUTURE) ×1 IMPLANT
SUT VIC AB 4-0 PS2 18 (SUTURE) IMPLANT
TOWEL GREEN STERILE (TOWEL DISPOSABLE) ×1 IMPLANT
TOWEL GREEN STERILE FF (TOWEL DISPOSABLE) ×1 IMPLANT
WATER STERILE IRR 1000ML POUR (IV SOLUTION) ×1 IMPLANT

## 2024-06-08 NOTE — Progress Notes (Signed)
 Orthopedic Tech Progress Note Patient Details:  Eric Owen. 1965-12-20 [id number]  Ortho Devices Type of Ortho Device: Soft collar Ortho Device/Splint Interventions: Ordered, Application, Adjustment   Post Interventions Patient Tolerated: Well Instructions Provided: Care of device, Adjustment of device  Eric Owen 06/08/2024, 6:05 PM

## 2024-06-08 NOTE — Anesthesia Procedure Notes (Signed)
 Procedure Name: Intubation Date/Time: 06/08/2024 11:59 AM  Performed by: Alayziah Tangeman A, CRNAPre-anesthesia Checklist: Patient identified, Emergency Drugs available, Suction available and Patient being monitored Patient Re-evaluated:Patient Re-evaluated prior to induction Oxygen Delivery Method: Circle System Utilized Preoxygenation: Pre-oxygenation with 100% oxygen Induction Type: IV induction Ventilation: Mask ventilation without difficulty Laryngoscope Size: Glidescope and 4 Grade View: Grade II Tube type: Oral Number of attempts: 1 Airway Equipment and Method: Stylet, Oral airway and Video-laryngoscopy Placement Confirmation: ETT inserted through vocal cords under direct vision, positive ETCO2 and breath sounds checked- equal and bilateral Secured at: 22 cm Tube secured with: Tape Dental Injury: Teeth and Oropharynx as per pre-operative assessment

## 2024-06-08 NOTE — H&P (Signed)
 Subjective:   Patient is a 58 y.o. male admitted for neck and R arm pain. The patient first presented to me with complaints of neck pain. Onset of symptoms was a few months ago. The pain is described as aching and occurs all day. The pain is rated severe, and is located in the neck and radiates to the RUE. The symptoms have been progressive. Symptoms are exacerbated by extending head backwards, and are relieved by none.  Previous work up includes cervical spine x-rays, results: severe arthritis changes and MRI of cervical spine, results: spinal stenosis.  Past Medical History:  Diagnosis Date   Arthritis    Blood transfusion    Diabetes mellitus without complication (HCC)    type 2   GERD (gastroesophageal reflux disease)    Hypertension    Peptic ulcer     Past Surgical History:  Procedure Laterality Date   ANTERIOR CERVICAL DECOMP/DISCECTOMY FUSION     2 neck fusions prior to 04/30/24   BUNIONECTOMY     CARPAL TUNNEL RELEASE     right   CERVICAL FUSION     COLONOSCOPY N/A 03/04/2015   Procedure: COLONOSCOPY;  Surgeon: Louanne KANDICE Muse, MD;  Location: [facility] ENDOSCOPY;  Service: Endoscopy;  Laterality: N/A;   HAMMER TOE SURGERY     POSTERIOR CERVICAL FUSION/FORAMINOTOMY  03/16/2012   Procedure: POSTERIOR CERVICAL FUSION/FORAMINOTOMY LEVEL 1;  Surgeon: Alm GORMAN Molt, MD;  Location: [facility];  Service: Neurosurgery;  Laterality: N/A;  Cervical six-seven posterior cervical fusion with lateral mass screws   SPINE SURGERY      Allergies  Allergen Reactions   Aspirin Nausea Only    Social History   Tobacco Use   Smoking status: Former    Current packs/day: 0.00    Average packs/day: 0.4 packs/day for 18.0 years (7.2 ttl pk-yrs)    Types: Cigarettes    Start date: 01/28/1988    Quit date: 04/23/2006    Years since quitting: 18.1   Smokeless tobacco: Never  Substance Use Topics   Alcohol use: Yes    Alcohol/week: 2.0 standard drinks of alcohol    Types: 2 Glasses of wine  per week    Comment: maybe once a week, occasionally    Family History  Problem Relation Age of Onset   Arthritis Mother    Heart disease Mother    Diabetes Sister    Prior to Admission medications   Medication Sig Start Date End Date Taking? Authorizing Provider  cholecalciferol (VITAMIN D3) 25 MCG (1000 UNIT) tablet Take 1 tablet (1,000 Units total) by mouth daily. 06/07/22  Yes Masoud, Sheralyn, MD  docusate sodium (COLACE) 100 MG capsule Take 100 mg by mouth daily as needed for mild constipation.   Yes [provider]  ELDERBERRY PO Take 1 capsule by mouth daily.   Yes [provider]  esomeprazole (NEXIUM) 20 MG packet Take 20 mg by mouth daily as needed (acid reflux).   Yes [provider]  metFORMIN  (GLUCOPHAGE -XR) 500 MG 24 hr tablet Take 1 tablet (500 mg total) by mouth daily with breakfast for 7 days, THEN 1 tablet (500 mg total) 2 (two) times daily with a meal. TAKE 1 TABLET(500 MG) BY MOUTH DAILY WITH BREAKFAST. Patient taking differently: Take 500 mg by mouth two times daily with a meal. 05/07/24 06/28/24 Yes Onesimo Claude, MD  Multiple Vitamins-Minerals (MENS MULTIVITAMIN PO) Take 1 tablet by mouth daily.   Yes [provider]  oxyCODONE -acetaminophen  (PERCOCET) 10-325 MG tablet Take 1  tablet by mouth 4 (four) times daily as needed for pain. 04/16/24  Yes [provider]  rosuvastatin  (CRESTOR ) 10 MG tablet Take 1 tablet (10 mg total) by mouth daily. 05/08/24  Yes Onesimo Claude, MD     Review of Systems  Positive ROS: neg  All other systems have been reviewed and were otherwise negative with the exception of those mentioned in the HPI and as above.  Objective: Vital signs in last 24 hours: Temp:  [98.2 F (36.8 C)] 98.2 F (36.8 C) (08/22 0952) Pulse Rate:  [74] 74 (08/22 0952) Resp:  [20] 20 (08/22 0952) BP: (137)/(82) 137/82 (08/22 0952) SpO2:  [97 %] 97 % (08/22 0952) Weight:  [88 kg] 88 kg (08/22 0952)  General  Appearance: Alert, cooperative, no distress, appears stated age Head: Normocephalic, without obvious abnormality, atraumatic Eyes: PERRL, conjunctiva/corneas clear, EOM's intact      Neck: Supple, symmetrical, trachea midline, Back: Symmetric, no curvature, ROM normal, no CVA tenderness Lungs:  respirations unlabored Heart: Regular rate and rhythm Abdomen: Soft, non-tender Extremities: Extremities normal, atraumatic, no cyanosis or edema Pulses: 2+ and symmetric all extremities Skin: Skin color, texture, turgor normal, no rashes or lesions  NEUROLOGIC:  Mental status: Alert and oriented x4, no aphasia, good attention span, fund of knowledge and memory  Motor Exam - grossly normal Sensory Exam - grossly normal Reflexes: 2+ Coordination - grossly normal Gait - grossly normal Balance - grossly normal Cranial Nerves: I: smell Not tested  II: visual acuity  OS: nl    OD: nl  II: visual fields Full to confrontation  II: pupils Equal, round, reactive to light  III,VII: ptosis None  III,IV,VI: extraocular muscles  Full ROM  V: mastication Normal  V: facial light touch sensation  Normal  V,VII: corneal reflex  Present  VII: facial muscle function - upper  Normal  VII: facial muscle function - lower Normal  VIII: hearing Not tested  IX: soft palate elevation  Normal  IX,X: gag reflex Present  XI: trapezius strength  5/5  XI: sternocleidomastoid strength 5/5  XI: neck flexion strength  5/5  XII: tongue strength  Normal    Data Review Lab Results  Component Value Date   WBC 6.4 06/07/2024   HGB 13.8 06/07/2024   HCT 39.4 06/07/2024   MCV 90.2 06/07/2024   PLT 257 06/07/2024   Lab Results  Component Value Date   NA 139 06/07/2024   K 3.8 06/07/2024   CL 100 06/07/2024   CO2 27 06/07/2024   BUN 7 06/07/2024   CREATININE 0.77 06/07/2024   GLUCOSE 155 (H) 06/07/2024   Lab Results  Component Value Date   INR 1.00 03/09/2012    Assessment:   Cervical neck pain with  herniated nucleus pulposus/ spondylosis/ stenosis at C2-3 C3-4 C4-5 C5-6. Estimated body mass index is 26.31 kg/m as calculated from the following:   Height as of this encounter: 6' (1.829 m).   Weight as of this encounter: 88 kg.  Patient has failed conservative therapy. Planned surgery : ACDF with plate R7-6 R6-5 C4-5 C5-6  Plan:   I explained the condition and procedure to the patient and answered any questions.  Patient wishes to proceed with procedure as planned. Understands risks/ benefits/ and expected or typical outcomes.  Alm GORMAN Molt 06/08/2024 11:18 AM

## 2024-06-08 NOTE — Plan of Care (Signed)

## 2024-06-08 NOTE — Progress Notes (Signed)
   06/08/24 1651  Spiritual Encounters  Type of Visit Initial  Care provided to: Family  Reason for visit Routine spiritual support  OnCall Visit No   Provided spiritual support to family waiting for patient to be roomed. Patient had surgery and is on his way to 3C[facility]. Met with family waiting in the hallway unable to locate patient. Escorted family to chapel to collect themselves until patient is roomed and ready for visitors.

## 2024-06-08 NOTE — Op Note (Signed)
 06/08/2024  2:29 PM  PATIENT:  Eric Owen.  58 y.o. 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 Decompressive anterior cervical discectomy C2-3 C3-4 C4-5 C5-6, 2. Anterior cervical arthrodesis C2-3 C3-4 C4-5 C5-6 utilizing a PTI interbody cage packed with locally harvested morcellized autologous bone graft and DBM putty, 3. Anterior cervical plating C2-C6 inclusive utilizing a ATEC plate  SURGEON:  Alm Molt, MD  ASSISTANTS: Meyran FNP  ANESTHESIA:   General  EBL: 25 ml  Total I/O In: -  Out: 25 [Blood:25]  BLOOD ADMINISTERED: none  DRAINS: none  SPECIMEN:  none  INDICATION FOR PROCEDURE: This patient presented with neck and R arm pain. Imaging showed spondylosis C2-3 C3-4 C4-5 C5-6 with OPLL and mild to moderate spinal stenosis but no cord compression. The patient tried conservative measures without relief. Pain was debilitating. Recommended ACDF with plating. Patient understood the risks, benefits, and alternatives and potential outcomes and wished to proceed.  He understood that we would not try to be overly aggressive with the decompression at C2-3 with the OPLL since he was not myelopathic and did not have cord compression and we felt that fusion at that level would likely suffice.  He agreed to this plan  Findings at surgery: We originally had his surgery authorized for the use of cortical cancellous allograft bone at each level, but the depth of that bone is only 11 mm and after the first level decompression we used the trial, and it simply looks too small in the disc space.  The large footprint for the PTI cage is 16 x 18 which would give us  an extra 5 mm of coverage of the endplates allowing for a better chance of arthrodesis.  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  Therefore we decided to move forward with PTI cages to get us  a better footprint in the interspace.  PROCEDURE DETAILS: Patient was brought to the operating room placed under general endotracheal anesthesia. Patient was placed in the supine position on the operating room table. The neck was prepped with Duraprep and draped in a sterile fashion.   Three cc of local anesthesia was injected and a transverse incision was made on the right side of the neck.  Dissection was carried down thru the subcutaneous tissue and the platysma was  elevated, opened, and undermined with Metzenbaum scissors.  Dissection was then carried out thru an avascular plane leaving the sternocleidomastoid carotid artery and jugular vein laterally and the trachea and esophagus medially with the assistance of my nurse practitioner. The ventral aspect of the vertebral column was identified and a localizing x-ray was taken. The C5-6 level was identified and all in the room agreed with the level. The longus colli muscles were then elevated and the retractor was placed with the assistance of my nurse practitioner. The annulus was incised and the disc space entered. Discectomy was performed with micro-curettes and pituitary rongeurs. I then used the high-speed drill to drill the endplates down to the level of the posterior longitudinal ligament. The drill shavings were saved in a mucous trap for later arthrodesis. The operating microscope was draped and brought into the field provided additional magnification, illumination and visualization.  Discectomy was continued posteriorly thru the disc space. Posterior longitudinal ligament was opened with a nerve hook, and then removed along with disc herniation and osteophytes, decompressing the spinal canal and thecal sac. We then continued to remove osteophytic  overgrowth and disc material decompressing the neural foramina and exiting nerve roots bilaterally. The scope was angled up and down to help decompress and undercut the vertebral bodies. Once the decompression was completed we could pass a nerve hook circumferentially to assure adequate decompression in the midline and in the neural foramina. So by both visualization and palpation we felt we had an adequate decompression of the neural elements.  As stated above, we used a 11 mm trial to match allograft structural grafts, and it simply was too small for his interspace.  It barely filled half of the interspace.  We felt this was too small and with this being a 4 level ACDF the risk of pseudoarthrosis would be quite high.  Therefore to get better endplate coverage we went with the large footprint titanium cage for each level.  This gave us  significantly more endplate coverage.  We then measured the height of the intravertebral disc space and selected a 8 millimeter PTI interbody cage packed with autograft and DBM putty.  We used 8 mm cages at C4-5 and C5-6 and 7 mm cages at C2-3 and C3-4.  It was then gently positioned in the intravertebral disc space(s) and countersunk. I then used a 78 mm ATEC plate and placed variable angle screws into the vertebral bodies of each level C2-C6 inclusive and locked them into position. The wound was irrigated with bacitracin  solution, checked for hemostasis which was established and confirmed. Once meticulous hemostasis was achieved, we placed a 7 flat JP drain through a separate stab incision, we then proceeded with closure with the assistance of my nurse practitioner. The platysma was closed with interrupted 3-0 undyed Vicryl suture, the subcuticular layer was closed with interrupted 3-0 undyed Vicryl suture. The skin edges were approximated with steristrips. The drapes were removed. A sterile dressing was applied. The patient was then awakened from general anesthesia and transferred  to the recovery room in stable condition. At the end of the procedure all sponge, needle and instrument counts were correct.  Nurse practitioner was scrubbed for the entirety of the case and helped with the exposure, the decompression, the fusion, and the placement of the plate as well as the closure.   PLAN OF CARE: Admit for overnight observation  PATIENT DISPOSITION:  PACU - hemodynamically stable.   Delay start of Pharmacological VTE agent (>24hrs) due to surgical blood loss or risk of bleeding:  yes

## 2024-06-08 NOTE — Progress Notes (Signed)
 7.5 mg of Diazepam  (Valium ) wasted with Leontine Sprang, RN.

## 2024-06-08 NOTE — Transfer of Care (Signed)
 Immediate Anesthesia Transfer of Care Note  Patient: Eric Owen.  Procedure(s) Performed: Anterior Cervical Discectomy and Fusion Cervical two-three, Cervical three-four, Cervical four-five, Cervical five-six (Neck)  Patient Location: PACU  Anesthesia Type:General  Level of Consciousness: awake, alert , and oriented  Airway & Oxygen Therapy: Patient Spontanous Breathing  Post-op Assessment: Report given to RN and Post -op Vital signs reviewed and stable  Post vital signs: Reviewed and stable  Last Vitals:  Vitals Value Taken Time  BP 156/84 06/08/24 14:35  Temp 37.3 C 06/08/24 14:34  Pulse 90 06/08/24 14:38  Resp 17 06/08/24 14:38  SpO2 96 % 06/08/24 14:38  Vitals shown include unfiled device data.  Last Pain:  Vitals:   06/08/24 1023  TempSrc:   PainSc: 4       Patients Stated Pain Goal: 0 (06/08/24 1010)  Complications: No notable events documented.

## 2024-06-09 LAB — GLUCOSE, CAPILLARY: Glucose-Capillary: 171 mg/dL — ABNORMAL HIGH (ref 70–99)

## 2024-06-09 MED ORDER — METHOCARBAMOL 500 MG PO TABS: 500.0000 mg | ORAL_TABLET | Freq: Four times a day (QID) | ORAL | 1 refills | Status: AC | PRN

## 2024-06-09 MED ORDER — POLYETHYLENE GLYCOL 3350 17 G PO PACK: 17.0000 g | PACK | Freq: Every day | ORAL | 0 refills | Status: AC

## 2024-06-09 MED ORDER — OXYCODONE-ACETAMINOPHEN 10-325 MG PO TABS: 1.0000 | ORAL_TABLET | ORAL | 0 refills | Status: AC | PRN

## 2024-06-09 NOTE — Discharge Summary (Signed)
 Physician Discharge Summary     Providing Compassionate, Quality Care - Together   Patient ID: Eric Owen. MRN: [id number] DOB/AGE: 1966-01-25 58 y.o.  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 Joshua on 06/08/2024. He was admitted to 3C08  following recovery from anesthesia in the PACU. His postoperative course has been uncomplicated. He has worked with occupational therapy who feels the patient is ready for discharge home. He is ambulating independently and without difficulty. He is tolerating a normal diet. He is not having any bowel or bladder dysfunction. His pain is well-controlled with oral pain medication. He is ready for discharge home.   Consults: OT  Significant Diagnostic Studies: radiology: DG Cervical Spine 1 View Result Date: 06/08/2024 CLINICAL DATA:  Elective surgery. EXAM: DG CERVICAL SPINE - 1 VIEW COMPARISON:  Preoperative imaging FINDINGS: Three fluoroscopic spot views of the cervical spine submitted from the operating room. Previous lower cervical posterior fusion. Interval anterior C2 through C6 anterior fusion with interbody spacers. Fluoroscopy time 22.8 seconds. Dose 2.8 mGy. IMPRESSION: Intraoperative fluoroscopy during cervical fusion. Electronically Signed   By: Andrea Gasman M.D.   On: 06/08/2024 15:15   DG C-Arm 1-60 Min-No Report Result Date: 06/08/2024 Fluoroscopy was utilized by the requesting physician.  No radiographic interpretation.   DG C-Arm 1-60 Min-No Report Result Date: 06/08/2024 Fluoroscopy was utilized by the requesting physician.  No radiographic interpretation.   DG C-Arm 1-60 Min-No Report Result Date:  06/08/2024 Fluoroscopy was utilized by the requesting physician.  No radiographic interpretation.     Treatments: surgery:  1. Decompressive anterior cervical discectomy C2-3 C3-4 C4-5 C5-6, 2. Anterior cervical arthrodesis C2-3 C3-4 C4-5 C5-6 utilizing a PTI interbody cage packed with locally harvested morcellized autologous bone graft and DBM putty, 3. Anterior cervical plating C2-C6 inclusive utilizing a ATEC plate   Discharge Exam: Blood pressure (!) 161/85, pulse 95, temperature 98.5 F (36.9 C), temperature source Oral, resp. rate 20, height 6' (1.829 m), weight 88 kg, SpO2 100%.  Alert and oriented x 4 PERRLA CN II-XII grossly intact MAE, Strength and sensation intact Incision is covered with Honeycomb dressing and Steri Strips; Dressing is clean, dry, and intact   Disposition: Discharge disposition: 01-Home or Self Care       Discharge Instructions     Call MD for:  difficulty breathing, headache or visual disturbances   Complete by: As directed    Call MD for:  hives   Complete by: As directed    Call MD for:  persistant nausea and vomiting   Complete by: As directed    Call MD for:  redness, tenderness, or signs of infection (pain, swelling, redness, odor or green/yellow discharge around incision site)   Complete by: As directed    Call MD for:  severe uncontrolled pain   Complete by: As directed    Diet - low sodium heart healthy   Complete by: As directed    If the dressing is still on your incision site when you go home, remove it on the third day after your surgery date. Remove dressing if it begins to fall off, or if it is dirty or damaged before the third day.   Complete  by: As directed    Increase activity slowly   Complete by: As directed       Allergies as of 06/09/2024       Reactions   Aspirin Nausea Only        Medication List     TAKE these medications    cholecalciferol 25 MCG (1000 UNIT) tablet Commonly known as: VITAMIN D3 Take 1  tablet (1,000 Units total) by mouth daily.   docusate sodium 100 MG capsule Commonly known as: COLACE Take 100 mg by mouth daily as needed for mild constipation.   ELDERBERRY PO Take 1 capsule by mouth daily.   esomeprazole 20 MG packet Commonly known as: NEXIUM Take 20 mg by mouth daily as needed (acid reflux).   MENS MULTIVITAMIN PO Take 1 tablet by mouth daily.   metFORMIN  500 MG 24 hr tablet Commonly known as: GLUCOPHAGE -XR Take 1 tablet (500 mg total) by mouth daily with breakfast for 7 days, THEN 1 tablet (500 mg total) 2 (two) times daily with a meal. TAKE 1 TABLET(500 MG) BY MOUTH DAILY WITH BREAKFAST. Start taking on: May 07, 2024 What changed: See the new instructions.   methocarbamol  500 MG tablet Commonly known as: ROBAXIN  Take 1 tablet (500 mg total) by mouth every 6 (six) hours as needed for muscle spasms.   oxyCODONE -acetaminophen  10-325 MG tablet Commonly known as: PERCOCET Take 1 tablet by mouth every 4 (four) hours as needed for pain (Post operative pain). What changed:  when to take this reasons to take this   polyethylene glycol 17 g packet Commonly known as: MiraLax  Take 17 g by mouth daily.   rosuvastatin  10 MG tablet Commonly known as: Crestor  Take 1 tablet (10 mg total) by mouth daily.               Discharge Care Instructions  (From admission, onward)           Start     Ordered   06/09/24 0000  If the dressing is still on your incision site when you go home, remove it on the third day after your surgery date. Remove dressing if it begins to fall off, or if it is dirty or damaged before the third day.        06/09/24 0857            Follow-up Information     Joshua Alm Hamilton, MD. Go on 06/21/2024.   Specialty: Neurosurgery Why: First post op appointment is on 06/21/2024 at 11:15 AM. Contact information: [address] [place] [address] [phone]                 Signed: Gerard Beck,  DNP, AGNP-C Nurse Practitioner  [facility] [address], [place], [place] [address] P: [phone]    F: [phone]  06/09/2024, 8:58 AM

## 2024-06-09 NOTE — Evaluation (Signed)
 Occupational Therapy Evaluation Patient Details Name: Eric Owen. MRN: [id number] DOB: 1966-03-22 Today's Date: 06/09/2024   History of Present Illness   Patient underwent a C2-3, C3-4, C4-5, C5-6 ACDF by Dr. Joshua on 06/08/2024.  History of GERD, arthritis, HTN.     Clinical Impressions Pt currently at modified independent to independent for selfcare tasks and mobility.  Pt educated on cervical precautions with handout provided.  Pain around 5-6/10 in his neck during session.  He will discharge home with spouse who works during the day.  He plans to stay on the first level if needed but did not have any issues with mobility or completion of one step during session holding onto the rail.  No further OT needs or DME needs at this time.       If plan is discharge home, recommend the following:   Assist for transportation     Functional Status Assessment   Patient has not had a recent decline in their functional status     Equipment Recommendations   None recommended by OT      Precautions/Restrictions   Precautions Precautions: Cervical Precaution Booklet Issued: Yes (comment) Recall of Precautions/Restrictions: Intact Required Braces or Orthoses: Cervical Brace Cervical Brace: Soft collar;For comfort     Mobility Bed Mobility Overal bed mobility: Modified Independent                  Transfers Overall transfer level: Independent Equipment used: None               General transfer comment: Pt educated on the need to scoot forward to the edge of surfaces and complete standing.  Able to complete without UE use.      Balance Overall balance assessment: Needs assistance   Sitting balance-Leahy Scale: Good     Standing balance support: During functional activity Standing balance-Leahy Scale: Good                             ADL either performed or assessed with clinical judgement   ADL Overall ADL's : Modified independent                                        General ADL Comments: Pt modified independent for selfcare tasks with independence for mobility.  Issued cervical precautions handout and reviewed adaptations for bed mobility, selfcare and transfers.  Pt will have PRN supervision from spouse, but no further OT needs at this time.  Pt has steps inside of his house but has made arrangements to sleep on the first level to start.     Vision Baseline Vision/History: 1 Wears glasses Ability to See in Adequate Light: 0 Adequate Patient Visual Report: No change from baseline Vision Assessment?: No apparent visual deficits     Perception Perception: Within Functional Limits       Praxis Praxis: WFL       Pertinent Vitals/Pain Pain Assessment Pain Assessment: 0-10 Pain Score: 5  Pain Location: neck pain Pain Descriptors / Indicators: Discomfort Pain Intervention(s): Limited activity within patient's tolerance, Repositioned, Monitored during session     Extremity/Trunk Assessment Upper Extremity Assessment Upper Extremity Assessment: Overall WFL for tasks assessed (grip strength 5/5 no other areas checked secondary to precautions)   Lower Extremity Assessment Lower Extremity Assessment: Overall WFL for tasks assessed   Cervical / Trunk Assessment  Cervical / Trunk Assessment: Neck Surgery (cervical collar in place for comfort)   Communication Communication Communication: No apparent difficulties   Cognition Arousal: Alert Behavior During Therapy: WFL for tasks assessed/performed Cognition: No apparent impairments                               Following commands: Intact                  Home Living Family/patient expects to be discharged to:: Private residence Living Arrangements: Spouse/significant other   Type of Home: House Home Access: Level entry (one step up in the house)     Home Layout: Two level;Other (Comment) (will stay on first level  initially) Alternate Level Stairs-Number of Steps: flight Alternate Level Stairs-Rails: Right;Left Bathroom Shower/Tub: Tub/shower unit;Curtain   Bathroom Toilet: Handicapped height Bathroom Accessibility: Yes   Home Equipment: Shower seat          Prior Functioning/Environment Prior Level of Function : Independent/Modified Independent                     Co-evaluation              AM-PAC OT 6 Clicks Daily Activity     Outcome Measure Help from another person eating meals?: None Help from another person taking care of personal grooming?: None Help from another person toileting, which includes using toliet, bedpan, or urinal?: None Help from another person bathing (including washing, rinsing, drying)?: None Help from another person to put on and taking off regular upper body clothing?: None Help from another person to put on and taking off regular lower body clothing?: None 6 Click Score: 24   End of Session Equipment Utilized During Treatment: Cervical collar Nurse Communication: Mobility status  Activity Tolerance: Patient tolerated treatment well Patient left: in bed;with call bell/phone within reach                   Time: 0822-0841 OT Time Calculation (min): 19 min Charges:  OT General Charges $OT Visit: 1 Visit OT Evaluation $OT Eval Low Complexity: 1 Low  Lynwood Constant, OTR/L Acute Rehabilitation Services  Office [phone] 06/09/2024

## 2024-06-09 NOTE — Progress Notes (Signed)

## 2024-06-09 NOTE — Care Management (Signed)
 Patient with order to DC to home today. Unit staff to provide DME needed for home.   No HH needs identified Patient will have family/ friends provide transportation home. No other TOC needs identified for DC

## 2024-06-09 NOTE — Discharge Instructions (Signed)
 Wound Care Keep incision covered and dry until post op day 3. You may remove the Honeycomb dressing on post op day 3. Leave steri-strips on neck.  They will fall off by themselves. Do not put any creams, lotions, or ointments on incision. You are fine to shower. Let water run over incision and pat dry.  Activity Walk each and every day, increasing distance each day. No lifting greater than 5 lbs.  Avoid excessive neck motion. No driving for 2 weeks; may ride as a passenger locally.  Diet Resume your normal diet.   Return to Work Will be discussed at your follow up appointment.  Call Your Doctor If Any of These Occur Redness, drainage, or swelling at the wound.  Temperature greater than 101 degrees. Severe pain not relieved by pain medication. Incision starts to come apart.  Follow Up Appt Call [phone] today for appointment in 2-3 weeks if you don't already have one or for any problems.

## 2024-06-10 LAB — GLUCOSE, CAPILLARY: Glucose-Capillary: 154 mg/dL — ABNORMAL HIGH (ref 70–99)

## 2024-06-11 ENCOUNTER — Telehealth: Payer: Self-pay

## 2024-06-11 ENCOUNTER — Encounter (HOSPITAL_COMMUNITY): Payer: Self-pay | Admitting: Neurological Surgery

## 2024-06-11 NOTE — Anesthesia Postprocedure Evaluation (Signed)
 Anesthesia Post Note  Patient: Eric Owen.  Procedure(s) Performed: Anterior Cervical Discectomy and Fusion Cervical two-three, Cervical three-four, Cervical four-five, Cervical five-six (Neck)     Patient location during evaluation: PACU Anesthesia Type: General Level of consciousness: awake and alert Pain management: pain level controlled Vital Signs Assessment: post-procedure vital signs reviewed and stable Respiratory status: spontaneous breathing, nonlabored ventilation, respiratory function stable and patient connected to nasal cannula oxygen Cardiovascular status: blood pressure returned to baseline and stable Postop Assessment: no apparent nausea or vomiting Anesthetic complications: no   No notable events documented.  Last Vitals:  Vitals:   06/09/24 0335 06/09/24 0813  BP: (!) 153/92 (!) 161/85  Pulse: 85 95  Resp: 18 20  Temp: 37.3 C 36.9 C  SpO2: 98% 100%    Last Pain:  Vitals:   06/09/24 0952  TempSrc:   PainSc: 4                  Lynwood MARLA Cornea

## 2024-06-11 NOTE — Transitions of Care (Post Inpatient/ED Visit) (Signed)
 06/11/2024  Name: Eric Owen. MRN: [id number] DOB: [birth year]  Today's TOC FU Call Status: Today's TOC FU Call Status:: Successful TOC FU Call Completed TOC FU Call Complete Date: 06/11/24 Patient's Name and Date of Birth confirmed.  Transition Care Management Follow-up Telephone Call Date of Discharge: 06/09/24 Discharge Facility: [facility]) Type of Discharge: Inpatient Admission Primary Inpatient Discharge Diagnosis:: S/P cervical spinal fusion How have you been since you were released from the hospital?: Better Any questions or concerns?: No  Items Reviewed: Did you receive and understand the discharge instructions provided?: Yes Medications obtained,verified, and reconciled?: Yes (Medications Reviewed) Any new allergies since your discharge?: No Dietary orders reviewed?: Yes Type of Diet Ordered:: soft diet, low sodium heart healthy diet Do you have support at home?: Yes People in Home [RPT]: spouse Name of Support/Comfort Primary Source: wife  Medications Reviewed Today: Medications Reviewed Today     Reviewed by Rumalda Alan PENNER, RN (Registered Nurse) on 06/11/24 at 1552  Med List Status: <None>   Medication Order Taking? Sig Documenting Provider Last Dose Status Informant  cholecalciferol (VITAMIN D3) 25 MCG (1000 UNIT) tablet [id number] Yes Take 1 tablet (1,000 Units total) by mouth daily. Britta King, MD  Active Self  docusate sodium (COLACE) 100 MG capsule [id number] Yes Take 100 mg by mouth daily as needed for mild constipation. [provider]  Active Self  ELDERBERRY PO [id number] Yes Take 1 capsule by mouth daily. [provider]  Active Self  esomeprazole (NEXIUM) 20 MG packet [id number] Yes Take 20 mg by mouth daily as needed (acid reflux). [provider]  Active Self  metFORMIN  (GLUCOPHAGE -XR) 500 MG 24 hr tablet [id number] Yes Take 1 tablet (500 mg total) by mouth daily with breakfast for 7 days, THEN 1 tablet (500 mg total) 2  (two) times daily with a meal. TAKE 1 TABLET(500 MG) BY MOUTH DAILY WITH BREAKFAST. Narendra, Nischal, MD  Active Self  methocarbamol  (ROBAXIN ) 500 MG tablet [id number] Yes Take 1 tablet (500 mg total) by mouth every 6 (six) hours as needed for muscle spasms. Bergman, Meghan D, NP  Active   Multiple Vitamins-Minerals (MENS MULTIVITAMIN PO) [id number] Yes Take 1 tablet by mouth daily. [provider]  Active Self  oxyCODONE -acetaminophen  (PERCOCET) 10-325 MG tablet [id number] Yes Take 1 tablet by mouth every 4 (four) hours as needed for pain (Post operative pain). Bergman, Meghan D, NP  Active   polyethylene glycol (MIRALAX ) 17 g packet [id number] Yes Take 17 g by mouth daily. Bergman, Meghan D, NP  Active   rosuvastatin  (CRESTOR ) 10 MG tablet [id number] Yes Take 1 tablet (10 mg total) by mouth daily. Onesimo Claude, MD  Active Self            Home Care and Equipment/Supplies: Were Home Health Services Ordered?: No Any new equipment or medical supplies ordered?: No  Functional Questionnaire: Do you need assistance with bathing/showering or dressing?: Yes (wife is assisting with dressing) Do you need assistance with meal preparation?: Yes (wife) Do you need assistance with eating?: No Do you have difficulty maintaining continence: No Do you need assistance with getting out of bed/getting out of a chair/moving?: No Do you have difficulty managing or taking your medications?: No  Follow up appointments reviewed: PCP Follow-up appointment confirmed?: No (will call today for an appointment) Specialist Hospital Follow-up appointment confirmed?: Yes Date of Specialist follow-up appointment?: 06/21/24 Follow-Up Specialty Provider:: Neurosurgery  Dr. Joshua Do you need transportation to your follow-up appointment?: No Do you understand  care options if your condition(s) worsen?: Yes-patient verbalized understanding  SDOH Interventions Today    Flowsheet Row Most Recent Value  SDOH  Interventions   Food Insecurity Interventions Intervention Not Indicated  Housing Interventions Intervention Not Indicated  Transportation Interventions Intervention Not Indicated  Utilities Interventions Intervention Not Indicated    Goals      VBCI Transitions of Care (TOC) Care Plan     Problems:  Recent Hospitalization for treatment of cervical fusion- complaining of neck swelling, sore throat. Dressing intact No Hospital Follow Up Provider appointment Needs PCP follow up- Surgeon appointment scheduled Constipation- no BM since surgery  Goal:  Over the next 30 days, the patient will not experience hospital readmission  Interventions:  Transitions of Care: Doctor Visits  - discussed the importance of doctor visits Post discharge activity limitations prescribed by provider reviewed Post-op wound/incision care reviewed with patient/caregiver Reviewed Signs and symptoms of infection  Surgery (Cervical Fusion): Evaluation of current treatment plan related to cervical surgery assessed patient/caregiver understanding of surgical procedure   reviewed post-operative instructions with patient/caregiver addressed questions about post - surgical incision care  reviewed medications with patient and addressed questions reviewed scheduled provider appointments with patientPCP and surgeon confirmed availability of transportation to all appointments wife to drive performed [id number] assessment    Reviewed concern for constipation, Encouraged patient to continue to drink well and stay hydrated, Reviewed importance of being active and walking, Encouraged patient to take stool softner as well as miralax . Reviewed the importance of not bearing down to have a bowel movement. Encouraged patient to continue to eat soft foods. Reviewed with patient that if he has any shortness of breath, increased swelling or unable to swallow to call 911.  Reviewed importance of removing dressing tomorrow and  leaving steri strips intact.  Reviewed and offered 30 day TOC program and patient consented.   Next appointment scheduled with Davina Green RN  TOC team.   Patient Self Care Activities:  Attend all scheduled provider appointments Call pharmacy for medication refills 3-7 days in advance of running out of medications Call provider office for new concerns or questions  Notify RN Care Manager of [facility] call rescheduling needs Participate in Transition of Care Program/Attend TOC scheduled calls Take medications as prescribed   Take stool softner as prescribe Drink plenty of water Walk Call MD for any signs of infection. Call 911 for difficulty breathing or swallowing  Plan:  Telephone follow up appointment with care management team member scheduled for:  06/20/2024 10 am  with Davina Green The patient has been provided with contact information for the care management team and has been advised to call with any health related questions or concerns.        Reviewed all discharge instructions. Patient to call PCP to make an appointment for hospital follow up. Reviewed and offered 30 day TOC program and patient has consented. Alan Ee, RN, BSN, CEN [employer]- Transition of Care Team.  [facility] [phone]

## 2024-06-20 ENCOUNTER — Other Ambulatory Visit: Payer: Self-pay

## 2024-06-20 NOTE — Transitions of Care (Post Inpatient/ED Visit) (Signed)
 Transition of Care week 2  Visit Note  06/20/2024  Name: Eric Owen. MRN: [id number]          DOB: [birth year]  Situation: Patient enrolled in [facility] 30-day program. Visit completed with patient by telephone.   Background: Patient hospitalized from 06/08/24 to 06/09/24 for cervical fusion     Past Medical History:  Diagnosis Date   Arthritis    Blood transfusion    Diabetes mellitus without complication (HCC)    type 2   GERD (gastroesophageal reflux disease)    Hypertension    Peptic ulcer     Assessment: Patient Reported Symptoms: Cognitive Cognitive Status: No symptoms reported, Alert and oriented to person, place, and time, Insightful and able to interpret abstract concepts, Normal speech and language skills      Neurological Neurological Review of Symptoms: No symptoms reported    HEENT HEENT Symptoms Reported: Sore throat HEENT Management Strategies: Routine screening, Medication therapy, Diet modification HEENT Comment: patient reports still having a mild sore throat and some difficulty with swallowing.  Patient states he is aware this will be the case for a while based on instructions/ advisement by the surgeon. Patient states he continues to remain on a soft diet.    Cardiovascular Cardiovascular Symptoms Reported: No symptoms reported Does patient have uncontrolled Hypertension?: No    Respiratory Respiratory Symptoms Reported: No symptoms reported    Endocrine Endocrine Symptoms Reported: No symptoms reported Is patient diabetic?: Yes Is patient checking blood sugars at home?: Yes (patient reports recent purchase of glucometer) List most recent blood sugar readings, include date and time of day: patient reports blood sugar results the evening of 06/20/24 was 201.    Gastrointestinal Gastrointestinal Symptoms Reported: No symptoms reported Additional Gastrointestinal Details: patient states his constipation has resolved. Gastrointestinal Management Strategies:  Medication therapy, Diet modification (increasing water intake)    Genitourinary Genitourinary Symptoms Reported: No symptoms reported    Integumentary Integumentary Symptoms Reported: Incision Additional Integumentary Details: patient states his incision on the front of his neck is healing.  He states he still has steri strips on the incision site. Denies any signs of infection Skin Management Strategies: Medical device  Musculoskeletal Musculoskelatal Symptoms Reviewed: No symptoms reported        Psychosocial Psychosocial Symptoms Reported: No symptoms reported         There were no vitals filed for this visit.  Medications Reviewed Today     Reviewed by Jerri Glauser E, RN (Registered Nurse) on 06/20/24 at 1019  Med List Status: <None>   Medication Order Taking? Sig Documenting Provider Last Dose Status Informant  cholecalciferol (VITAMIN D3) 25 MCG (1000 UNIT) tablet [id number] Yes Take 1 tablet (1,000 Units total) by mouth daily. Britta King, MD  Active Self  docusate sodium (COLACE) 100 MG capsule [id number] Yes Take 100 mg by mouth daily as needed for mild constipation. [provider]  Active Self  ELDERBERRY PO [id number] Yes Take 1 capsule by mouth daily. [provider]  Active Self  esomeprazole (NEXIUM) 20 MG packet [id number] Yes Take 20 mg by mouth daily as needed (acid reflux). [provider]  Active Self  metFORMIN  (GLUCOPHAGE -XR) 500 MG 24 hr tablet [id number] Yes Take 1 tablet (500 mg total) by mouth daily with breakfast for 7 days, THEN 1 tablet (500 mg total) 2 (two) times daily with a meal. TAKE 1 TABLET(500 MG) BY MOUTH DAILY WITH BREAKFAST. Narendra, Nischal, MD  Active Self  methocarbamol  (ROBAXIN ) 500 MG tablet [id number]  Yes Take 1 tablet (500 mg total) by mouth every 6 (six) hours as needed for muscle spasms. Bergman, Meghan D, NP  Active   Multiple Vitamins-Minerals (MENS MULTIVITAMIN PO) [id number] Yes Take 1 tablet by mouth daily.  [provider]  Active Self  oxyCODONE -acetaminophen  (PERCOCET) 10-325 MG tablet [id number] Yes Take 1 tablet by mouth every 4 (four) hours as needed for pain (Post operative pain). Bergman, Meghan D, NP  Active   polyethylene glycol (MIRALAX ) 17 g packet [id number] Yes Take 17 g by mouth daily. Bergman, Meghan D, NP  Active   rosuvastatin  (CRESTOR ) 10 MG tablet [id number] Yes Take 1 tablet (10 mg total) by mouth daily. Narendra, Nischal, MD  Active Self            Recommendation:   Specialty provider follow-up patient reports appointment for surgery Continue Current Plan of Care  Follow Up Plan:   Telephone follow-up in 1 week 07/27/24  Arvin Seip RN, BSN, CCM [place]  [facility], Population Health Case Manager Phone: [phone]

## 2024-06-20 NOTE — Patient Instructions (Signed)
 Visit Information  Thank you for taking time to visit with me today. Please don't hesitate to contact me if I can be of assistance to you before our next scheduled telephone appointment.  Our next appointment is by telephone on 07/27/24 at 10 am  Following is a copy of your care plan:   Goals Addressed             This Visit's Progress    VBCI Transitions of Care (TOC) Care Plan       Problems:  Recent Hospitalization for treatment of cervical fusion-  sore throat. Dressing intact No Hospital Follow Up Provider appointment Needs PCP follow up- Surgeon appointment scheduled Constipation issue resolved  Goal:  Over the next 30 days, the patient will not experience hospital readmission  Interventions:  Transitions of Care: Post discharge activity limitations prescribed by provider reviewed Post-op wound/incision care reviewed with patient/caregiver Reviewed Signs and symptoms of infection  Surgery (Cervical Fusion): Evaluation of current treatment plan related to cervical surgery reviewed medications with patient and addressed questions reviewed scheduled provider appointments with patientPCP and surgeon Assessed constipation status Rediscussed the importance of not bearing down to have a bowel movement. Encouraged patient to continue to eat soft foods.Discussed examples:  soft scrambled eggs, mashed potatoes, pudding, oatmeal cream of wheat Advised patient that if he has any shortness of breath, increased swelling or unable to swallow to call 911.  Re-discussed signs of infection and assessed for infection symptoms at surgical site.  Confirmed patients follow up visit date with surgeon Assessed pain level.   Advised to take pain medication as prescribed.    Patient Self Care Activities:  Call pharmacy for medication refills 3-7 days in advance of running out of medications Call provider office for new concerns or questions  Notify RN Care Manager of TOC call rescheduling  needs Take medications as prescribed   Continue to participate in the 30 day TOC program Take stool softner as prescribe Drink plenty of water Walk Call MD for any signs of infection. Call 911 for difficulty breathing or swallowing  Plan:  Telephone follow up appointment with care management team member scheduled for:  06/27/2024 10 am   The patient has been provided with contact information for the care management team and has been advised to call with any health related questions or concerns.         Patient verbalizes understanding of instructions and care plan provided today and agrees to view in MyChart. Active MyChart status and patient understanding of how to access instructions and care plan via MyChart confirmed with patient.     The patient has been provided with contact information for the care management team and has been advised to call with any health related questions or concerns.   Please call the care guide team at [phone] if you need to cancel or reschedule your appointment.   Please call the Suicide and Crisis Lifeline: 988 call the [place]  National Suicide Prevention Lifeline: (631)255-3389 or TTY: (801)435-9270 TTY 813-760-0595) to talk to a trained counselor call 1-800-273-TALK (toll free, 24 hour hotline) go to [facility] [address], [place] [phone]) if you are experiencing a Mental Health or Behavioral Health Crisis or need someone to talk to.  Arvin Seip RN, BSN, CCM [employer], Population Health Case Manager Phone: [phone]

## 2024-06-27 ENCOUNTER — Telehealth: Payer: Self-pay

## 2024-06-28 ENCOUNTER — Telehealth: Payer: Self-pay

## 2024-06-28 NOTE — Patient Instructions (Signed)
 Visit Information  Thank you for taking time to visit with me today. Please don't hesitate to contact me if I can be of assistance to you before our next scheduled telephone appointment.  Our next appointment is by telephone on 07/04/24 at 1 pm  Following is a copy of your care plan:   Goals Addressed             This Visit's Progress    VBCI Transitions of Care (TOC) Care Plan       Problems:  Recent Hospitalization for treatment of cervical fusion-   No Hospital Follow Up Provider appointment primary care provider visit scheduled for 07/31/24 Constipation issue resolved  Goal:  Over the next 30 days, the patient will not experience hospital readmission  Interventions:  Transitions of Care: Post discharge activity limitations prescribed by provider reviewed Reviewed Signs and symptoms of infection Assessed for signs of infection at incision site.  Evaluation of current treatment plan related to cervical surgery reviewed medications with patient and addressed questions reviewed scheduled provider appointments with patientPCP and surgeon Assessed constipation status Rediscussed the importance of not bearing down to have a bowel movement. Assessed patients ability to eat solid food and assessed if having ongoing issues with swallowing.  Advised patient that if he has any shortness of breath, increased swelling or unable to swallow to call 911.  Confirmed patients follow up visit date with surgeon- scheduled for 07/21/24 Assessed pain level.   Advised to take pain medication as prescribed.  Reminded patient not to drive when taking his prescribed pain medication   Patient Self Care Activities:  Call pharmacy for medication refills 3-7 days in advance of running out of medications Call provider office for new concerns or questions  Notify RN Care Manager of TOC call rescheduling needs Take medications as prescribed   Continue to participate in the 30 day TOC program Drink  plenty of water Exercise/Walk as per provider recommendation Call MD for any signs of infection. Call 911 for difficulty breathing or swallowing Do not drive when taking your prescribed pain medication  Plan:  Telephone follow up appointment with care management team member scheduled for:  07/04/2024 10 am   The patient has been provided with contact information for the care management team and has been advised to call with any health related questions or concerns.         Patient verbalizes understanding of instructions and care plan provided today and agrees to view in MyChart. Active MyChart status and patient understanding of how to access instructions and care plan via MyChart confirmed with patient.     The patient has been provided with contact information for the care management team and has been advised to call with any health related questions or concerns.   Please call the care guide team at [phone] if you need to cancel or reschedule your appointment.   Please call the Suicide and Crisis Lifeline: 988 call 1-800-273-TALK (toll free, 24 hour hotline) if you are experiencing a Mental Health or Behavioral Health Crisis or need someone to talk to.  Arvin Seip RN, BSN, CCM [employer], Population Health Case Manager Phone: [phone]

## 2024-06-28 NOTE — Transitions of Care (Post Inpatient/ED Visit) (Signed)
 Transition of Care week 3  Visit Note  06/28/2024  Name: Eric Owen. MRN: [id number]          DOB: [birth year]  Situation: Patient enrolled in [facility] 30-day program. Visit completed with patient by telephone.   Background:  Patient hospitalized from 06/08/24 to 06/09/24 for cervical fusion      Past Medical History:  Diagnosis Date   Arthritis    Blood transfusion    Diabetes mellitus without complication (HCC)    type 2   GERD (gastroesophageal reflux disease)    Hypertension    Peptic ulcer     Assessment: Patient Reported Symptoms: Cognitive Cognitive Status: No symptoms reported, Alert and oriented to person, place, and time, Insightful and able to interpret abstract concepts, Normal speech and language skills      Neurological Neurological Review of Symptoms: No symptoms reported    HEENT HEENT Symptoms Reported: Other: HEENT Management Strategies: Routine screening, Medication therapy HEENT Comment: patient states he still has some swelling in his throat area from the surgery.  Patient states he still has some difficulty with his swallowing however he is managing this by chewing food thoroughly and taking smaller bites. He states he is eating solid food now. Patient reports next follow up visit with the surgeon is on 07/21/24    Cardiovascular Cardiovascular Symptoms Reported: No symptoms reported    Respiratory Respiratory Symptoms Reported: No symptoms reported    Endocrine Endocrine Symptoms Reported: No symptoms reported    Gastrointestinal Gastrointestinal Symptoms Reported: No symptoms reported      Genitourinary Genitourinary Symptoms Reported: No symptoms reported    Integumentary Integumentary Symptoms Reported: Incision Additional Integumentary Details: patient states incision is healing well. Skin Management Strategies: Medical device  Musculoskeletal Musculoskelatal Symptoms Reviewed: No symptoms reported        Psychosocial Psychosocial Symptoms  Reported: No symptoms reported         There were no vitals filed for this visit.  Medications Reviewed Today     Reviewed by Ceriah Kohler E, RN (Registered Nurse) on 06/28/24 at 1455  Med List Status: <None>   Medication Order Taking? Sig Documenting Provider Last Dose Status Informant  cholecalciferol (VITAMIN D3) 25 MCG (1000 UNIT) tablet [id number] Yes Take 1 tablet (1,000 Units total) by mouth daily. Britta King, MD  Active Self  docusate sodium (COLACE) 100 MG capsule [id number] Yes Take 100 mg by mouth daily as needed for mild constipation. [provider]  Active Self  ELDERBERRY PO [id number] Yes Take 1 capsule by mouth daily. [provider]  Active Self  esomeprazole (NEXIUM) 20 MG packet [id number] Yes Take 20 mg by mouth daily as needed (acid reflux). [provider]  Active Self  metFORMIN  (GLUCOPHAGE -XR) 500 MG 24 hr tablet [id number] Yes Take 1 tablet (500 mg total) by mouth daily with breakfast for 7 days, THEN 1 tablet (500 mg total) 2 (two) times daily with a meal. TAKE 1 TABLET(500 MG) BY MOUTH DAILY WITH BREAKFAST. Narendra, Nischal, MD  Active Self  methocarbamol  (ROBAXIN ) 500 MG tablet [id number] Yes Take 1 tablet (500 mg total) by mouth every 6 (six) hours as needed for muscle spasms. Bergman, Meghan D, NP  Active   Multiple Vitamins-Minerals (MENS MULTIVITAMIN PO) [id number] Yes Take 1 tablet by mouth daily. [provider]  Active Self  oxyCODONE -acetaminophen  (PERCOCET) 10-325 MG tablet [id number] Yes Take 1 tablet by mouth every 4 (four) hours as needed for pain (Post operative pain).  Patient taking differently: Take  1 tablet by mouth every 4 (four) hours as needed for pain (Post operative pain). Patient states he is taking 5 mg every 4 hours as needed for pain   Bergman, Meghan D, NP  Active   polyethylene glycol (MIRALAX ) 17 g packet [id number] Yes Take 17 g by mouth daily. Bergman, Meghan D, NP  Active   rosuvastatin  (CRESTOR ) 10 MG  tablet [id number] Yes Take 1 tablet (10 mg total) by mouth daily. Narendra, Nischal, MD  Active Self            Goals Addressed             This Visit's Progress    VBCI Transitions of Care (TOC) Care Plan       Problems:  Recent Hospitalization for treatment of cervical fusion-   No Hospital Follow Up Provider appointment primary care provider visit scheduled for 07/31/24 Constipation issue resolved  Goal:  Over the next 30 days, the patient will not experience hospital readmission  Interventions:  Transitions of Care: Post discharge activity limitations prescribed by provider reviewed Reviewed Signs and symptoms of infection Assessed for signs of infection at incision site.  Evaluation of current treatment plan related to cervical surgery reviewed medications with patient and addressed questions reviewed scheduled provider appointments with patientPCP and surgeon Assessed constipation status Rediscussed the importance of not bearing down to have a bowel movement. Assessed patients ability to eat solid food and assessed if having ongoing issues with swallowing.  Advised patient that if he has any shortness of breath, increased swelling or unable to swallow to call 911.  Confirmed patients follow up visit date with surgeon- scheduled for 07/21/24 Assessed pain level.   Advised to take pain medication as prescribed.  Reminded patient not to drive when taking his prescribed pain medication   Patient Self Care Activities:  Call pharmacy for medication refills 3-7 days in advance of running out of medications Call provider office for new concerns or questions  Notify RN Care Manager of TOC call rescheduling needs Take medications as prescribed   Continue to participate in the 30 day TOC program Drink plenty of water Exercise/Walk as per provider recommendation Call MD for any signs of infection. Call 911 for difficulty breathing or swallowing Do not drive when taking your  prescribed pain medication  Plan:  Telephone follow up appointment with care management team member scheduled for:  07/04/2024 10 am   The patient has been provided with contact information for the care management team and has been advised to call with any health related questions or concerns.         Recommendation:   Continue Current Plan of Care  Follow Up Plan:   Telephone follow-up in 1 week 07/04/24 at 10 am  Arvin Seip RN, BSN, CCM [place]  [facility], Population Health Case Manager Phone: [phone]

## 2024-07-04 ENCOUNTER — Telehealth: Payer: Self-pay

## 2024-07-05 ENCOUNTER — Telehealth: Payer: Self-pay

## 2024-07-05 NOTE — Patient Instructions (Signed)
 Visit Information  Thank you for taking time to visit with me today. Please don't hesitate to contact me if I can be of assistance to you before our next scheduled telephone appointment.  Our next appointment is by telephone on 07/11/24 at 11 am  Following is a copy of your care plan:   Goals Addressed             This Visit's Progress    VBCI Transitions of Care (TOC) Care Plan       Problems:  Recent Hospitalization for treatment of cervical fusion-   No Hospital Follow Up Provider appointment primary care provider visit scheduled for 07/31/24 Constipation issue resolved  Goal:  Over the next 30 days, the patient will not experience hospital readmission  Interventions:  Transitions of Care: Assessed healing of incision Reviewed Signs and symptoms of infection Assessed for signs of infection at incision site.  Evaluation of current treatment plan related to cervical surgery reviewed medications with patient and addressed questions reviewed scheduled provider appointments with patientPCP and surgeon Assessed constipation status Assessed patients ability to eat solid food and assessed if having ongoing issues with swallowing.  Advised patient that if he has any shortness of breath, increased swelling or unable to swallow to call 911.  Confirmed patients follow up visit date with surgeon- scheduled for 07/21/24 Assessed pain level.   Advised to take pain medication as prescribed.  Reminded patient not to drive when taking his prescribed pain medication   Patient Self Care Activities:  Call pharmacy for medication refills 3-7 days in advance of running out of medications Call provider office for new concerns or questions  Notify RN Care Manager of TOC call rescheduling needs Take medications as prescribed   Continue to participate in the 30 day TOC program Drink plenty of water Exercise/Walk as per provider recommendation Call MD for any signs of infection. Call 911 for  difficulty breathing or swallowing Do not drive when taking your prescribed pain medication  Plan:  Telephone follow up appointment with care management team member scheduled for:  07/11/2024 11am   The patient has been provided with contact information for the care management team and has been advised to call with any health related questions or concerns.         Patient verbalizes understanding of instructions and care plan provided today and agrees to view in MyChart. Active MyChart status and patient understanding of how to access instructions and care plan via MyChart confirmed with patient.     The patient has been provided with contact information for the care management team and has been advised to call with any health related questions or concerns.   Please call the care guide team at [phone] if you need to cancel or reschedule your appointment.   Please call the Suicide and Crisis Lifeline: 988 call the [place]  National Suicide Prevention Lifeline: 548-525-8661 or TTY: 586-236-3613 TTY 808-254-2160) to talk to a trained counselor call 1-800-273-TALK (toll free, 24 hour hotline) go to [facility] [address], [place] [phone]) if you are experiencing a Mental Health or Behavioral Health Crisis or need someone to talk to.  Arvin Seip RN, BSN, CCM [employer], Population Health Case Manager Phone: [phone]

## 2024-07-05 NOTE — Transitions of Care (Post Inpatient/ED Visit) (Signed)
 Transition of Care week 3  Visit Note  07/05/2024  Name: Eric Owen. MRN: [id number]          DOB: 1965/11/20  Situation: Patient enrolled in [facility] 30-day program. Visit completed with patient by telephone.   Background: Patient hospitalized from 06/08/24 to 06/09/24 for cervical fusion     Past Medical History:  Diagnosis Date   Arthritis    Blood transfusion    Diabetes mellitus without complication (HCC)    type 2   GERD (gastroesophageal reflux disease)    Hypertension    Peptic ulcer     Assessment: Patient Reported Symptoms: Cognitive Cognitive Status: No symptoms reported, Alert and oriented to person, place, and time, Insightful and able to interpret abstract concepts, Normal speech and language skills      Neurological Neurological Review of Symptoms: No symptoms reported    HEENT HEENT Symptoms Reported: Other: HEENT Management Strategies: Routine screening HEENT Comment: patient states he had neck pain early today. He reports using biofreeze and massaging his neck.  Reports pain is better.  Patient states he still continues to have some difficulty with his swallowing however he can tell it is slowly getting better. He states his voice is still a little hoarse as well. He reports follow up visit with the surgeon is 07/21/24.    Cardiovascular Cardiovascular Symptoms Reported: No symptoms reported    Respiratory Respiratory Symptoms Reported: No symptoms reported    Endocrine Endocrine Symptoms Reported: No symptoms reported Is patient diabetic?: Yes Is patient checking blood sugars at home?: Yes List most recent blood sugar readings, include date and time of day: patient reports blood sugar results on 07/04/24  fasting was 176.    Gastrointestinal Gastrointestinal Symptoms Reported: No symptoms reported      Genitourinary Genitourinary Symptoms Reported: No symptoms reported    Integumentary Integumentary Symptoms Reported: Incision Additional Integumentary  Details: patient states incision has healed well.    Musculoskeletal Musculoskelatal Symptoms Reviewed: No symptoms reported        Psychosocial Psychosocial Symptoms Reported: No symptoms reported         There were no vitals filed for this visit.  Medications Reviewed Today     Reviewed by Cammie Faulstich E, RN (Registered Nurse) on 07/05/24 at 1552  Med List Status: <None>   Medication Order Taking? Sig Documenting Provider Last Dose Status Informant  cholecalciferol (VITAMIN D3) 25 MCG (1000 UNIT) tablet [id number] Yes Take 1 tablet (1,000 Units total) by mouth daily. Britta King, MD  Active Self  docusate sodium (COLACE) 100 MG capsule [id number] Yes Take 100 mg by mouth daily as needed for mild constipation. [provider]  Active Self  ELDERBERRY PO [id number] Yes Take 1 capsule by mouth daily. [provider]  Active Self  esomeprazole (NEXIUM) 20 MG packet [id number] Yes Take 20 mg by mouth daily as needed (acid reflux). [provider]  Active Self  metFORMIN  (GLUCOPHAGE -XR) 500 MG 24 hr tablet [id number] Yes Take 1 tablet (500 mg total) by mouth daily with breakfast for 7 days, THEN 1 tablet (500 mg total) 2 (two) times daily with a meal. TAKE 1 TABLET(500 MG) BY MOUTH DAILY WITH BREAKFAST. Narendra, Nischal, MD  Active Self  methocarbamol  (ROBAXIN ) 500 MG tablet [id number] Yes Take 1 tablet (500 mg total) by mouth every 6 (six) hours as needed for muscle spasms. Bergman, Meghan D, NP  Active   Multiple Vitamins-Minerals (MENS MULTIVITAMIN PO) [id number] Yes Take 1 tablet by mouth daily. [provider]  Active Self  oxyCODONE -acetaminophen  (PERCOCET) 10-325 MG tablet [id number] Yes Take 1 tablet by mouth every 4 (four) hours as needed for pain (Post operative pain). Bergman, Meghan D, NP  Active   polyethylene glycol (MIRALAX ) 17 g packet [id number] Yes Take 17 g by mouth daily. Bergman, Meghan D, NP  Active   rosuvastatin  (CRESTOR ) 10 MG tablet  [id number] Yes Take 1 tablet (10 mg total) by mouth daily. Narendra, Nischal, MD  Active Self            Goals Addressed             This Visit's Progress    VBCI Transitions of Care (TOC) Care Plan       Problems:  Recent Hospitalization for treatment of cervical fusion-   No Hospital Follow Up Provider appointment primary care provider visit scheduled for 07/31/24 Constipation issue resolved  Goal:  Over the next 30 days, the patient will not experience hospital readmission  Interventions:  Transitions of Care: Assessed healing of incision Reviewed Signs and symptoms of infection Assessed for signs of infection at incision site.  Evaluation of current treatment plan related to cervical surgery reviewed medications with patient and addressed questions reviewed scheduled provider appointments with patientPCP and surgeon Assessed constipation status Assessed patients ability to eat solid food and assessed if having ongoing issues with swallowing.  Advised patient that if he has any shortness of breath, increased swelling or unable to swallow to call 911.  Confirmed patients follow up visit date with surgeon- scheduled for 07/21/24 Assessed pain level.   Advised to take pain medication as prescribed.  Reminded patient not to drive when taking his prescribed pain medication   Patient Self Care Activities:  Call pharmacy for medication refills 3-7 days in advance of running out of medications Call provider office for new concerns or questions  Notify RN Care Manager of TOC call rescheduling needs Take medications as prescribed   Continue to participate in the 30 day TOC program Drink plenty of water Exercise/Walk as per provider recommendation Call MD for any signs of infection. Call 911 for difficulty breathing or swallowing Do not drive when taking your prescribed pain medication  Plan:  Telephone follow up appointment with care management team member scheduled for:   07/11/2024 11am   The patient has been provided with contact information for the care management team and has been advised to call with any health related questions or concerns.          Recommendation:   Continue Current Plan of Care  Follow Up Plan:   Telephone follow-up in 1 week 07/11/24 at 11 am  Arvin Seip RN, BSN, CCM [employer], Population Health Case Manager Phone: [phone]

## 2024-07-11 ENCOUNTER — Other Ambulatory Visit: Payer: Self-pay

## 2024-07-11 NOTE — Patient Instructions (Signed)
 Visit Information  Thank you for taking time to visit with me today. Please don't hesitate to contact me if I can be of assistance to you before our next scheduled telephone appointment.  Our next appointment is by telephone on 07/20/2024 at 11 am  Following is a copy of your care plan:   Goals Addressed             This Visit's Progress    VBCI Transitions of Care (TOC) Care Plan       Problems:  Recent Hospitalization for treatment of cervical fusion-   No Hospital Follow Up Provider appointment primary care provider visit scheduled for 07/31/24 Constipation issue resolved  Goal:  Over the next 30 days, the patient will not experience hospital readmission  Interventions:  Transitions of Care: Assessed healing of incision Reviewed Signs and symptoms of infection Assessed for signs of infection at incision site.  Evaluation of current treatment plan related to cervical surgery reviewed medications with patient and addressed questions reviewed scheduled provider appointments with patientPCP and surgeon Assessed patients ability to eat solid food and assessed if having ongoing issues with swallowing.  Advised patient that if he has any shortness of breath, increased swelling or unable to swallow to call 911.  Confirmed patients follow up visit date with surgeon- scheduled for 07/19/24 Assessed pain level.      Patient Self Care Activities:  Call pharmacy for medication refills 3-7 days in advance of running out of medications Call provider office for new concerns or questions  Notify RN Care Manager of TOC call rescheduling needs Take medications as prescribed   Continue to participate in the 30 day TOC program Drink plenty of water Exercise/Walk as per provider recommendation Call MD for any signs of infection. Call 911 for difficulty breathing or swallowing Do not drive when taking your prescribed pain medication  Plan:  Telephone follow up appointment with care  management team member scheduled for:  07/20/2024 11am   The patient has been provided with contact information for the care management team and has been advised to call with any health related questions or concerns.         Patient verbalizes understanding of instructions and care plan provided today and agrees to view in MyChart. Active MyChart status and patient understanding of how to access instructions and care plan via MyChart confirmed with patient.     The patient has been provided with contact information for the care management team and has been advised to call with any health related questions or concerns.   Please call the care guide team at [phone] if you need to cancel or reschedule your appointment.   Please call the Suicide and Crisis Lifeline: 988 call the [place]  National Suicide Prevention Lifeline: 7784770673 or TTY: (249) 326-9788 TTY 681-827-7416) to talk to a trained counselor call 1-800-273-TALK (toll free, 24 hour hotline) if you are experiencing a Mental Health or Behavioral Health Crisis or need someone to talk to.  Arvin Seip RN, BSN, CCM [employer], Population Health Case Manager Phone: [phone]

## 2024-07-11 NOTE — Transitions of Care (Post Inpatient/ED Visit) (Signed)
 Transition of Care week 4  Visit Note  07/11/2024  Name: Eric Owen. MRN: [id number]          DOB: [birth year]  Situation: Patient enrolled in [facility] 30-day program. Visit completed with patient by telephone.   Background: Patient hospitalized from 06/08/24 to 06/09/24 for cervical fusion      Past Medical History:  Diagnosis Date   Arthritis    Blood transfusion    Diabetes mellitus without complication (HCC)    type 2   GERD (gastroesophageal reflux disease)    Hypertension    Peptic ulcer     Assessment: Patient Reported Symptoms: Cognitive Cognitive Status: No symptoms reported, Alert and oriented to person, place, and time, Normal speech and language skills, Insightful and able to interpret abstract concepts      Neurological Neurological Review of Symptoms: No symptoms reported Neurological Comment: Patient states he is doing well post cervical spinal fusion.  He states he is feeling much better.  Denies any pain. Patient states he has progressed to solid foods fully and is swallowing without difficulty. Patient reports next follow up visit with surgeon is on 07/19/24.  HEENT HEENT Symptoms Reported: No symptoms reported      Cardiovascular Cardiovascular Symptoms Reported: No symptoms reported    Respiratory Respiratory Symptoms Reported: No symptoms reported    Endocrine Endocrine Symptoms Reported: No symptoms reported    Gastrointestinal Gastrointestinal Symptoms Reported: No symptoms reported      Genitourinary Genitourinary Symptoms Reported: No symptoms reported    Integumentary Integumentary Symptoms Reported: Incision Additional Integumentary Details: patient reports incision post cervical spinal fusion has healed.    Musculoskeletal Musculoskelatal Symptoms Reviewed: No symptoms reported        Psychosocial Psychosocial Symptoms Reported: No symptoms reported         There were no vitals filed for this visit.  Medications Reviewed Today      Reviewed by Dreshaun Stene E, RN (Registered Nurse) on 07/11/24 at 1134  Med List Status: <None>   Medication Order Taking? Sig Documenting Provider Last Dose Status Informant  cholecalciferol (VITAMIN D3) 25 MCG (1000 UNIT) tablet [id number] Yes Take 1 tablet (1,000 Units total) by mouth daily. Britta King, MD  Active Self  docusate sodium (COLACE) 100 MG capsule [id number] Yes Take 100 mg by mouth daily as needed for mild constipation. [provider]  Active Self  ELDERBERRY PO [id number] Yes Take 1 capsule by mouth daily. [provider]  Active Self  esomeprazole (NEXIUM) 20 MG packet [id number] Yes Take 20 mg by mouth daily as needed (acid reflux). [provider]  Active Self  metFORMIN  (GLUCOPHAGE -XR) 500 MG 24 hr tablet [id number] Yes Take 1 tablet (500 mg total) by mouth daily with breakfast for 7 days, THEN 1 tablet (500 mg total) 2 (two) times daily with a meal. TAKE 1 TABLET(500 MG) BY MOUTH DAILY WITH BREAKFAST. Narendra, Nischal, MD  Active Self  methocarbamol  (ROBAXIN ) 500 MG tablet [id number] Yes Take 1 tablet (500 mg total) by mouth every 6 (six) hours as needed for muscle spasms. Bergman, Meghan D, NP  Active   Multiple Vitamins-Minerals (MENS MULTIVITAMIN PO) [id number] Yes Take 1 tablet by mouth daily. [provider]  Active Self  oxyCODONE -acetaminophen  (PERCOCET) 10-325 MG tablet [id number] Yes Take 1 tablet by mouth every 4 (four) hours as needed for pain (Post operative pain). Bergman, Meghan D, NP  Active   polyethylene glycol (MIRALAX ) 17 g packet [id number] Yes Take 17 g by mouth daily.  Bergman, Meghan D, NP  Active   rosuvastatin  (CRESTOR ) 10 MG tablet [id number] Yes Take 1 tablet (10 mg total) by mouth daily. Narendra, Nischal, MD  Active Self            Goals Addressed             This Visit's Progress    VBCI Transitions of Care (TOC) Care Plan       Problems:  Recent Hospitalization for treatment of cervical fusion-   No  Hospital Follow Up Provider appointment primary care provider visit scheduled for 07/31/24 Constipation issue resolved  Goal:  Over the next 30 days, the patient will not experience hospital readmission  Interventions:  Transitions of Care: Assessed healing of incision Reviewed Signs and symptoms of infection Assessed for signs of infection at incision site.  Evaluation of current treatment plan related to cervical surgery reviewed medications with patient and addressed questions reviewed scheduled provider appointments with patientPCP and surgeon Assessed patients ability to eat solid food and assessed if having ongoing issues with swallowing.  Advised patient that if he has any shortness of breath, increased swelling or unable to swallow to call 911.  Confirmed patients follow up visit date with surgeon- scheduled for 07/19/24 Assessed pain level.      Patient Self Care Activities:  Call pharmacy for medication refills 3-7 days in advance of running out of medications Call provider office for new concerns or questions  Notify RN Care Manager of TOC call rescheduling needs Take medications as prescribed   Continue to participate in the 30 day TOC program Drink plenty of water Exercise/Walk as per provider recommendation Call MD for any signs of infection. Call 911 for difficulty breathing or swallowing Do not drive when taking your prescribed pain medication  Plan:  Telephone follow up appointment with care management team member scheduled for:  07/20/2024 11am   The patient has been provided with contact information for the care management team and has been advised to call with any health related questions or concerns.          Recommendation:   Continue Current Plan of Care  Follow Up Plan:   Telephone follow-up in 1 week 07/20/2024 at 11 am  Arvin Seip RN, BSN, CCM [employer], Population Health Case Manager Phone:  [phone]

## 2024-07-20 ENCOUNTER — Other Ambulatory Visit: Payer: Self-pay

## 2024-07-20 NOTE — Transitions of Care (Post Inpatient/ED Visit) (Signed)
 Transition of Care week #5  Visit Note  07/20/2024  Name: Eric Owen. MRN: [id number]          DOB: 09-Oct-1966  Situation: Patient enrolled in [facility] 30-day program. Visit completed with patient by telephone.   Background: Patient hospitalized from 06/08/24 to 06/09/24 for cervical fusion      Past Medical History:  Diagnosis Date   Arthritis    Blood transfusion    Diabetes mellitus without complication (HCC)    type 2   GERD (gastroesophageal reflux disease)    Hypertension    Peptic ulcer     Assessment: Patient Reported Symptoms: Cognitive Cognitive Status: Alert and oriented to person, place, and time, Insightful and able to interpret abstract concepts, Normal speech and language skills      Neurological Neurological Review of Symptoms: Other: [place] Neurological Symptoms/Conditions [RPT]: patient states he is doing well post cervical spinal fusion.  He reports having a follow up visit with the surgeon.  He states the surgeon did xrays which showed bone growth was lacking. Patient states surgeon's plan is to order a bone stimulator. Patient states he has been contacted by the company and is now awaiting approval from his insurance company. Patient denies any new concerns, pain or issues at this time. Neurological Management Strategies: Medical device, Routine screening  HEENT HEENT Symptoms Reported: No symptoms reported      Cardiovascular Cardiovascular Symptoms Reported: No symptoms reported    Respiratory Respiratory Symptoms Reported: No symptoms reported    Endocrine Endocrine Symptoms Reported: No symptoms reported    Gastrointestinal Gastrointestinal Symptoms Reported: No symptoms reported      Genitourinary Genitourinary Symptoms Reported: No symptoms reported    Integumentary Integumentary Symptoms Reported: Incision Additional Integumentary Details: patient states incision has healed nicely    Musculoskeletal Musculoskelatal Symptoms Reviewed: No  symptoms reported        Psychosocial Psychosocial Symptoms Reported: No symptoms reported         There were no vitals filed for this visit.  Medications Reviewed Today     Reviewed by Lyrical Sowle E, RN (Registered Nurse) on 07/20/24 at 1050  Med List Status: <None>   Medication Order Taking? Sig Documenting Provider Last Dose Status Informant  cholecalciferol (VITAMIN D3) 25 MCG (1000 UNIT) tablet [id number] Yes Take 1 tablet (1,000 Units total) by mouth daily. Britta King, MD  Active Self  docusate sodium (COLACE) 100 MG capsule [id number] Yes Take 100 mg by mouth daily as needed for mild constipation. [provider]  Active Self  ELDERBERRY PO [id number] Yes Take 1 capsule by mouth daily. [provider]  Active Self  esomeprazole (NEXIUM) 20 MG packet [id number] Yes Take 20 mg by mouth daily as needed (acid reflux). [provider]  Active Self  metFORMIN  (GLUCOPHAGE -XR) 500 MG 24 hr tablet [id number] Yes Take 1 tablet (500 mg total) by mouth daily with breakfast for 7 days, THEN 1 tablet (500 mg total) 2 (two) times daily with a meal. TAKE 1 TABLET(500 MG) BY MOUTH DAILY WITH BREAKFAST. Narendra, Nischal, MD  Active Self  methocarbamol  (ROBAXIN ) 500 MG tablet [id number] Yes Take 1 tablet (500 mg total) by mouth every 6 (six) hours as needed for muscle spasms. Bergman, Meghan D, NP  Active   Multiple Vitamins-Minerals (MENS MULTIVITAMIN PO) [id number] Yes Take 1 tablet by mouth daily. [provider]  Active Self  oxyCODONE -acetaminophen  (PERCOCET) 10-325 MG tablet [id number] Yes Take 1 tablet by mouth every 4 (four) hours as needed  for pain (Post operative pain). Bergman, Meghan D, NP  Active   polyethylene glycol (MIRALAX ) 17 g packet [id number] Yes Take 17 g by mouth daily. Bergman, Meghan D, NP  Active   rosuvastatin  (CRESTOR ) 10 MG tablet [id number] Yes Take 1 tablet (10 mg total) by mouth daily. Narendra, Nischal, MD  Active Self             Goals Addressed             This Visit's Progress    COMPLETED: VBCI Transitions of Care (TOC) Care Plan       Goals met.  Patient successfully completed 30 day TOC program Problems:  Recent Hospitalization for treatment of cervical fusion-   No Hospital Follow Up Provider appointment primary care provider visit scheduled for 07/31/24 Constipation issue resolved  Goal:  Over the next 30 days, the patient will not experience hospital readmission  Interventions:  Transitions of Care: Assessed healing of incision Assessed for signs of infection at incision site.  Evaluation of current treatment plan related to cervical surgery reviewed medications with patient and addressed questions reviewed scheduled provider appointments with patientPCP and surgeon Assessed pain level.   Discussed and offered CCM longitudinal case management follow up- patient declined.     Patient Self Care Activities:  Call pharmacy for medication refills 3-7 days in advance of running out of medications Call provider office for new concerns or questions  Take medications as prescribed   Call MD for any signs of infection.  Plan:  No further follow up required: patient successfully completed the 30 day TOC program.        Recommendation:   Continue Current Plan of Care  Follow Up Plan:   Closing From:  Transitions of Care Program  Arvin Seip RN, BSN, CCM [facility]  [facility], Population Health Case Manager Phone: [phone]

## 2024-07-20 NOTE — Patient Instructions (Signed)
 Visit Information  Thank you for taking time to visit with me today.   You have successfully completed the 30 day TOC program. Please contact your provider if you have further needs or concerns.    Following is a copy of your care plan:   Goals Addressed             This Visit's Progress    COMPLETED: VBCI Transitions of Care (TOC) Care Plan       Goals met.  Patient successfully completed 30 day TOC program Problems:  Recent Hospitalization for treatment of cervical fusion-   No Hospital Follow Up Provider appointment primary care provider visit scheduled for 07/31/24 Constipation issue resolved  Goal:  Over the next 30 days, the patient will not experience hospital readmission  Interventions:  Transitions of Care: Assessed healing of incision Assessed for signs of infection at incision site.  Evaluation of current treatment plan related to cervical surgery reviewed medications with patient and addressed questions reviewed scheduled provider appointments with patientPCP and surgeon Assessed pain level.   Discussed and offered CCM longitudinal case management follow up- patient declined.     Patient Self Care Activities:  Call pharmacy for medication refills 3-7 days in advance of running out of medications Call provider office for new concerns or questions  Take medications as prescribed   Call MD for any signs of infection.  Plan:  No further follow up required: patient successfully completed the 30 day TOC program.        Patient verbalizes understanding of instructions and care plan provided today and agrees to view in MyChart. Active MyChart status and patient understanding of how to access instructions and care plan via MyChart confirmed with patient.     No further follow up required:    Please call the care guide team at [phone] if you need to cancel or reschedule your appointment.   Please call the Suicide and Crisis Lifeline: 988 call the [place]   National Suicide Prevention Lifeline: 443-748-5034 or TTY: 403-012-2214 TTY 763-228-7620) to talk to a trained counselor call 1-800-273-TALK (toll free, 24 hour hotline) if you are experiencing a Mental Health or Behavioral Health Crisis or need someone to talk to.  Arvin Seip RN, BSN, CCM [employer], Population Health Case Manager Phone: [phone]

## 2024-07-27 ENCOUNTER — Other Ambulatory Visit (INDEPENDENT_AMBULATORY_CARE_PROVIDER_SITE_OTHER)

## 2024-07-27 DIAGNOSIS — E782 Mixed hyperlipidemia: Secondary | ICD-10-CM

## 2024-07-27 DIAGNOSIS — Z125 Encounter for screening for malignant neoplasm of prostate: Secondary | ICD-10-CM | POA: Diagnosis not present

## 2024-07-27 LAB — LIPID PANEL
Cholesterol: 187 mg/dL (ref 0–200)
HDL: 27.7 mg/dL — ABNORMAL LOW (ref >39.00)
NonHDL: 158.85
Total CHOL/HDL Ratio: 7
Triglycerides: 530 mg/dL — ABNORMAL HIGH (ref 0.0–149.0)
VLDL: 106 mg/dL — ABNORMAL HIGH (ref 0.0–40.0)

## 2024-07-27 LAB — PSA: PSA: 0.29 ng/mL (ref 0.10–4.00)

## 2024-07-27 LAB — LDL CHOLESTEROL, DIRECT: Direct LDL: 53 mg/dL

## 2024-07-31 ENCOUNTER — Ambulatory Visit: Admitting: Nurse Practitioner

## 2024-07-31 VITALS — BP 120/74 | HR 75 | Temp 98.0°F | Ht 72.0 in | Wt 183.2 lb

## 2024-07-31 DIAGNOSIS — E1169 Type 2 diabetes mellitus with other specified complication: Secondary | ICD-10-CM | POA: Diagnosis not present

## 2024-07-31 DIAGNOSIS — E781 Pure hyperglyceridemia: Secondary | ICD-10-CM

## 2024-07-31 DIAGNOSIS — Z7984 Long term (current) use of oral hypoglycemic drugs: Secondary | ICD-10-CM | POA: Diagnosis not present

## 2024-07-31 MED ORDER — FENOFIBRATE 145 MG PO TABS: 145.0000 mg | ORAL_TABLET | Freq: Every day | ORAL | 2 refills | Status: AC

## 2024-07-31 NOTE — Patient Instructions (Addendum)
 Your triglycerides have improved but are still high, and your HDL cholesterol is low. -We are prescribing fenofibrate  145 mg to help manage your triglycerides. -Continue regular exercise, including physical therapy. -We will recheck your lipid panel  and A1c in five weeks.

## 2024-07-31 NOTE — Progress Notes (Signed)
 Established Patient Office Visit  Subjective:  Patient ID: Eric Owen., male    DOB: [birth year]  Age: 58 y.o. MRN: [id number]  CC:  Chief Complaint  Patient presents with   Follow-up   Discussed the use of a AI scribe software for clinical note transcription with the patient, who gave verbal consent to proceed.  HPI  Eric Owen. is a 58 year old male with diabetes and hyperlipidemia who presents for follow-up on A1c and cholesterol levels.  Triglycerides have decreased from 774 mg/dL in July to 469 mg/dL in October but remain elevated. He is currently on Crestor  for cholesterol management. For diabetes, he takes metformin  500 mg twice daily, which he resumed after surgery. Metformin  reduces his appetite, contributing to weight loss. His weight was 190 lbs in August, dropped to 174 lbs post-surgery, and is now 183 lbs. His A1c was last recorded at 7.5% before surgery.  He underwent surgery four weeks ago, affecting his ability to eat and swallow, leading to weight loss. He is attending physical therapy and has started exercising.  HPI   Past Medical History:  Diagnosis Date   Arthritis    Blood transfusion    Diabetes mellitus without complication (HCC)    type 2   GERD (gastroesophageal reflux disease)    Hypertension    Peptic ulcer     Past Surgical History:  Procedure Laterality Date   ANTERIOR CERVICAL DECOMP/DISCECTOMY FUSION     2 neck fusions prior to 04/30/24   ANTERIOR CERVICAL DECOMPRESSION/DISCECTOMY FUSION 4 LEVELS N/A 06/08/2024   Procedure: Anterior Cervical Discectomy and Fusion Cervical two-three, Cervical three-four, Cervical four-five, Cervical five-six;  Surgeon: Joshua Alm Hamilton, MD;  Location: [facility] OR;  Service: Neurosurgery;  Laterality: N/A;   BUNIONECTOMY     CARPAL TUNNEL RELEASE     right   CERVICAL FUSION     COLONOSCOPY N/A 03/04/2015   Procedure: COLONOSCOPY;  Surgeon: Louanne KANDICE Muse, MD;  Location: [facility] ENDOSCOPY;  Service:  Endoscopy;  Laterality: N/A;   HAMMER TOE SURGERY     POSTERIOR CERVICAL FUSION/FORAMINOTOMY  03/16/2012   Procedure: POSTERIOR CERVICAL FUSION/FORAMINOTOMY LEVEL 1;  Surgeon: Alm GORMAN Joshua, MD;  Location: [facility] OR[facility];  Service: Neurosurgery;  Laterality: N/A;  Cervical six-seven posterior cervical fusion with lateral mass screws   SPINE SURGERY      Family History  Problem Relation Age of Onset   Arthritis Mother    Heart disease Mother    Diabetes Sister     Social History   Socioeconomic History   Marital status: Married    Spouse name: Not on file   Number of children: 4   Years of education: Not on file   Highest education level: Associate degree: occupational, scientist, product/process development, or vocational program  Occupational History   Not on file  Tobacco Use   Smoking status: Former    Current packs/day: 0.00    Average packs/day: 0.4 packs/day for 18.0 years (7.2 ttl pk-yrs)    Types: Cigarettes    Start date: 01/28/1988    Quit date: 04/23/2006    Years since quitting: 18.3   Smokeless tobacco: Never  Vaping Use   Vaping status: Never Used  Substance and Sexual Activity   Alcohol use: Yes    Alcohol/week: 2.0 standard drinks of alcohol    Types: 2 Glasses of wine per week    Comment: maybe once a week, occasionally   Drug use: No   Sexual activity: Yes  Birth control/protection: None  Other Topics Concern   Not on file  Social History Narrative   Married lives with wife. 3 sons lives in [place] and 1 daughter in [place]   Social Drivers of Health   Financial Resource Strain: Low Risk  (07/31/2024)   Overall Financial Resource Strain (CARDIA)    Difficulty of Paying Living Expenses: Not hard at all  Food Insecurity: No Food Insecurity (07/31/2024)   Hunger Vital Sign    Worried About Running Out of Food in the Last Year: Never true    Ran Out of Food in the Last Year: Never true  Transportation Needs: No Transportation Needs (07/31/2024)   PRAPARE - Scientist, Research (physical Sciences) (Medical): No    Lack of Transportation (Non-Medical): No  Physical Activity: Insufficiently Active (07/31/2024)   Exercise Vital Sign    Days of Exercise per Week: 2 days    Minutes of Exercise per Session: 10 min  Stress: No Stress Concern Present (07/31/2024)   [employer] of Occupational Health - Occupational Stress Questionnaire    Feeling of Stress: Not at all  Social Connections: Socially Integrated (07/31/2024)   Social Connection and Isolation Panel    Frequency of Communication with Friends and Family: More than three times a week    Frequency of Social Gatherings with Friends and Family: Three times a week    Attends Religious Services: More than 4 times per year    Active Member of Clubs or Organizations: Yes    Attends Banker Meetings: More than 4 times per year    Marital Status: Married  Catering Manager Violence: Not At Risk (06/11/2024)   Humiliation, Afraid, Rape, and Kick questionnaire    Fear of Current or Ex-Partner: No    Emotionally Abused: No    Physically Abused: No    Sexually Abused: No     Outpatient Medications Prior to Visit  Medication Sig Dispense Refill   cholecalciferol (VITAMIN D3) 25 MCG (1000 UNIT) tablet Take 1 tablet (1,000 Units total) by mouth daily. 90 tablet 3   docusate sodium (COLACE) 100 MG capsule Take 100 mg by mouth daily as needed for mild constipation.     ELDERBERRY PO Take 1 capsule by mouth daily.     esomeprazole (NEXIUM) 20 MG packet Take 20 mg by mouth daily as needed (acid reflux).     metFORMIN  (GLUCOPHAGE -XR) 500 MG 24 hr tablet Take 1 tablet (500 mg total) by mouth daily with breakfast for 7 days, THEN 1 tablet (500 mg total) 2 (two) times daily with a meal. TAKE 1 TABLET(500 MG) BY MOUTH DAILY WITH BREAKFAST. 90 tablet 0   methocarbamol  (ROBAXIN ) 500 MG tablet Take 1 tablet (500 mg total) by mouth every 6 (six) hours as needed for muscle spasms. 30 tablet 1   Multiple Vitamins-Minerals  (MENS MULTIVITAMIN PO) Take 1 tablet by mouth daily.     oxyCODONE -acetaminophen  (PERCOCET) 5-325 MG tablet Take by mouth.     polyethylene glycol (MIRALAX ) 17 g packet Take 17 g by mouth daily. 14 each 0   rosuvastatin  (CRESTOR ) 10 MG tablet Take 1 tablet (10 mg total) by mouth daily. 90 tablet 3   oxyCODONE -acetaminophen  (PERCOCET) 10-325 MG tablet Take 1 tablet by mouth every 4 (four) hours as needed for pain (Post operative pain). (Patient not taking: Reported on 07/31/2024) 42 tablet 0   No facility-administered medications prior to visit.    Allergies  Allergen Reactions   Aspirin Nausea  Only    ROS Review of Systems Negative unless indicated in HPI.    Objective:    Physical Exam Constitutional:      Appearance: Normal appearance.  HENT:     Mouth/Throat:     Mouth: Mucous membranes are moist.  Eyes:     Conjunctiva/sclera: Conjunctivae normal.     Pupils: Pupils are equal, round, and reactive to light.  Cardiovascular:     Rate and Rhythm: Normal rate and regular rhythm.     Pulses: Normal pulses.     Heart sounds: Normal heart sounds.  Pulmonary:     Effort: Pulmonary effort is normal.     Breath sounds: Normal breath sounds.  Musculoskeletal:     Cervical back: Normal range of motion. No tenderness.  Neurological:     General: No focal deficit present.     Mental Status: He is alert and oriented to person, place, and time. Mental status is at baseline.  Psychiatric:        Mood and Affect: Mood normal.        Behavior: Behavior normal.        Thought Content: Thought content normal.        Judgment: Judgment normal.     BP 120/74   Pulse 75   Temp 98 F (36.7 C)   Ht 6' (1.829 m)   Wt 183 lb 3.2 oz (83.1 kg)   SpO2 97%   BMI 24.85 kg/m  Wt Readings from Last 3 Encounters:  07/31/24 183 lb 3.2 oz (83.1 kg)  06/08/24 194 lb (88 kg)  06/07/24 193 lb (87.5 kg)     Health Maintenance  Topic Date Due   FOOT EXAM  Never done   Pneumococcal  Vaccine: 50+ Years (1 of 2 - PCV) Never done   Hepatitis B Vaccines 19-59 Average Risk (1 of 3 - 19+ 3-dose series) Never done   Zoster Vaccines- Shingrix (1 of 2) Never done   COVID-19 Vaccine (4 - 2025-26 season) 08/16/2024 (Originally 06/18/2024)   Influenza Vaccine  01/15/2025 (Originally 05/18/2024)   HEMOGLOBIN A1C  12/08/2024   OPHTHALMOLOGY EXAM  03/03/2025   Colonoscopy  03/03/2025   Diabetic kidney evaluation - Urine ACR  05/07/2025   Diabetic kidney evaluation - eGFR measurement  06/07/2025   DTaP/Tdap/Td (3 - Td or Tdap) 07/26/2032   Hepatitis C Screening  Completed   HIV Screening  Completed   HPV VACCINES  Aged Out   Meningococcal B Vaccine  Aged Out       Topic Date Due   Hepatitis B Vaccines 19-59 Average Risk (1 of 3 - 19+ 3-dose series) Never done    Lab Results  Component Value Date   TSH 2.18 05/07/2020   Lab Results  Component Value Date   WBC 6.4 06/07/2024   HGB 13.8 06/07/2024   HCT 39.4 06/07/2024   MCV 90.2 06/07/2024   PLT 257 06/07/2024   Lab Results  Component Value Date   NA 139 06/07/2024   K 3.8 06/07/2024   CO2 27 06/07/2024   GLUCOSE 155 (H) 06/07/2024   BUN 7 06/07/2024   CREATININE 0.77 06/07/2024   BILITOT 0.7 09/14/2022   AST 31 09/14/2022   ALT 35 09/14/2022   PROT 7.6 09/14/2022   CALCIUM  9.7 06/07/2024   ANIONGAP 12 06/07/2024   EGFR 87 09/14/2022   Lab Results  Component Value Date   CHOL 187 07/27/2024   Lab Results  Component Value Date  HDL 27.70 (L) 07/27/2024   Lab Results  Component Value Date   LDLCALC 124 (H) 09/14/2022   Lab Results  Component Value Date   TRIG (H) 07/27/2024    530.0 Triglyceride is over 400; calculations on Lipids are invalid.   Lab Results  Component Value Date   CHOLHDL 7 07/27/2024   Lab Results  Component Value Date   HGBA1C 7.5 (H) 06/07/2024      Assessment & Plan:  Hypertriglyceridemia Assessment & Plan: Triglycerides at 530, improved from 774 but still elevated.  Low HDL at 27. Not currently on fenofibrate . - Prescribe fenofibrate  160 mg. - Encourage regular exercise, including physical therapy. - Recheck lipid panel in five weeks.  Orders: -     Lipid panel; Future -     Fenofibrate ; Take 1 tablet (145 mg total) by mouth daily.  Dispense: 90 tablet; Refill: 2  Type 2 diabetes mellitus with other specified complication, without long-term current use of insulin  [facility]) Assessment & Plan:  Lab Results  Component Value Date   HGBA1C 7.5 (H) 06/07/2024  -Continue metformin  500 mg twice a day. - Advised on lifestyle interventions and diet management. - Will continue to monitor.   Orders: -     Hemoglobin A1c; Future    Follow-up: Return in about 6 weeks (around 09/11/2024) for Fasting lab .   Carleah Yablonski, NP

## 2024-08-13 DIAGNOSIS — E781 Pure hyperglyceridemia: Secondary | ICD-10-CM | POA: Insufficient documentation

## 2024-08-13 NOTE — Assessment & Plan Note (Signed)
 Triglycerides at 530, improved from 774 but still elevated. Low HDL at 27. Not currently on fenofibrate . - Prescribe fenofibrate  160 mg. - Encourage regular exercise, including physical therapy. - Recheck lipid panel in five weeks.

## 2024-08-13 NOTE — Assessment & Plan Note (Signed)
  Lab Results  Component Value Date   HGBA1C 7.5 (H) 06/07/2024  -Continue metformin  500 mg twice a day. - Advised on lifestyle interventions and diet management. - Will continue to monitor.

## 2024-09-11 ENCOUNTER — Other Ambulatory Visit

## 2024-09-11 DIAGNOSIS — E781 Pure hyperglyceridemia: Secondary | ICD-10-CM

## 2024-09-11 DIAGNOSIS — E1169 Type 2 diabetes mellitus with other specified complication: Secondary | ICD-10-CM | POA: Diagnosis not present

## 2024-09-11 LAB — LIPID PANEL
Cholesterol: 155 mg/dL (ref 0–200)
HDL: 29.3 mg/dL — ABNORMAL LOW (ref >39.00)
LDL Cholesterol: 58 mg/dL (ref 0–99)
NonHDL: 125.4
Total CHOL/HDL Ratio: 5
Triglycerides: 339 mg/dL — ABNORMAL HIGH (ref 0.0–149.0)
VLDL: 67.8 mg/dL — ABNORMAL HIGH (ref 0.0–40.0)

## 2024-09-11 LAB — HEMOGLOBIN A1C: Hgb A1c MFr Bld: 7.7 % — ABNORMAL HIGH (ref 4.6–6.5)

## 2024-09-12 ENCOUNTER — Ambulatory Visit: Payer: Self-pay | Admitting: Nurse Practitioner

## 2024-09-12 DIAGNOSIS — E1169 Type 2 diabetes mellitus with other specified complication: Secondary | ICD-10-CM

## 2024-09-12 DIAGNOSIS — E781 Pure hyperglyceridemia: Secondary | ICD-10-CM

## 2024-09-12 NOTE — Progress Notes (Signed)
 Please inform patient: Hemoglobin A1c has increased from 7.5 to 7.7.  Continue metformin  twice a day.  Would recommend dietary changes and exercise. Triglycerides improved from 530 to 339.  Continue fenofibrate  and Crestor .  Please schedule patient for office visit in 3 months and fasting labs prior to the office visit.  Labs has been ordered.

## 2024-09-17 ENCOUNTER — Telehealth: Payer: Self-pay | Admitting: Nurse Practitioner

## 2024-09-17 ENCOUNTER — Telehealth: Payer: Self-pay

## 2024-09-17 DIAGNOSIS — E1169 Type 2 diabetes mellitus with other specified complication: Secondary | ICD-10-CM

## 2024-09-17 NOTE — Telephone Encounter (Unsigned)
 Copied from CRM #[id number]. Topic: Clinical - Medication Refill >> Sep 17, 2024  4:49 PM Sophia H wrote: Medication: metFORMIN  (GLUCOPHAGE -XR) 500 MG 24 hr tablet   Has the patient contacted their pharmacy? Yes, needs updated RX.   This is the patient's preferred pharmacy:  [facility] - [place], [place] - [address] [place] [address] Phone: [phone] Fax: [phone]  Is this the correct pharmacy for this prescription? Yes If no, delete pharmacy and type the correct one.   Has the prescription been filled recently? Yes  Is the patient out of the medication? Yes  Has the patient been seen for an appointment in the last year OR does the patient have an upcoming appointment? Yes, appt Feb 26.   Can we respond through MyChart? Yes  Agent: Please be advised that Rx refills may take up to 3 business days. We ask that you follow-up with your pharmacy.

## 2024-09-17 NOTE — Telephone Encounter (Signed)
 Copied from CRM #[id number]. Topic: Appointments - Appointment Scheduling >> Sep 17, 2024  4:43 PM Sophia H wrote: Patient/patient representative is calling to schedule an appointment. Refer to attachments for appointment information.   **JUST FYI Patient following up on labs - advised per chart note Hemoglobin A1c has increased from 7.5 to 7.7.  Continue metformin  twice a day.  Would recommend dietary changes and exercise. Triglycerides improved from 530 to 339.  Continue fenofibrate  and Crestor .   Please schedule patient for office visit in 3 months and fasting labs prior to the office visit.  Labs has been ordered. Patient verbalized understanding, refill requested for metformin .  78m follow up and fasting labs - ok to schedule per chart note from PCP.   Labs: Thursday Dec 06, 2024 Appt at 8:30 AM (15 min)  OV: Thursday Dec 13, 2024 Arrive by 9:45 AM Appt at 10:00 AM (20 min)

## 2024-09-18 ENCOUNTER — Other Ambulatory Visit (HOSPITAL_COMMUNITY): Payer: Self-pay

## 2024-09-18 ENCOUNTER — Other Ambulatory Visit: Payer: Self-pay

## 2024-09-18 ENCOUNTER — Telehealth: Payer: Self-pay

## 2024-09-18 DIAGNOSIS — E1169 Type 2 diabetes mellitus with other specified complication: Secondary | ICD-10-CM

## 2024-09-18 MED ORDER — METFORMIN HCL ER 500 MG PO TB24: 500.0000 mg | ORAL_TABLET | Freq: Two times a day (BID) | ORAL | 0 refills | Status: AC

## 2024-09-18 NOTE — Telephone Encounter (Signed)
 Pharmacy Patient Advocate Encounter   Received notification from Onbase that prior authorization for metFORMIN  HCI ER 500 MG ER TABLET is required/requested.   Insurance verification completed.   The patient is insured through [facility].   Per test claim: The current 30 day co-pay is, $1.71 PLAN LIMIT EXCEEDED PLAN WILL ONLY PAY FOR 30 DAY SUPPLY PLEASE BE ADVISED.  No PA needed at this time. This test claim was processed through [facility]- copay amounts may vary at other pharmacies due to pharmacy/plan contracts, or as the patient moves through the different stages of their insurance plan.

## 2024-09-18 NOTE — Telephone Encounter (Signed)
 Pended Metformin  for your review.

## 2024-09-18 NOTE — Telephone Encounter (Signed)
 Medication refilled

## 2024-12-06 ENCOUNTER — Other Ambulatory Visit

## 2024-12-13 ENCOUNTER — Ambulatory Visit: Admitting: Nurse Practitioner
